# Patient Record
Sex: Female | Born: 1945
Health system: Southern US, Community
[De-identification: ages and names within clinical notes are randomized; demographics above are authoritative.]

## PROBLEM LIST (undated history)

## (undated) DIAGNOSIS — M545 Low back pain, unspecified: Secondary | ICD-10-CM

## (undated) DIAGNOSIS — G40409 Other generalized epilepsy and epileptic syndromes, not intractable, without status epilepticus: Secondary | ICD-10-CM

## (undated) DIAGNOSIS — M1990 Unspecified osteoarthritis, unspecified site: Secondary | ICD-10-CM

## (undated) DIAGNOSIS — K219 Gastro-esophageal reflux disease without esophagitis: Secondary | ICD-10-CM

## (undated) DIAGNOSIS — I517 Cardiomegaly: Secondary | ICD-10-CM

## (undated) DIAGNOSIS — J42 Unspecified chronic bronchitis: Secondary | ICD-10-CM

## (undated) DIAGNOSIS — G4733 Obstructive sleep apnea (adult) (pediatric): Secondary | ICD-10-CM

## (undated) DIAGNOSIS — R51 Headache: Secondary | ICD-10-CM

## (undated) DIAGNOSIS — Z9989 Dependence on other enabling machines and devices: Secondary | ICD-10-CM

## (undated) DIAGNOSIS — I10 Essential (primary) hypertension: Secondary | ICD-10-CM

## (undated) DIAGNOSIS — E119 Type 2 diabetes mellitus without complications: Secondary | ICD-10-CM

## (undated) DIAGNOSIS — G8929 Other chronic pain: Secondary | ICD-10-CM

## (undated) HISTORY — PX: CATARACT EXTRACTION W/ INTRAOCULAR LENS  IMPLANT, BILATERAL: SHX1307

---

## 1969-02-08 HISTORY — PX: DILATION AND CURETTAGE OF UTERUS: SHX78

## 1969-02-08 HISTORY — PX: ABDOMINAL HYSTERECTOMY: SHX81

## 2010-07-18 ENCOUNTER — Other Ambulatory Visit (HOSPITAL_BASED_OUTPATIENT_CLINIC_OR_DEPARTMENT_OTHER): Payer: Self-pay | Admitting: Internal Medicine

## 2010-07-18 ENCOUNTER — Ambulatory Visit (HOSPITAL_BASED_OUTPATIENT_CLINIC_OR_DEPARTMENT_OTHER)
Admission: RE | Admit: 2010-07-18 | Discharge: 2010-07-18 | Disposition: A | Discharge status: Home / Self Care | Payer: Medicaid Other | Source: Ambulatory Visit | Attending: Internal Medicine | Admitting: Internal Medicine

## 2010-07-18 DIAGNOSIS — R079 Chest pain, unspecified: Secondary | ICD-10-CM | POA: Insufficient documentation

## 2010-07-18 DIAGNOSIS — I2789 Other specified pulmonary heart diseases: Secondary | ICD-10-CM | POA: Insufficient documentation

## 2010-07-18 DIAGNOSIS — R634 Abnormal weight loss: Secondary | ICD-10-CM | POA: Insufficient documentation

## 2010-07-18 DIAGNOSIS — R05 Cough: Secondary | ICD-10-CM

## 2010-07-18 DIAGNOSIS — R0602 Shortness of breath: Secondary | ICD-10-CM | POA: Insufficient documentation

## 2010-07-18 DIAGNOSIS — R059 Cough, unspecified: Secondary | ICD-10-CM | POA: Insufficient documentation

## 2010-07-18 MED ORDER — IOHEXOL 350 MG/ML SOLN
100.0000 mL | Freq: Once | INTRAVENOUS | Status: AC | PRN
  Administered 2010-07-18: 100 mL via INTRAVENOUS

## 2011-06-18 ENCOUNTER — Encounter: Payer: Self-pay | Admitting: *Deleted

## 2011-06-18 ENCOUNTER — Observation Stay (HOSPITAL_BASED_OUTPATIENT_CLINIC_OR_DEPARTMENT_OTHER)
Admission: EM | Admit: 2011-06-18 | Discharge: 2011-06-21 | Disposition: A | Discharge status: Home / Self Care | Payer: Self-pay | Source: Ambulatory Visit | Attending: Internal Medicine | Admitting: Internal Medicine

## 2011-06-18 ENCOUNTER — Emergency Department (INDEPENDENT_AMBULATORY_CARE_PROVIDER_SITE_OTHER): Payer: Self-pay

## 2011-06-18 DIAGNOSIS — R10814 Left lower quadrant abdominal tenderness: Secondary | ICD-10-CM | POA: Insufficient documentation

## 2011-06-18 DIAGNOSIS — N281 Cyst of kidney, acquired: Secondary | ICD-10-CM

## 2011-06-18 DIAGNOSIS — G8929 Other chronic pain: Secondary | ICD-10-CM | POA: Insufficient documentation

## 2011-06-18 DIAGNOSIS — M545 Low back pain, unspecified: Principal | ICD-10-CM | POA: Insufficient documentation

## 2011-06-18 DIAGNOSIS — G40909 Epilepsy, unspecified, not intractable, without status epilepticus: Secondary | ICD-10-CM | POA: Diagnosis present

## 2011-06-18 DIAGNOSIS — Z794 Long term (current) use of insulin: Secondary | ICD-10-CM | POA: Insufficient documentation

## 2011-06-18 DIAGNOSIS — R739 Hyperglycemia, unspecified: Secondary | ICD-10-CM

## 2011-06-18 DIAGNOSIS — M549 Dorsalgia, unspecified: Secondary | ICD-10-CM

## 2011-06-18 DIAGNOSIS — R1032 Left lower quadrant pain: Secondary | ICD-10-CM

## 2011-06-18 DIAGNOSIS — I10 Essential (primary) hypertension: Secondary | ICD-10-CM | POA: Insufficient documentation

## 2011-06-18 DIAGNOSIS — E119 Type 2 diabetes mellitus without complications: Secondary | ICD-10-CM | POA: Diagnosis present

## 2011-06-18 DIAGNOSIS — IMO0001 Reserved for inherently not codable concepts without codable children: Secondary | ICD-10-CM | POA: Insufficient documentation

## 2011-06-18 DIAGNOSIS — E669 Obesity, unspecified: Secondary | ICD-10-CM | POA: Insufficient documentation

## 2011-06-18 DIAGNOSIS — D649 Anemia, unspecified: Secondary | ICD-10-CM | POA: Insufficient documentation

## 2011-06-18 DIAGNOSIS — J9819 Other pulmonary collapse: Secondary | ICD-10-CM

## 2011-06-18 HISTORY — DX: Essential (primary) hypertension: I10

## 2011-06-18 LAB — COMPREHENSIVE METABOLIC PANEL
CO2: 26 mEq/L (ref 19–32)
Calcium: 9.7 mg/dL (ref 8.4–10.5)
Creatinine, Ser: 0.9 mg/dL (ref 0.50–1.10)
GFR calc Af Amer: 77 mL/min — ABNORMAL LOW (ref >90)
GFR calc non Af Amer: 67 mL/min — ABNORMAL LOW (ref >90)
Glucose, Bld: 243 mg/dL — ABNORMAL HIGH (ref 70–99)
Sodium: 137 mEq/L (ref 135–145)
Total Protein: 6.8 g/dL (ref 6.0–8.3)

## 2011-06-18 LAB — LIPASE, BLOOD: Lipase: 23 U/L (ref 11–59)

## 2011-06-18 LAB — DIFFERENTIAL
Eosinophils Absolute: 0.2 10*3/uL (ref 0.0–0.7)
Eosinophils Relative: 2 % (ref 0–5)
Lymphocytes Relative: 22 % (ref 12–46)
Lymphs Abs: 2.4 10*3/uL (ref 0.7–4.0)
Monocytes Absolute: 0.7 10*3/uL (ref 0.1–1.0)

## 2011-06-18 LAB — URINALYSIS, ROUTINE W REFLEX MICROSCOPIC
Ketones, ur: NEGATIVE mg/dL
Protein, ur: NEGATIVE mg/dL
Urobilinogen, UA: 0.2 mg/dL (ref 0.0–1.0)

## 2011-06-18 LAB — CBC
HCT: 38 % (ref 36.0–46.0)
MCH: 28.4 pg (ref 26.0–34.0)
MCV: 85.8 fL (ref 78.0–100.0)
Platelets: 270 10*3/uL (ref 150–400)
RDW: 14.4 % (ref 11.5–15.5)
WBC: 11.3 10*3/uL — ABNORMAL HIGH (ref 4.0–10.5)

## 2011-06-18 LAB — URINE MICROSCOPIC-ADD ON

## 2011-06-18 MED ORDER — MORPHINE SULFATE 4 MG/ML IJ SOLN
4.0000 mg | Freq: Once | INTRAMUSCULAR | Status: AC
  Administered 2011-06-18: 4 mg via INTRAVENOUS
  Filled 2011-06-18: qty 1

## 2011-06-18 MED ORDER — SODIUM CHLORIDE 0.9 % IV BOLUS (SEPSIS)
1000.0000 mL | Freq: Once | INTRAVENOUS | Status: AC
  Administered 2011-06-18: 1000 mL via INTRAVENOUS

## 2011-06-18 MED ORDER — IOHEXOL 300 MG/ML  SOLN
100.0000 mL | Freq: Once | INTRAMUSCULAR | Status: AC | PRN
  Administered 2011-06-18: 100 mL via INTRAVENOUS

## 2011-06-18 NOTE — ED Notes (Signed)
Patient states she has had left leg and left lower back pain for one week.  Has an appointment on 06/25/11 to be seen.  States pain became worse today.

## 2011-06-19 ENCOUNTER — Inpatient Hospital Stay (HOSPITAL_COMMUNITY): Payer: Self-pay

## 2011-06-19 ENCOUNTER — Encounter (HOSPITAL_COMMUNITY): Payer: Self-pay | Admitting: Internal Medicine

## 2011-06-19 DIAGNOSIS — G40909 Epilepsy, unspecified, not intractable, without status epilepticus: Secondary | ICD-10-CM | POA: Diagnosis present

## 2011-06-19 DIAGNOSIS — M549 Dorsalgia, unspecified: Secondary | ICD-10-CM

## 2011-06-19 DIAGNOSIS — E119 Type 2 diabetes mellitus without complications: Secondary | ICD-10-CM | POA: Diagnosis present

## 2011-06-19 LAB — COMPREHENSIVE METABOLIC PANEL
ALT: 10 U/L (ref 0–35)
AST: 13 U/L (ref 0–37)
Alkaline Phosphatase: 65 U/L (ref 39–117)
CO2: 25 mEq/L (ref 19–32)
Calcium: 9.3 mg/dL (ref 8.4–10.5)
Chloride: 106 mEq/L (ref 96–112)
GFR calc Af Amer: 83 mL/min — ABNORMAL LOW (ref >90)
GFR calc non Af Amer: 71 mL/min — ABNORMAL LOW (ref >90)
Glucose, Bld: 188 mg/dL — ABNORMAL HIGH (ref 70–99)
Potassium: 3.8 mEq/L (ref 3.5–5.1)
Sodium: 140 mEq/L (ref 135–145)
Total Bilirubin: 0.2 mg/dL — ABNORMAL LOW (ref 0.3–1.2)

## 2011-06-19 LAB — CBC
Hemoglobin: 12 g/dL (ref 12.0–15.0)
MCH: 28.6 pg (ref 26.0–34.0)
MCHC: 32.5 g/dL (ref 30.0–36.0)
RDW: 14.7 % (ref 11.5–15.5)

## 2011-06-19 LAB — GLUCOSE, CAPILLARY
Glucose-Capillary: 178 mg/dL — ABNORMAL HIGH (ref 70–99)
Glucose-Capillary: 105 mg/dL — ABNORMAL HIGH (ref 70–99)
Glucose-Capillary: 149 mg/dL — ABNORMAL HIGH (ref 70–99)

## 2011-06-19 LAB — TSH: TSH: 1.119 u[IU]/mL (ref 0.350–4.500)

## 2011-06-19 MED ORDER — MORPHINE SULFATE 4 MG/ML IJ SOLN
4.0000 mg | Freq: Once | INTRAMUSCULAR | Status: AC
  Administered 2011-06-19: 4 mg via INTRAVENOUS
  Filled 2011-06-19: qty 1

## 2011-06-19 MED ORDER — HYDROMORPHONE HCL PF 1 MG/ML IJ SOLN: 0.5000 mg | INTRAMUSCULAR | Status: DC | PRN

## 2011-06-19 MED ORDER — LEVETIRACETAM ER 500 MG PO TB24
500.0000 mg | ORAL_TABLET | Freq: Two times a day (BID) | ORAL | Status: DC
  Filled 2011-06-19 (×2): qty 1

## 2011-06-19 MED ORDER — INSULIN ASPART 100 UNIT/ML ~~LOC~~ SOLN
0.0000 [IU] | Freq: Three times a day (TID) | SUBCUTANEOUS | Status: DC
  Administered 2011-06-19: 2 [IU] via SUBCUTANEOUS
  Administered 2011-06-19 – 2011-06-20 (×3): 3 [IU] via SUBCUTANEOUS
  Administered 2011-06-21 (×2): 5 [IU] via SUBCUTANEOUS
  Filled 2011-06-19: qty 3

## 2011-06-19 MED ORDER — SODIUM CHLORIDE 0.9 % IJ SOLN
3.0000 mL | Freq: Two times a day (BID) | INTRAMUSCULAR | Status: DC
  Administered 2011-06-19 – 2011-06-21 (×4): 3 mL via INTRAVENOUS

## 2011-06-19 MED ORDER — ACETAMINOPHEN 325 MG PO TABS
650.0000 mg | ORAL_TABLET | Freq: Four times a day (QID) | ORAL | Status: DC | PRN
  Administered 2011-06-20 – 2011-06-21 (×3): 650 mg via ORAL
  Filled 2011-06-19 (×3): qty 2

## 2011-06-19 MED ORDER — OXYCODONE HCL 5 MG PO TABS
5.0000 mg | ORAL_TABLET | ORAL | Status: DC | PRN
  Administered 2011-06-19 – 2011-06-21 (×7): 5 mg via ORAL
  Filled 2011-06-19 (×7): qty 1

## 2011-06-19 MED ORDER — OXYCODONE-ACETAMINOPHEN 5-325 MG PO TABS
1.0000 | ORAL_TABLET | Freq: Once | ORAL | Status: AC
  Administered 2011-06-19: 1 via ORAL
  Filled 2011-06-19: qty 1

## 2011-06-19 MED ORDER — HYDROCHLOROTHIAZIDE 25 MG PO TABS
25.0000 mg | ORAL_TABLET | Freq: Every day | ORAL | Status: DC
  Administered 2011-06-19 – 2011-06-21 (×2): 25 mg via ORAL
  Filled 2011-06-19 (×3): qty 1

## 2011-06-19 MED ORDER — LISINOPRIL 20 MG PO TABS
20.0000 mg | ORAL_TABLET | Freq: Every day | ORAL | Status: DC
  Administered 2011-06-19 – 2011-06-21 (×2): 20 mg via ORAL
  Filled 2011-06-19 (×3): qty 1

## 2011-06-19 MED ORDER — INSULIN GLARGINE 100 UNIT/ML ~~LOC~~ SOLN
40.0000 [IU] | Freq: Every day | SUBCUTANEOUS | Status: DC
  Administered 2011-06-19 – 2011-06-20 (×2): 40 [IU] via SUBCUTANEOUS
  Filled 2011-06-19: qty 3

## 2011-06-19 MED ORDER — OXYCODONE-ACETAMINOPHEN 5-325 MG PO TABS: 1.0000 | ORAL_TABLET | ORAL | Status: DC | PRN

## 2011-06-19 MED ORDER — ONDANSETRON HCL 4 MG/2ML IJ SOLN: 4.0000 mg | Freq: Four times a day (QID) | INTRAMUSCULAR | Status: DC | PRN

## 2011-06-19 MED ORDER — CYCLOBENZAPRINE HCL 10 MG PO TABS: 5.0000 mg | ORAL_TABLET | Freq: Every evening | ORAL | Status: DC | PRN

## 2011-06-19 MED ORDER — LEVETIRACETAM 500 MG PO TABS
500.0000 mg | ORAL_TABLET | Freq: Two times a day (BID) | ORAL | Status: DC
  Administered 2011-06-19 – 2011-06-21 (×5): 500 mg via ORAL
  Filled 2011-06-19 (×6): qty 1

## 2011-06-19 MED ORDER — GABAPENTIN 100 MG PO CAPS
100.0000 mg | ORAL_CAPSULE | Freq: Three times a day (TID) | ORAL | Status: DC
  Administered 2011-06-19 – 2011-06-21 (×7): 100 mg via ORAL
  Filled 2011-06-19 (×9): qty 1

## 2011-06-19 MED ORDER — TOPIRAMATE 100 MG PO TABS
200.0000 mg | ORAL_TABLET | Freq: Two times a day (BID) | ORAL | Status: DC
Start: 2011-06-19 — End: 2011-06-21
  Administered 2011-06-19 – 2011-06-21 (×5): 200 mg via ORAL
  Filled 2011-06-19 (×6): qty 2

## 2011-06-19 MED ORDER — FAMOTIDINE 20 MG PO TABS
20.0000 mg | ORAL_TABLET | Freq: Every day | ORAL | Status: DC
  Administered 2011-06-19 – 2011-06-21 (×3): 20 mg via ORAL
  Filled 2011-06-19 (×3): qty 1

## 2011-06-19 MED ORDER — TOPIRAMATE 100 MG PO TABS
100.0000 mg | ORAL_TABLET | Freq: Two times a day (BID) | ORAL | Status: DC
  Administered 2011-06-19 – 2011-06-21 (×3): 100 mg via ORAL
  Filled 2011-06-19 (×6): qty 1

## 2011-06-19 MED ORDER — POTASSIUM CHLORIDE CRYS ER 10 MEQ PO TBCR
10.0000 meq | EXTENDED_RELEASE_TABLET | Freq: Every day | ORAL | Status: DC
  Administered 2011-06-19 – 2011-06-21 (×3): 10 meq via ORAL
  Filled 2011-06-19 (×3): qty 1

## 2011-06-19 MED ORDER — VITAMIN D3 25 MCG (1000 UNIT) PO TABS
2000.0000 [IU] | ORAL_TABLET | Freq: Every day | ORAL | Status: DC
  Administered 2011-06-19 – 2011-06-21 (×3): 2000 [IU] via ORAL
  Filled 2011-06-19 (×3): qty 2

## 2011-06-19 MED ORDER — ONDANSETRON HCL 4 MG PO TABS: 4.0000 mg | ORAL_TABLET | Freq: Four times a day (QID) | ORAL | Status: DC | PRN

## 2011-06-19 MED ORDER — ACETAMINOPHEN 650 MG RE SUPP: 650.0000 mg | Freq: Four times a day (QID) | RECTAL | Status: DC | PRN

## 2011-06-19 NOTE — Progress Notes (Signed)
   CARE MANAGEMENT NOTE 06/19/2011  Patient:  [name] [name],[name] [name]   Account Number:  [id number]  Date Initiated:  06/19/2011  Documentation initiated by:  Onnie Boer  Subjective/Objective Assessment:   PT WAS ADMITTED WITH LOWER BACK PAIN     Action/Plan:   PROGRESSION OF CARE AND DISCHARGE PLANNING   Anticipated DC Date:  06/20/2011   Anticipated DC Plan:  HOME W HOME HEALTH SERVICES      DC Planning Services  CM consult      Choice offered to / List presented to:             Status of service:  In process, will continue to follow Medicare Important Message given?   (If response is "NO", the following Medicare IM given date fields will be blank) Date Medicare IM given:   Date Additional Medicare IM given:    Discharge Disposition:    Per UR Regulation:  Reviewed for med. necessity/level of care/duration of stay  Comments:  06/19/2011 Onnie Boer, RN, BSN [phone] PT WAS ADMITTED WITH BACK PAIN.  PT TO HAVE MRI TODAY, PT/OT EVAL SUGGESTED CIR, THEY WERE CONSULTED AND WISH TO HOLD OFF UNTIL RESULTS FROM MRI ARE GIVEN.  PT HAD QUESTIONS ABOUT MED ASSISTANCE.  PT IS SEEN AT THE [facility] [place] AND HAS AN ORANGE CARD. FINANCIAL COUNCILING CONTACTED AS PT STATES THAT SHE DOESN'T KNOW HOW SHE IS GOING TO PAY FOR THIS STAY AND WAS ASKING ABOUT CHARITABLE FUNDS.  THEY STATED THAT THEY WOULD CALL THE PT'S ROOM.  WILL F/U.

## 2011-06-19 NOTE — Progress Notes (Signed)
Physical medicine and rehabilitation consulted with chart reviewed. Noted plan for MRI of the lumbar spine to rule out nerve compression versus disc disorder. Will await MRI results to evaluate any need for surgical intervention. Physical therapy evaluation has been completed. Will followup in a.m.

## 2011-06-19 NOTE — ED Notes (Signed)
Pt was unable to ambulate. Possible admission discussed with pt and her daughter by Dr Hyman Hopes.

## 2011-06-19 NOTE — Progress Notes (Signed)
SPOKE WITH PT AND DAUGHTER AND THEY STATE THAT PT IS SEEN AT [facility] SINCE June 2012.  WILL RESEARCH PHARMACY AND PRICING OF MEDS TO SEE IF THERE ARE ANY OTHER RESOURCES.  WILL F/U WITH PT. Willa Rough [phone] OR [phone] 06/19/2011

## 2011-06-19 NOTE — H&P (Signed)
[name] [name] is an 66 y.o. female. PCP - [facility] [place].   Chief Complaint: Low back pain. HPI: 66 year-old female with history of diabetes mellitus2, seizure disorder since birth, hypertension has been experiencing increasing low back pain since a week now. The back pain radiates to her left leg and finds difficult to do her routine work. Denies any fall or trauma. Patient presented herself to the ER at [facility]. Despite giving her pain relief medications patient still had pain and has been admitted for further management. [place] the [facility] patient also had CAT scan abdomen and pelvis which does not show anything acute. Patient denies any chest pain shortness of breath nausea vomiting or abdominal pain dysuria or discharges. Patient has not had any urinary or bowel incontinence. Patient did not lose function of lower extremities.  Past Medical History  Diagnosis Date  . Hypertension   . Bronchitis   . Reflux   . Diabetes mellitus   . Seizures     Past Surgical History  Procedure Date  . Abdominal hysterectomy     Family History  Problem Relation Age of Onset  . Diabetes type II Sister   . Coronary artery disease Sister    Social History:  reports that she has never smoked. She does not have any smokeless tobacco history on file. She reports that she does not drink alcohol or use illicit drugs.  Allergies: No Known Allergies  Medications Prior to Admission  Medication Dose Route Frequency Provider Last Rate Last Dose  . acetaminophen (TYLENOL) tablet 650 mg  650 mg Oral Q6H PRN Eduard Clos       Or  . acetaminophen (TYLENOL) suppository 650 mg  650 mg Rectal Q6H PRN Eduard Clos      . cholecalciferol (VITAMIN D) tablet 2,000 Units  2,000 Units Oral Daily Eduard Clos      . famotidine (PEPCID) tablet 20 mg  20 mg Oral Daily Eduard Clos      . gabapentin (NEURONTIN) capsule 100 mg  100 mg Oral TID  Eduard Clos      . hydrochlorothiazide (HYDRODIURIL) tablet 25 mg  25 mg Oral Daily Eduard Clos      . HYDROmorphone (DILAUDID) injection 0.5 mg  0.5 mg Intravenous Q4H PRN Eduard Clos      . insulin glargine (LANTUS) injection 40 Units  40 Units Subcutaneous QHS Eduard Clos      . iohexol (OMNIPAQUE) 300 MG/ML solution 100 mL  100 mL Intravenous Once PRN Medication Radiologist   100 mL at 06/18/11 2203  . levETIRAcetam (KEPPRA XR) 24 hr tablet 500 mg  500 mg Oral BID Eduard Clos      . lisinopril (PRINIVIL,ZESTRIL) tablet 20 mg  20 mg Oral Daily Eduard Clos      . morphine 4 MG/ML injection 4 mg  4 mg Intravenous Once Forbes Cellar, MD   4 mg at 06/18/11 2101  . morphine 4 MG/ML injection 4 mg  4 mg Intravenous Once Forbes Cellar, MD   4 mg at 06/19/11 0040  . ondansetron (ZOFRAN) tablet 4 mg  4 mg Oral Q6H PRN Eduard Clos       Or  . ondansetron (ZOFRAN) injection 4 mg  4 mg Intravenous Q6H PRN Eduard Clos      . oxyCODONE (Oxy IR/ROXICODONE) immediate release tablet 5 mg  5 mg Oral  Q4H PRN Eduard Clos      . oxyCODONE-acetaminophen (PERCOCET) 5-325 MG per tablet 1 tablet  1 tablet Oral Once Forbes Cellar, MD   1 tablet at 06/19/11 0015  . potassium chloride (K-DUR,KLOR-CON) CR tablet 10 mEq  10 mEq Oral Daily Eduard Clos      . sodium chloride 0.9 % bolus 1,000 mL  1,000 mL Intravenous Once Forbes Cellar, MD   1,000 mL at 06/18/11 2100  . sodium chloride 0.9 % injection 3 mL  3 mL Intravenous Q12H Eduard Clos      . topiramate (TOPAMAX) tablet 100 mg  100 mg Oral BID Eduard Clos      . topiramate (TOPAMAX) tablet 200 mg  200 mg Oral BID Eduard Clos       Medications Prior to Admission  Medication Sig Dispense Refill  . cyclobenzaprine (FLEXERIL) 10 MG tablet Take 0.5 tablets (5 mg total) by mouth at bedtime as needed for muscle spasms.  10 tablet  0  .  oxyCODONE-acetaminophen (PERCOCET) 5-325 MG per tablet Take 1 tablet by mouth every 4 (four) hours as needed for pain.  20 tablet  0    Results for orders placed during the hospital encounter of 06/18/11 (from the past 48 hour(s))  URINALYSIS, ROUTINE W REFLEX MICROSCOPIC     Status: Abnormal   Collection Time   06/18/11  5:50 PM      Component Value Range Comment   Color, Urine YELLOW  YELLOW     APPearance CLEAR  CLEAR     Specific Gravity, Urine 1.011  1.005 - 1.030     pH 5.0  5.0 - 8.0     Glucose, UA NEGATIVE  NEGATIVE (mg/dL)    Hgb urine dipstick NEGATIVE  NEGATIVE     Bilirubin Urine NEGATIVE  NEGATIVE     Ketones, ur NEGATIVE  NEGATIVE (mg/dL)    Protein, ur NEGATIVE  NEGATIVE (mg/dL)    Urobilinogen, UA 0.2  0.0 - 1.0 (mg/dL)    Nitrite NEGATIVE  NEGATIVE     Leukocytes, UA TRACE (*) NEGATIVE    URINE MICROSCOPIC-ADD ON     Status: Normal   Collection Time   06/18/11  5:50 PM      Component Value Range Comment   Squamous Epithelial / LPF RARE  RARE     WBC, UA 0-2  <3 (WBC/hpf)    Bacteria, UA RARE  RARE    CBC     Status: Abnormal   Collection Time   06/18/11  9:07 PM      Component Value Range Comment   WBC 11.3 (*) 4.0 - 10.5 (K/uL)    RBC 4.43  3.87 - 5.11 (MIL/uL)    Hemoglobin 12.6  12.0 - 15.0 (g/dL)    HCT 16.1  09.6 - 04.5 (%)    MCV 85.8  78.0 - 100.0 (fL)    MCH 28.4  26.0 - 34.0 (pg)    MCHC 33.2  30.0 - 36.0 (g/dL)    RDW 40.9  81.1 - 91.4 (%)    Platelets 270  150 - 400 (K/uL)   DIFFERENTIAL     Status: Abnormal   Collection Time   06/18/11  9:07 PM      Component Value Range Comment   Neutrophils Relative 70  43 - 77 (%)    Neutro Abs 7.9 (*) 1.7 - 7.7 (K/uL)    Lymphocytes Relative 22  12 - 46 (%)  Lymphs Abs 2.4  0.7 - 4.0 (K/uL)    Monocytes Relative 6  3 - 12 (%)    Monocytes Absolute 0.7  0.1 - 1.0 (K/uL)    Eosinophils Relative 2  0 - 5 (%)    Eosinophils Absolute 0.2  0.0 - 0.7 (K/uL)    Basophils Relative 0  0 - 1 (%)    Basophils  Absolute 0.0  0.0 - 0.1 (K/uL)   COMPREHENSIVE METABOLIC PANEL     Status: Abnormal   Collection Time   06/18/11  9:07 PM      Component Value Range Comment   Sodium 137  135 - 145 (mEq/L)    Potassium 4.0  3.5 - 5.1 (mEq/L)    Chloride 102  96 - 112 (mEq/L)    CO2 26  19 - 32 (mEq/L)    Glucose, Bld 243 (*) 70 - 99 (mg/dL)    BUN 21  6 - 23 (mg/dL)    Creatinine, Ser 5.40  0.50 - 1.10 (mg/dL)    Calcium 9.7  8.4 - 10.5 (mg/dL)    Total Protein 6.8  6.0 - 8.3 (g/dL)    Albumin 3.3 (*) 3.5 - 5.2 (g/dL)    AST 15  0 - 37 (U/L)    ALT 11  0 - 35 (U/L)    Alkaline Phosphatase 75  39 - 117 (U/L)    Total Bilirubin 0.1 (*) 0.3 - 1.2 (mg/dL)    GFR calc non Af Amer 67 (*) >90 (mL/min)    GFR calc Af Amer 77 (*) >90 (mL/min)   LIPASE, BLOOD     Status: Normal   Collection Time   06/18/11  9:07 PM      Component Value Range Comment   Lipase 23  11 - 59 (U/L)   GLUCOSE, CAPILLARY     Status: Abnormal   Collection Time   06/19/11  6:07 AM      Component Value Range Comment   Glucose-Capillary 178 (*) 70 - 99 (mg/dL)    Ct Abdomen Pelvis W Contrast  06/18/2011  *RADIOLOGY REPORT*  Clinical Data: Left lower quadrant pain.  Left-sided back pain.  CT ABDOMEN AND PELVIS WITH CONTRAST  Technique:  Multidetector CT imaging of the abdomen and pelvis was performed following the standard protocol during bolus administration of intravenous contrast.  Contrast: OMNIPAQUE IOHEXOL 300 MG/ML IV SOLN  Comparison: None.  Findings: Dependent atelectasis at the lung bases.  Patulous gastroesophageal junction.  Contrast outlines food within the stomach.  The stomach is distended.  Right interpolar simple renal cyst measures 14 mm.  Normal renal enhancement and excretion of contrast.  Both adrenal glands appear within normal limits.  Pancreas is unremarkable aside from mild fatty atrophy of the uncinate.  No calcified gallstones.  Gallbladder appears within normal limits by CT.  Spleen normal.  Duodenum within  normal limits.  Small bowel appears normal without evidence of obstruction.  Hysterectomy. Urinary bladder is normal.  Mild colonic diverticulosis.  There is no colonic diverticulitis.  Normal appendix.  No aggressive osseous lesions.  Exaggerated lumbar lordosis.  Lower lumbar facet arthrosis.  Degenerative disc disease worst at L5-S1. Small radiolucent lesion in the right side of L5 probably represents hemangioma.  The bilateral SI joint degenerative disease.  IMPRESSION:  1.  No acute abnormality. 2.  Right renal cyst. 3.  Hysterectomy. 4.  Lumbar spondylosis and facet arthrosis.  Original Report Authenticated By: Andreas Newport, M.D.    Review of  Systems  Constitutional: Negative.   HENT: Negative.   Eyes: Negative.   Respiratory: Negative.   Cardiovascular: Negative.   Gastrointestinal: Negative.   Genitourinary: Negative.   Musculoskeletal: Positive for back pain.  Skin: Negative.   Neurological: Negative.   Endo/Heme/Allergies: Negative.   Psychiatric/Behavioral: Negative.     Blood pressure 111/57, pulse 70, temperature 97.6 F (36.4 C), temperature source Oral, resp. rate 18, height 5\' 1"  (1.549 m), weight 108.863 kg (240 lb), SpO2 95.00%. Physical Exam  Constitutional: She is oriented to person, place, and time. She appears well-developed. No distress.       Obese.  HENT:  Head: Normocephalic and atraumatic.  Right Ear: External ear normal.  Left Ear: External ear normal.  Nose: Nose normal.  Mouth/Throat: Oropharynx is clear and moist. No oropharyngeal exudate.  Eyes: Conjunctivae are normal. Pupils are equal, round, and reactive to light. Right eye exhibits no discharge. Left eye exhibits no discharge. No scleral icterus.  Neck: Normal range of motion. Neck supple.  Cardiovascular: Normal rate, regular rhythm and normal heart sounds.   Respiratory: Effort normal and breath sounds normal. No respiratory distress. She has no wheezes. She has no rales.  GI: Soft. Bowel  sounds are normal. She exhibits no distension. There is no tenderness. There is no rebound.  Musculoskeletal: She exhibits no edema.       Has low back pain when she raises her left leg. Has good strength in both legs.  Neurological: She is alert and oriented to person, place, and time.       Moves upper and lower extremities.  Skin: She is not diaphoretic.     Assessment/Plan #1. Low back pain - she has pain on raising her leg. Patient probably has nerve compression from causes probably like disc prolapse. We will get an MRI of the lumbar spine. We'll get PTOT consult and place patient on pain relief medications. #2. History of diabetes mellitus type 2, seizure disorder, hypertension - continue present medications. Patient did miss her Lantus dose last night. Patient will be placed on sliding scale coverage.   CODE STATUS - full code.  Eduard Clos 06/19/2011, 6:37 AM

## 2011-06-19 NOTE — Progress Notes (Signed)
Subjective: Chart reviewed. Continues to have left sided lower back pain. She says that she's been having this pain for the last week and a half. She has not seen a spine surgeon or had any back procedures in the past. According to patient's nursing, patient was able to weight bear with one-person assist. She also complains of urinary frequency but no dysuria. No tingling or numbness of the lower extremities. No sphincter disturbance.  Objective: Blood pressure 138/71, pulse 73, temperature 97.7 F (36.5 C), temperature source Oral, resp. rate 18, height 5\' 1"  (1.549 m), weight 108.863 kg (240 lb), SpO2 100.00%.  Intake/Output Summary (Last 24 hours) at 06/19/11 1902 Last data filed at 06/19/11 1300  Gross per 24 hour  Intake    480 ml  Output    200 ml  Net    280 ml    General Exam: Comfortable. Overweight Respiratory System: Clear. No increased work of breathing. Cardiovascular System: First and second heart sounds heard. Regular rate and rhythm. No JVD/murmurs. Gastrointestinal System: Abdomen is non distended, soft and normal bowel sounds heard. Nontender Central Nervous System: Alert and oriented. No focal neurological deficits. Extremities: Grade 5 x 5 power symmetrically. However limitation in assessment of power in the left lower extremity which is limited by pain.  Lab Results: Basic Metabolic Panel:  Basename 06/19/11 0705 06/18/11 2107  NA 140 137  K 3.8 4.0  CL 106 102  CO2 25 26  GLUCOSE 188* 243*  BUN 19 21  CREATININE 0.85 0.90  CALCIUM 9.3 9.7  MG -- --  PHOS -- --   Liver Function Tests:  [facility] 06/19/11 0705 06/18/11 2107  AST 13 15  ALT 10 11  ALKPHOS 65 75  BILITOT 0.2* 0.1*  PROT 6.4 6.8  ALBUMIN 2.9* 3.3*    Basename 06/18/11 2107  LIPASE 23  AMYLASE --   No results found for this basename: AMMONIA:2 in the last 72 hours CBC:  Basename 06/19/11 0705 06/18/11 2107  WBC 9.2 11.3*  NEUTROABS -- 7.9*  HGB 12.0 12.6  HCT 36.9 38.0  MCV  87.9 85.8  PLT 256 270   CBG:  Basename 06/19/11 1615 06/19/11 1307 06/19/11 0607  GLUCAP 105* 141* 178*   Hemoglobin A1C:  Basename 06/19/11 0705  HGBA1C 9.7*   Fasting Lipid Panel: No results found for this basename: CHOL,HDL,LDLCALC,TRIG,CHOLHDL,LDLDIRECT in the last 72 hours Thyroid Function Tests:  Basename 06/19/11 0705  TSH 1.119  T4TOTAL --  FREET4 --  T3FREE --  THYROIDAB --    Studies/Results: Mr Lumbar Spine Wo Contrast  06/19/2011  *RADIOLOGY REPORT*  Clinical Data: Chronic low back pain with bilateral leg pain and weakness.  No known injury or prior relevant surgery.  MRI LUMBAR SPINE WITHOUT CONTRAST   IMPRESSION:  1.  Transitional lumbosacral anatomy with a transitional L5 segment and left-sided lumbosacral assimilation joint. 2.  Moderate facet hypertrophy, mild disc bulging and a slight anterolisthesis at L4-L5 contribute to mild biforaminal stenosis and possible left L4 nerve root encroachment. 3.  No other significant spinal stenosis or nerve root encroachment. 4.  No acute osseous findings.  Original Report Authenticated By: Gerrianne Scale, M.D.   Ct Abdomen Pelvis W Contrast  06/18/2011  *RADIOLOGY REPORT*  Clinical Data: Left lower quadrant pain.  Left-sided back pain.  CT ABDOMEN AND PELVIS WITH CONTRAST   IMPRESSION:  1.  No acute abnormality. 2.  Right renal cyst. 3.  Hysterectomy. 4.  Lumbar spondylosis and facet arthrosis.  Original Report  Authenticated By: Andreas Newport, M.D.    Medications: Scheduled Meds:   . cholecalciferol  2,000 Units Oral Daily  . famotidine  20 mg Oral Daily  . gabapentin  100 mg Oral TID  . hydrochlorothiazide  25 mg Oral Daily  . insulin aspart  0-15 Units Subcutaneous TID WC  . insulin glargine  40 Units Subcutaneous QHS  . levETIRAcetam  500 mg Oral BID  . lisinopril  20 mg Oral Daily  .  morphine injection  4 mg Intravenous Once  .  morphine injection  4 mg Intravenous Once  . oxyCODONE-acetaminophen  1 tablet  Oral Once  . potassium chloride  10 mEq Oral Daily  . sodium chloride  1,000 mL Intravenous Once  . sodium chloride  3 mL Intravenous Q12H  . topiramate  100 mg Oral BID  . topiramate  200 mg Oral BID  . DISCONTD: levETIRAcetam  500 mg Oral BID   Continuous Infusions:  PRN Meds:.acetaminophen, acetaminophen, HYDROmorphone, iohexol, ondansetron (ZOFRAN) IV, ondansetron, oxyCODONE  Assessment/Plan: 1. Acute on chronic low back pain:? Secondary to degenerative joint disease, mild disc bulging and spinal stenosis. Will discuss with orthopedic/spine M.D. tomorrow. Continue pain medications and physical therapy and occupational therapy and rehabilitation eval. 2. Uncontrolled type 2 diabetes mellitus, as evidenced by hemoglobin A1c of 9.7. Inpatient control is fair. 3. Hypertension: Controlled. Continue current medications 4. History of seizure disorder: Continue Keppra    HONGALGI,ANAND 06/19/2011, 7:02 PM

## 2011-06-19 NOTE — Progress Notes (Signed)
Inpatient Diabetes Program Recommendations  AACE/ADA: New Consensus Statement on Inpatient Glycemic Control (2009)  Target Ranges:  Prepandial:   less than 140 mg/dL      Peak postprandial:   less than 180 mg/dL (1-2 hours)      Critically ill patients:  140 - 180 mg/dL   Reason for Visit:   Results for ELENE, DOWNUM (MRN [id number]) as of 06/19/2011 13:06  Ref. Range 06/18/2011 21:07 06/19/2011 06:07 06/19/2011 07:05  Glucose-Capillary Latest Range: 70-99 mg/dL  409 (H)   Glucose Latest Range: 70-99 mg/dL 811 (H)  914 (H)    Inpatient Diabetes Program Recommendations HgbA1C: Please order HgbA1C to assess glycemic control at home  Note:

## 2011-06-19 NOTE — ED Notes (Signed)
See paper progress notes for documentation of pt care during downtime.

## 2011-06-19 NOTE — ED Provider Notes (Signed)
History     CSN: [id number]  Arrival date & time 06/18/11  1703   First MD Initiated Contact with Patient 06/18/11 1927      Chief Complaint  Patient presents with  . Back Pain    left leg and back pain  . Abdominal Pain    (Consider location/radiation/quality/duration/timing/severity/associated sxs/prior treatment) HPI  65yoF hypertension, insulin-dependent diabetes presents with back pain. Patient complains of one week of left lower back pain. She states the pain has been constant and now worsening to a 9/10 at this time. She actually has experienced left lower extremity pain at her heel and her shin, calf for the past several days. She feels that this is unrelated to her back pain. Denies any radiation of the pain down the back of her leg. She states that she feels diffusely weak. She denies fever, chills. She denies abdominal pain, nausea, vomiting. No constipation, diarrhea. Denies fever/chills. Denies hematuria/dysuria/freq/urgency. No saddle anesthesia. She denies blunt trauma. She denies history of HIV, IV drug abuse. There is no bladder or bowel incontinence. No history of chronic back pain.    Past Medical History  Diagnosis Date  . Hypertension   . Bronchitis   . Reflux   . Diabetes mellitus   . Seizures     Past Surgical History  Procedure Date  . Abdominal hysterectomy     Family History  Problem Relation Age of Onset  . Diabetes type II Sister   . Coronary artery disease Sister     History  Substance Use Topics  . Smoking status: Never Smoker   . Smokeless tobacco: Not on file  . Alcohol Use: No    OB History    Grav Para Term Preterm Abortions TAB SAB Ect Mult Living                  Review of Systems  All other systems reviewed and are negative.   except as noted HPI  Allergies  Review of patient's allergies indicates no known allergies.  Home Medications   Current Outpatient Rx  Name Route Sig Dispense Refill  . CYCLOBENZAPRINE HCL  10 MG PO TABS Oral Take 0.5 tablets (5 mg total) by mouth at bedtime as needed for muscle spasms. 10 tablet 0  . OXYCODONE-ACETAMINOPHEN 5-325 MG PO TABS Oral Take 1 tablet by mouth every 4 (four) hours as needed for pain. 20 tablet 0    BP 118/72  Pulse 63  Temp(Src) 97.2 F (36.2 C) (Oral)  Resp 18  Ht 5\' 1"  (1.549 m)  Wt 240 lb (108.863 kg)  BMI 45.35 kg/m2  SpO2 95%  Physical Exam  Nursing note and vitals reviewed. Constitutional: She is oriented to person, place, and time. She appears well-developed.  HENT:  Head: Atraumatic.  Mouth/Throat: Oropharynx is clear and moist.  Eyes: Conjunctivae and EOM are normal. Pupils are equal, round, and reactive to light.  Neck: Normal range of motion. Neck supple.  Cardiovascular: Normal rate, regular rhythm, normal heart sounds and intact distal pulses.   Pulmonary/Chest: Effort normal and breath sounds normal. No respiratory distress. She has no wheezes. She has no rales.  Abdominal: Soft. She exhibits no distension. There is tenderness. There is no rebound and no guarding.       +LLQ ttp no r/g  Musculoskeletal: Normal range of motion.       No midline thoracic or cervical spine ttp +lumbar midline and Lt paraspinal ttp Straight leg raise neg b/l Hips full ROM  without pain  No edema or ttp LLE  Neurological: She is alert and oriented to person, place, and time. No cranial nerve deficit. She exhibits normal muscle tone.       Strength 4+/5 b/l LE-- limited by pain?  Skin: Skin is warm and dry. No rash noted.  Psychiatric: She has a normal mood and affect.    ED Course  Procedures (including critical care time)  Labs Reviewed  URINALYSIS, ROUTINE W REFLEX MICROSCOPIC - Abnormal; Notable for the following:    Leukocytes, UA TRACE (*)    All other components within normal limits  CBC - Abnormal; Notable for the following:    WBC 11.3 (*)    All other components within normal limits  DIFFERENTIAL - Abnormal; Notable for the  following:    Neutro Abs 7.9 (*)    All other components within normal limits  COMPREHENSIVE METABOLIC PANEL - Abnormal; Notable for the following:    Glucose, Bld 243 (*)    Albumin 3.3 (*)    Total Bilirubin 0.1 (*)    GFR calc non Af Amer 67 (*)    GFR calc Af Amer 77 (*)    All other components within normal limits  GLUCOSE, CAPILLARY - Abnormal; Notable for the following:    Glucose-Capillary 178 (*)    All other components within normal limits  URINE MICROSCOPIC-ADD ON  LIPASE, BLOOD  CBC  COMPREHENSIVE METABOLIC PANEL  TSH  POCT CBG MONITORING  HEMOGLOBIN A1C  POCT CBG MONITORING   Ct Abdomen Pelvis W Contrast  06/18/2011  *RADIOLOGY REPORT*  Clinical Data: Left lower quadrant pain.  Left-sided back pain.  CT ABDOMEN AND PELVIS WITH CONTRAST  Technique:  Multidetector CT imaging of the abdomen and pelvis was performed following the standard protocol during bolus administration of intravenous contrast.  Contrast: OMNIPAQUE IOHEXOL 300 MG/ML IV SOLN  Comparison: None.  Findings: Dependent atelectasis at the lung bases.  Patulous gastroesophageal junction.  Contrast outlines food within the stomach.  The stomach is distended.  Right interpolar simple renal cyst measures 14 mm.  Normal renal enhancement and excretion of contrast.  Both adrenal glands appear within normal limits.  Pancreas is unremarkable aside from mild fatty atrophy of the uncinate.  No calcified gallstones.  Gallbladder appears within normal limits by CT.  Spleen normal.  Duodenum within normal limits.  Small bowel appears normal without evidence of obstruction.  Hysterectomy. Urinary bladder is normal.  Mild colonic diverticulosis.  There is no colonic diverticulitis.  Normal appendix.  No aggressive osseous lesions.  Exaggerated lumbar lordosis.  Lower lumbar facet arthrosis.  Degenerative disc disease worst at L5-S1. Small radiolucent lesion in the right side of L5 probably represents hemangioma.  The bilateral SI  joint degenerative disease.  IMPRESSION:  1.  No acute abnormality. 2.  Right renal cyst. 3.  Hysterectomy. 4.  Lumbar spondylosis and facet arthrosis.  Original Report Authenticated By: Andreas Newport, M.D.     1. Back pain   2. Abdominal pain   3. Hyperglycemia      MDM  Presents with left-sided back pain. She is a vague historian. She also has left lower quadrant abdominal tenderness to palpation. Very mild leukocytosis noted on labs. Diagnosis includes intra-abdominal, musculoskeletal, malignancy less likely, epidural abscess less likely. She is neurovascularly intact at this time. CT abdomen and pelvis with lumbar reconstructions unremarkable for acute intra-abdominal or lumbar process for pain. The patient given morphine and her pain is down to 6/10. Ambulation  attempted inpatient stay she is feeling dizzy and questions whether or not she is weak in her lower extremities versus having increased pain in her back with walking. SHe has been given option for admission for observation, pain control and possible MRI. She does not want to be admitted at this time and requesting attempt for pain control in the emergency department first as she continues to have significant pain. Additional morphine ordered.   Patient signed out to Dr. Juleen China during epic downtime.        Forbes Cellar, MD 06/19/11 [id number]

## 2011-06-19 NOTE — ED Notes (Signed)
Attempted to ambulate pt without success. States her legs are weak.

## 2011-06-19 NOTE — Progress Notes (Signed)
Physical Therapy Evaluation Patient Details Name: [name] [name] MRN: [id number] DOB: 25-Mar-1946 Today's Date: 06/19/2011  Problem List:  Patient Active Problem List  Diagnoses  . Back pain  . Diabetes mellitus  . Seizure disorder    Past Medical History:  Past Medical History  Diagnosis Date  . Hypertension   . Bronchitis   . Reflux   . Diabetes mellitus   . Seizures    Past Surgical History:  Past Surgical History  Procedure Date  . Abdominal hysterectomy     PT Assessment/Plan/Recommendation PT Assessment Clinical Impression Statement: Pt is 66 y/o female admitted for back pain and gait abnormality.  Limited eval due to pain and just received pain meds.  Pt will benefit from acute PT services to improve strength, mobility, gait and overall balance to prepare for safe d/c to next venue of care.  PT Recommendation/Assessment: Patient will need skilled PT in the acute care venue PT Problem List: Decreased strength;Decreased activity tolerance;Decreased balance;Decreased mobility;Decreased knowledge of use of DME;Pain PT Therapy Diagnosis : Difficulty walking;Abnormality of gait;Generalized weakness;Acute pain PT Plan PT Frequency: Min 3X/week PT Treatment/Interventions: DME instruction;Gait training;Stair training;Functional mobility training;Therapeutic activities;Therapeutic exercise;Balance training;Neuromuscular re-education;Patient/family education PT Recommendation Follow Up Recommendations: Inpatient Rehab Equipment Recommended: Rolling walker with 5" wheels;3 in 1 bedside comode PT Goals  Acute Rehab PT Goals PT Goal Formulation: With patient/family Time For Goal Achievement: 2 weeks Pt will Roll Supine to Left Side: with modified independence PT Goal: Rolling Supine to Left Side - Progress: Not met Pt will go Supine/Side to Sit: with modified independence PT Goal: Supine/Side to Sit - Progress: Not met Pt will go Sit to Supine/Side: with modified independence PT  Goal: Sit to Supine/Side - Progress: Not met Pt will go Sit to Stand: with supervision PT Goal: Sit to Stand - Progress: Not met Pt will go Stand to Sit: with supervision PT Goal: Stand to Sit - Progress: Not met Pt will Transfer Bed to Chair/Chair to Bed: with min assist PT Transfer Goal: Bed to Chair/Chair to Bed - Progress: Not met Pt will Stand: with supervision;3 - 5 min;with bilateral upper extremity support PT Goal: Stand - Progress: Not met Pt will Ambulate: 51 - 150 feet;with min assist;with rolling walker PT Goal: Ambulate - Progress: Not met Pt will Go Up / Down Stairs: 1-2 stairs;with min assist;with least restrictive assistive device PT Goal: Up/Down Stairs - Progress: Not met  PT Evaluation Precautions/Restrictions  Precautions Precautions: Fall;Back Precaution Booklet Issued: Yes (comment) (Back handout) Restrictions Weight Bearing Restrictions: No Prior Functioning  Home Living Lives With: Family Receives Help From: Family Type of Home: House Home Layout: One level Home Access: Stairs to enter Entrance Stairs-Rails: Right Entrance Stairs-Number of Steps: 2 Bathroom Shower/Tub: Engineer, manufacturing systems: Standard Bathroom Accessibility: Yes How Accessible: Accessible via walker Home Adaptive Equipment: None Prior Function Level of Independence: Independent with basic ADLs;Independent with gait;Independent with transfers Able to Take Stairs?: Yes Driving: Yes Cognition Cognition Arousal/Alertness: Awake/alert Overall Cognitive Status: Appears within functional limits for tasks assessed Sensation/Coordination Sensation Light Touch: Appears Intact Proprioception: Appears Intact Extremity Assessment RUE Assessment RUE Assessment: Within Functional Limits LUE Assessment LUE Assessment: Within Functional Limits RLE Assessment RLE Assessment: Within Functional Limits LLE Assessment LLE Assessment: Exceptions to [facility] LLE Strength LLE Overall  Strength: Due to pain;Deficits LLE Overall Strength Comments: Pt overall strength 4+/5 except the following. Left Hip Flexion: 2/5 Left Knee Extension: 3-/5 Mobility (including Balance) Bed Mobility Bed Mobility: Yes Rolling Left: 4: Min assist  Rolling Left Details (indicate cue type and reason): Assistance to complete roll with cues for hand placement Left Sidelying to Sit: 4: Min assist;With rails;HOB flat Left Sidelying to Sit Details (indicate cue type and reason): Assistance to initiate transfer with cues for hand placement Transfers Transfers: Yes Sit to Stand: 1: +2 Total assist;Patient percentage (comment);From bed;From elevated surface (Pt 60%) Sit to Stand Details (indicate cue type and reason): Assistance to initiate transfer and to maintain balance during transfer.  Max cues for hand and LE placement. Stand to Sit: 1: +2 Total assist;Patient percentage (comment);To chair/3-in-1 (pt 60%) Stand to Sit Details: Assistance to maintain balance with max cues for hand placement. Ambulation/Gait Ambulation/Gait: Yes Ambulation/Gait Assistance: 1: +2 Total assist;Patient percentage (comment) (pt 60%) Ambulation/Gait Assistance Details (indicate cue type and reason): Pt able to take 5 side steps toward chair with max cues for weight shifts and manual cues to prevent left LE knee buckling. Ambulation Distance (Feet): 5 Feet (side steps) Assistive device: Rolling walker Gait Pattern: Decreased stance time - left;Decreased step length - right Stairs: No  Balance Balance Assessed: Yes Static Sitting Balance Static Sitting - Balance Support: Feet supported Static Sitting - Level of Assistance: 7: Independent Static Standing Balance Static Standing - Balance Support: Bilateral upper extremity supported Static Standing - Level of Assistance: 4: Min assist Static Standing - Comment/# of Minutes: Pt needed assitance to maitain midline for ~ 3 minutes of standing.  Pt anxious with noticeable  tremors with standing causing body to sway.  Pt able to maintain balance with weight through Right side to prevent left LE buckling. Exercise    End of Session PT - End of Session Equipment Utilized During Treatment: Gait belt Activity Tolerance: Patient limited by pain Patient left: in chair;with call bell in reach;with family/visitor present Nurse Communication: Mobility status for transfers;Mobility status for ambulation General Behavior During Session: [facility] for tasks performed Cognition: [facility] for tasks performed  [name] [name] 06/19/2011, 10:48 AM [phone]

## 2011-06-19 NOTE — ED Notes (Signed)
Pt resting comfortably in bed at this time.  Family at bedside and admission discussed by Dr Juleen China

## 2011-06-19 NOTE — Consult Note (Signed)
Physical Medicine and Rehabilitation Consult Reason for Consult: Back pain Referring Phsyician: Dr. Jonathon Resides [name] [name] is an 66 y.o. female.   HPI: 66 year old right-handed female with history of diabetes, mellitus seizure disorder since birth admitted January 9 with increasing low back pain x1 week. She denied any fall or trauma. There was no bowel or bladder disturbances. She had recently been seen at the [facility] emergency department received pain medications and was sent home. CT of abdomen and pelvis showed no acute abnormalities. MRI of lumbar spine showed moderate facet hypertrophy, mild disc bulging and a slight anterolisthesis at lumbar L4-5 with mild bi foraminal stenosis. No other significant spinal stenosis or nerve root encroachment noted. Pain management ongoing with the lot added every 4 hours as needed. Physical therapy evaluation January 9 requiring minimal assist to roll to the left and +2 total assist sit to stand(pt 60%). Ambulating 5 feet with a rolling walker and +2 total assist. Physical medicine and rehabilitation was consulted at the request of physical therapy to consider inpatient rehabilitation services  Review of Systems  Constitutional: Negative.   Eyes: Negative.   Respiratory: Negative for cough and shortness of breath.   Cardiovascular: Negative for chest pain.  Gastrointestinal: Positive for constipation. Negative for nausea.  Genitourinary: Negative for dysuria and frequency.  Musculoskeletal: Positive for back pain.  Skin: Negative.   Neurological: Positive for seizures. Negative for dizziness and headaches.  Psychiatric/Behavioral: Negative.    Past Medical History  Diagnosis Date  . Hypertension   . Bronchitis   . Reflux   . Diabetes mellitus   . Seizures    Past Surgical History  Procedure Date  . Abdominal hysterectomy    Family History  Problem Relation Age of Onset  . Diabetes type II Sister   . Coronary artery disease  Sister    Social History:  reports that she has never smoked. She does not have any smokeless tobacco history on file. She reports that she does not drink alcohol or use illicit drugs. Allergies: No Known Allergies Medications Prior to Admission  Medication Dose Route Frequency Provider Last Rate Last Dose  . acetaminophen (TYLENOL) tablet 650 mg  650 mg Oral Q6H PRN Eduard Clos       Or  . acetaminophen (TYLENOL) suppository 650 mg  650 mg Rectal Q6H PRN Eduard Clos      . cholecalciferol (VITAMIN D) tablet 2,000 Units  2,000 Units Oral Daily Eduard Clos   2,000 Units at 06/19/11 1019  . famotidine (PEPCID) tablet 20 mg  20 mg Oral Daily Arshad N. Kakrakandy   20 mg at 06/19/11 1019  . gabapentin (NEURONTIN) capsule 100 mg  100 mg Oral TID Meryle Ready. Kakrakandy   100 mg at 06/19/11 1019  . hydrochlorothiazide (HYDRODIURIL) tablet 25 mg  25 mg Oral Daily Arshad N. Kakrakandy   25 mg at 06/19/11 1019  . HYDROmorphone (DILAUDID) injection 0.5 mg  0.5 mg Intravenous Q4H PRN Eduard Clos      . insulin aspart (novoLOG) injection 0-15 Units  0-15 Units Subcutaneous TID WC Eduard Clos   2 Units at 06/19/11 1326  . insulin glargine (LANTUS) injection 40 Units  40 Units Subcutaneous QHS Eduard Clos      . iohexol (OMNIPAQUE) 300 MG/ML solution 100 mL  100 mL Intravenous Once PRN Medication Radiologist   100 mL at 06/18/11 2203  . levETIRAcetam (KEPPRA) tablet 500 mg  500 mg Oral BID Barkley Bruns  Hollie Salk, PHARMD   500 mg at 06/19/11 1019  . lisinopril (PRINIVIL,ZESTRIL) tablet 20 mg  20 mg Oral Daily Arshad N. Kakrakandy   20 mg at 06/19/11 1019  . morphine 4 MG/ML injection 4 mg  4 mg Intravenous Once Forbes Cellar, MD   4 mg at 06/18/11 2101  . morphine 4 MG/ML injection 4 mg  4 mg Intravenous Once Forbes Cellar, MD   4 mg at 06/19/11 0040  . ondansetron (ZOFRAN) tablet 4 mg  4 mg Oral Q6H PRN Eduard Clos       Or  . ondansetron (ZOFRAN)  injection 4 mg  4 mg Intravenous Q6H PRN Eduard Clos      . oxyCODONE (Oxy IR/ROXICODONE) immediate release tablet 5 mg  5 mg Oral Q4H PRN Eduard Clos   5 mg at 06/19/11 0951  . oxyCODONE-acetaminophen (PERCOCET) 5-325 MG per tablet 1 tablet  1 tablet Oral Once Forbes Cellar, MD   1 tablet at 06/19/11 0015  . potassium chloride (K-DUR,KLOR-CON) CR tablet 10 mEq  10 mEq Oral Daily Arshad N. Kakrakandy   10 mEq at 06/19/11 1019  . sodium chloride 0.9 % bolus 1,000 mL  1,000 mL Intravenous Once Forbes Cellar, MD   1,000 mL at 06/18/11 2100  . sodium chloride 0.9 % injection 3 mL  3 mL Intravenous Q12H Eduard Clos      . topiramate (TOPAMAX) tablet 100 mg  100 mg Oral BID Eduard Clos      . topiramate (TOPAMAX) tablet 200 mg  200 mg Oral BID Arshad N. Kakrakandy   200 mg at 06/19/11 1020  . DISCONTD: levETIRAcetam (KEPPRA XR) 24 hr tablet 500 mg  500 mg Oral BID Eduard Clos       Medications Prior to Admission  Medication Sig Dispense Refill  . cyclobenzaprine (FLEXERIL) 10 MG tablet Take 0.5 tablets (5 mg total) by mouth at bedtime as needed for muscle spasms.  10 tablet  0  . oxyCODONE-acetaminophen (PERCOCET) 5-325 MG per tablet Take 1 tablet by mouth every 4 (four) hours as needed for pain.  20 tablet  0    Home: Home Living Lives With: Family Receives Help From: Family Type of Home: House Home Layout: One level Home Access: Stairs to enter Entrance Stairs-Rails: Right Entrance Stairs-Number of Steps: 2 Bathroom Shower/Tub: Engineer, manufacturing systems: Standard Bathroom Accessibility: Yes How Accessible: Accessible via walker Home Adaptive Equipment: None  Functional History: Prior Function Level of Independence: Independent with basic ADLs;Independent with gait;Independent with transfers Able to Take Stairs?: Yes Driving: Yes Functional Status:  Mobility: Bed Mobility Bed Mobility: Yes Rolling Left: 4: Min assist Rolling Left  Details (indicate cue type and reason): Assistance to complete roll with cues for hand placement Left Sidelying to Sit: 4: Min assist;With rails;HOB flat Left Sidelying to Sit Details (indicate cue type and reason): Assistance to initiate transfer with cues for hand placement Transfers Transfers: Yes Sit to Stand: 1: +2 Total assist;Patient percentage (comment);From bed;From elevated surface (Pt 60%) Sit to Stand Details (indicate cue type and reason): Assistance to initiate transfer and to maintain balance during transfer.  Max cues for hand and LE placement. Stand to Sit: 1: +2 Total assist;Patient percentage (comment);To chair/3-in-1 (pt 60%) Stand to Sit Details: Assistance to maintain balance with max cues for hand placement. Ambulation/Gait Ambulation/Gait: Yes Ambulation/Gait Assistance: 1: +2 Total assist;Patient percentage (comment) (pt 60%) Ambulation/Gait Assistance Details (indicate cue type and reason): Pt able to  take 5 side steps toward chair with max cues for weight shifts and manual cues to prevent left LE knee buckling. Ambulation Distance (Feet): 5 Feet (side steps) Assistive device: Rolling walker Gait Pattern: Decreased stance time - left;Decreased step length - right Stairs: No    ADL:    Cognition: Cognition Arousal/Alertness: Awake/alert Cognition Arousal/Alertness: Awake/alert Overall Cognitive Status: Appears within functional limits for tasks assessed  Blood pressure 138/71, pulse 73, temperature 97.7 F (36.5 C), temperature source Oral, resp. rate 18, height 5\' 1"  (1.549 m), weight 108.863 kg (240 lb), SpO2 100.00%. Physical Exam  Constitutional: She is oriented to person, place, and time. She appears well-developed.  Neck: Normal range of motion. Neck supple. No thyromegaly present.  Cardiovascular: Normal rate.   Pulmonary/Chest: Effort normal. She has no wheezes.  Abdominal: She exhibits no distension. There is no tenderness.  Musculoskeletal: She  exhibits no edema.       Left leg actually is painful to touch. Equivocal straight leg raise on left. Low back is tender to palpation in the mid to lower lumbar spine left more than right. It's difficult to appreciate any muscle spasm given positioned her in examination and her obesity.  Neurological: She is alert and oriented to person, place, and time.       Patient with intact upper extremity strength although some giveaway weakness was noted in the left upper extremity and nonphysiological tremor was seen. Left lower extremity with some pain in addition weakness especially proximally. Left lower extremity grossly 2+ to 3/5 proximal to 4+ out of 5 distally. Right lower extremity grossly 3+ to 4+5 proximal to distal. Reflexes are 1+ to 2+ throughout. Subjectively she had diminished pinprick and light touch over the left leg.  Skin: Skin is warm and dry.  Psychiatric: She has a normal mood and affect.       Patient generally a bit anxious but otherwise appropriate from a conversational standpoint. Should fair insight and awareness.    Results for orders placed during the hospital encounter of 06/18/11 (from the past 24 hour(s))  URINALYSIS, ROUTINE W REFLEX MICROSCOPIC     Status: Abnormal   Collection Time   06/18/11  5:50 PM      Component Value Range   Color, Urine YELLOW  YELLOW    APPearance CLEAR  CLEAR    Specific Gravity, Urine 1.011  1.005 - 1.030    pH 5.0  5.0 - 8.0    Glucose, UA NEGATIVE  NEGATIVE (mg/dL)   Hgb urine dipstick NEGATIVE  NEGATIVE    Bilirubin Urine NEGATIVE  NEGATIVE    Ketones, ur NEGATIVE  NEGATIVE (mg/dL)   Protein, ur NEGATIVE  NEGATIVE (mg/dL)   Urobilinogen, UA 0.2  0.0 - 1.0 (mg/dL)   Nitrite NEGATIVE  NEGATIVE    Leukocytes, UA TRACE (*) NEGATIVE   URINE MICROSCOPIC-ADD ON     Status: Normal   Collection Time   06/18/11  5:50 PM      Component Value Range   Squamous Epithelial / LPF RARE  RARE    WBC, UA 0-2  <3 (WBC/hpf)   Bacteria, UA RARE  RARE     CBC     Status: Abnormal   Collection Time   06/18/11  9:07 PM      Component Value Range   WBC 11.3 (*) 4.0 - 10.5 (K/uL)   RBC 4.43  3.87 - 5.11 (MIL/uL)   Hemoglobin 12.6  12.0 - 15.0 (g/dL)   HCT 38.0  36.0 - 46.0 (%)   MCV 85.8  78.0 - 100.0 (fL)   MCH 28.4  26.0 - 34.0 (pg)   MCHC 33.2  30.0 - 36.0 (g/dL)   RDW 14.7  82.9 - 56.2 (%)   Platelets 270  150 - 400 (K/uL)  DIFFERENTIAL     Status: Abnormal   Collection Time   06/18/11  9:07 PM      Component Value Range   Neutrophils Relative 70  43 - 77 (%)   Neutro Abs 7.9 (*) 1.7 - 7.7 (K/uL)   Lymphocytes Relative 22  12 - 46 (%)   Lymphs Abs 2.4  0.7 - 4.0 (K/uL)   Monocytes Relative 6  3 - 12 (%)   Monocytes Absolute 0.7  0.1 - 1.0 (K/uL)   Eosinophils Relative 2  0 - 5 (%)   Eosinophils Absolute 0.2  0.0 - 0.7 (K/uL)   Basophils Relative 0  0 - 1 (%)   Basophils Absolute 0.0  0.0 - 0.1 (K/uL)  COMPREHENSIVE METABOLIC PANEL     Status: Abnormal   Collection Time   06/18/11  9:07 PM      Component Value Range   Sodium 137  135 - 145 (mEq/L)   Potassium 4.0  3.5 - 5.1 (mEq/L)   Chloride 102  96 - 112 (mEq/L)   CO2 26  19 - 32 (mEq/L)   Glucose, Bld 243 (*) 70 - 99 (mg/dL)   BUN 21  6 - 23 (mg/dL)   Creatinine, Ser 1.30  0.50 - 1.10 (mg/dL)   Calcium 9.7  8.4 - 86.5 (mg/dL)   Total Protein 6.8  6.0 - 8.3 (g/dL)   Albumin 3.3 (*) 3.5 - 5.2 (g/dL)   AST 15  0 - 37 (U/L)   ALT 11  0 - 35 (U/L)   Alkaline Phosphatase 75  39 - 117 (U/L)   Total Bilirubin 0.1 (*) 0.3 - 1.2 (mg/dL)   GFR calc non Af Amer 67 (*) >90 (mL/min)   GFR calc Af Amer 77 (*) >90 (mL/min)  LIPASE, BLOOD     Status: Normal   Collection Time   06/18/11  9:07 PM      Component Value Range   Lipase 23  11 - 59 (U/L)  GLUCOSE, CAPILLARY     Status: Abnormal   Collection Time   06/19/11  6:07 AM      Component Value Range   Glucose-Capillary 178 (*) 70 - 99 (mg/dL)  COMPREHENSIVE METABOLIC PANEL     Status: Abnormal   Collection Time   06/19/11  7:05 AM       Component Value Range   Sodium 140  135 - 145 (mEq/L)   Potassium 3.8  3.5 - 5.1 (mEq/L)   Chloride 106  96 - 112 (mEq/L)   CO2 25  19 - 32 (mEq/L)   Glucose, Bld 188 (*) 70 - 99 (mg/dL)   BUN 19  6 - 23 (mg/dL)   Creatinine, Ser 7.84  0.50 - 1.10 (mg/dL)   Calcium 9.3  8.4 - 69.6 (mg/dL)   Total Protein 6.4  6.0 - 8.3 (g/dL)   Albumin 2.9 (*) 3.5 - 5.2 (g/dL)   AST 13  0 - 37 (U/L)   ALT 10  0 - 35 (U/L)   Alkaline Phosphatase 65  39 - 117 (U/L)   Total Bilirubin 0.2 (*) 0.3 - 1.2 (mg/dL)   GFR calc non Af Amer 71 (*) >90 (mL/min)  GFR calc Af Amer 83 (*) >90 (mL/min)  CBC     Status: Normal   Collection Time   06/19/11  7:05 AM      Component Value Range   WBC 9.2  4.0 - 10.5 (K/uL)   RBC 4.20  3.87 - 5.11 (MIL/uL)   Hemoglobin 12.0  12.0 - 15.0 (g/dL)   HCT 16.1  09.6 - 04.5 (%)   MCV 87.9  78.0 - 100.0 (fL)   MCH 28.6  26.0 - 34.0 (pg)   MCHC 32.5  30.0 - 36.0 (g/dL)   RDW 40.9  81.1 - 91.4 (%)   Platelets 256  150 - 400 (K/uL)  TSH     Status: Normal   Collection Time   06/19/11  7:05 AM      Component Value Range   TSH 1.119  0.350 - 4.500 (uIU/mL)  GLUCOSE, CAPILLARY     Status: Abnormal   Collection Time   06/19/11  1:07 PM      Component Value Range   Glucose-Capillary 141 (*) 70 - 99 (mg/dL)   Mr Lumbar Spine Wo Contrast  06/19/2011  *RADIOLOGY REPORT*  Clinical Data: Chronic low back pain with bilateral leg pain and weakness.  No known injury or prior relevant surgery.  MRI LUMBAR SPINE WITHOUT CONTRAST  Technique:  Multiplanar and multiecho pulse sequences of the lumbar spine were obtained without intravenous contrast.  Comparison: Abdominal pelvic CT 06/18/2011.  Findings: There is transitional lumbosacral anatomy with a partially sacralized L5 segment.  There is a left-sided lumbosacral assimilation joint.  Exaggerated lumbar lordosis is again noted without associated listhesis.  There is no evidence of acute fracture or pars defect.  Hemangiomas are noted at  T11, L3 and L5.  The conus medullaris extends to the L2 level and appears normal. There are no paraspinal abnormalities.  There are no significant disc space findings from T10-T11 through L1-L2.  Small anterior osteophytes are present at T9-T10 and T10- T11.  L2-L3:  Minimal disc desiccation and bilateral facet hypertrophy. No spinal stenosis or nerve root encroachment.  L3-L4:  Minimal disc desiccation and mild bilateral facet hypertrophy.  No spinal stenosis or nerve root encroachment.  L4-L5: There is mild disc bulging and a slight anterolisthesis. There is moderate bilateral facet hypertrophy contributing to mild left greater than right superior foraminal narrowing.  Left L4 nerve root encroachment is possible.  Both lateral recesses are mildly narrowed.  There is no focal disc protrusion.  L5-S1:  There is mild disc bulging with mild bilateral facet hypertrophy.  Left-sided lumbosacral assimilation joint is noted. There is no significant spinal stenosis or nerve root encroachment.  IMPRESSION:  1.  Transitional lumbosacral anatomy with a transitional L5 segment and left-sided lumbosacral assimilation joint. 2.  Moderate facet hypertrophy, mild disc bulging and a slight anterolisthesis at L4-L5 contribute to mild biforaminal stenosis and possible left L4 nerve root encroachment. 3.  No other significant spinal stenosis or nerve root encroachment. 4.  No acute osseous findings.  Original Report Authenticated By: Gerrianne Scale, M.D.   Ct Abdomen Pelvis W Contrast  06/18/2011  *RADIOLOGY REPORT*  Clinical Data: Left lower quadrant pain.  Left-sided back pain.  CT ABDOMEN AND PELVIS WITH CONTRAST  Technique:  Multidetector CT imaging of the abdomen and pelvis was performed following the standard protocol during bolus administration of intravenous contrast.  Contrast: OMNIPAQUE IOHEXOL 300 MG/ML IV SOLN  Comparison: None.  Findings: Dependent atelectasis at the lung bases.  Patulous  gastroesophageal  junction.  Contrast outlines food within the stomach.  The stomach is distended.  Right interpolar simple renal cyst measures 14 mm.  Normal renal enhancement and excretion of contrast.  Both adrenal glands appear within normal limits.  Pancreas is unremarkable aside from mild fatty atrophy of the uncinate.  No calcified gallstones.  Gallbladder appears within normal limits by CT.  Spleen normal.  Duodenum within normal limits.  Small bowel appears normal without evidence of obstruction.  Hysterectomy. Urinary bladder is normal.  Mild colonic diverticulosis.  There is no colonic diverticulitis.  Normal appendix.  No aggressive osseous lesions.  Exaggerated lumbar lordosis.  Lower lumbar facet arthrosis.  Degenerative disc disease worst at L5-S1. Small radiolucent lesion in the right side of L5 probably represents hemangioma.  The bilateral SI joint degenerative disease.  IMPRESSION:  1.  No acute abnormality. 2.  Right renal cyst. 3.  Hysterectomy. 4.  Lumbar spondylosis and facet arthrosis.  Original Report Authenticated By: Andreas Newport, M.D.    Assessment/Plan: Diagnosis: Lumbar spondylosis with notable facet disease on MRI. She may have a L4 radiculopathy contributing to left leg symptoms. I suspect there is a myofascial/muscle strain component to her back pain is well 1. Does the need for close, 24 hr/day medical supervision in concert with the patient's rehab needs make it unreasonable for this patient to be served in a less intensive setting? No 2. Co-Morbidities requiring supervision/potential complications: Diabetes and seizure disorder 3. Due to pain management, does the patient require 24 hr/day rehab nursing? No 4. Does the patient require coordinated care of a physician, rehab nurse, PT and OT to address physical and functional deficits in the context of the above medical diagnosis(es)? No Addressing deficits in the following areas: balance, bowel/bladder control, strength and  transferring 5. Can the patient actively participate in an intensive therapy program of at least 3 hrs of therapy per day at least 5 days per week? Potentially 6. The potential for patient to make measurable gains while on inpatient rehab is fair 7. Anticipated functional outcomes upon discharge from inpatients are to be determined/not applicable 8. Estimated rehab length of stay to reach the above functional goals is: To be determined/not applicable 9. Does the patient have adequate social supports to accommodate these discharge functional goals? Not applicable 10. Anticipated D/C setting: Home 11. Anticipated post D/C treatments: HH therapy 12. Overall Rehab/Functional Prognosis: good  RECOMMENDATIONS: This patient's condition is appropriate for continued rehabilitative care in the following setting: [facility] PT Patient has agreed to participate in recommended program. Potentially Note that insurance prior authorization may be required for reimbursement for recommended care.  Comment: Patient needs pain management and mobilization. It might be worthwhile to attempt a left transforaminal epidural steroid injection at L4-L5 to address the left L4 nerve root. Her axial back pain is likely related to her facet disease however there appears to be a myofascial component as well. At this point, having been  in the hospital only a day, it is quite premature to consider an inpatient rehabilitation admission. Furthermore, we would have a hard time justifying an inpatient rehabilitation stay based on medical necessity. Rehabilitation nurse to followup.  ZTS 06/20/11 0745

## 2011-06-19 NOTE — ED Notes (Signed)
Pt OOB to [facility] with assist x2.  Pt unsteady on feet but able to care for self.

## 2011-06-19 NOTE — ED Notes (Signed)
Pt eating soup, crackers and gingerale without difficulty. pts daughter Nat Math left but can be reached at [phone] if needed before she returns.

## 2011-06-20 ENCOUNTER — Inpatient Hospital Stay (HOSPITAL_COMMUNITY): Payer: Self-pay

## 2011-06-20 DIAGNOSIS — IMO0002 Reserved for concepts with insufficient information to code with codable children: Secondary | ICD-10-CM

## 2011-06-20 DIAGNOSIS — M47817 Spondylosis without myelopathy or radiculopathy, lumbosacral region: Secondary | ICD-10-CM

## 2011-06-20 DIAGNOSIS — Q762 Congenital spondylolisthesis: Secondary | ICD-10-CM

## 2011-06-20 LAB — BASIC METABOLIC PANEL
Chloride: 103 mEq/L (ref 96–112)
GFR calc Af Amer: 65 mL/min — ABNORMAL LOW (ref >90)
GFR calc non Af Amer: 56 mL/min — ABNORMAL LOW (ref >90)
Glucose, Bld: 183 mg/dL — ABNORMAL HIGH (ref 70–99)
Potassium: 3.9 mEq/L (ref 3.5–5.1)
Sodium: 136 mEq/L (ref 135–145)

## 2011-06-20 LAB — CBC
HCT: 36.5 % (ref 36.0–46.0)
Hemoglobin: 11.9 g/dL — ABNORMAL LOW (ref 12.0–15.0)
MCH: 28.7 pg (ref 26.0–34.0)
RBC: 4.15 MIL/uL (ref 3.87–5.11)

## 2011-06-20 LAB — GLUCOSE, CAPILLARY
Glucose-Capillary: 175 mg/dL — ABNORMAL HIGH (ref 70–99)
Glucose-Capillary: 165 mg/dL — ABNORMAL HIGH (ref 70–99)
Glucose-Capillary: 198 mg/dL — ABNORMAL HIGH (ref 70–99)

## 2011-06-20 MED ORDER — IOHEXOL 180 MG/ML  SOLN: 5.0000 mL | Freq: Once | INTRAMUSCULAR | Status: AC | PRN

## 2011-06-20 NOTE — Procedures (Signed)
Left L4/5 transforaminal epidural and left L4 nerve root block.  120 mg depot-medrol. 1ml 1% lidocaine.  No complications.

## 2011-06-20 NOTE — Progress Notes (Addendum)
Interval history  66 year old African American female patient with history of type 2 diabetes mellitus, seizure disorder, hypertension and obesity was admitted on 06/19/2011 with worsening low back pain left greater than the right of approximately 10 days duration. She denies weakness or numbness or tingling in the legs. No sphincter disturbances. Back pain continues to be severe despite pain medications. MRI shows mild disc bulging and spinal stenosis at L4-5. Neurosurgery surgery consulted for evaluation and assistance.    Subjective: Patient has low back pain is unchanged and severe and rates it at 9/10. It is worse with sitting.  Objective: Blood pressure 136/70, pulse 78, temperature 98 F (36.7 C), temperature source Oral, resp. rate 20, height 5\' 1"  (1.549 m), weight 108.863 kg (240 lb), SpO2 100.00%.  Intake/Output Summary (Last 24 hours) at 06/20/11 1248 Last data filed at 06/20/11 0654  Gross per 24 hour  Intake    600 ml  Output    700 ml  Net   -100 ml    General Exam:  patient seen sitting on a chair in mild painful distress. Overweight Respiratory System: Clear. No increased work of breathing. Cardiovascular System: First and second heart sounds heard. Regular rate and rhythm. No JVD/murmurs. Gastrointestinal System: Abdomen is non distended, soft and normal bowel sounds heard. Nontender Central Nervous System: Alert and oriented. No focal neurological deficits. Extremities: Grade 5 x 5 power symmetrically. However limitation in assessment of power in the left lower extremity which is limited by pain.  Lab Results: Basic Metabolic Panel:  Basename 06/20/11 0450 06/19/11 0705  NA 136 140  K 3.9 3.8  CL 103 106  CO2 27 25  GLUCOSE 183* 188*  BUN 21 19  CREATININE 1.04 0.85  CALCIUM 9.7 9.3  MG -- --  PHOS -- --   Liver Function Tests:  [facility] 06/19/11 0705 06/18/11 2107  AST 13 15  ALT 10 11  ALKPHOS 65 75  BILITOT 0.2* 0.1*  PROT 6.4 6.8  ALBUMIN  2.9* 3.3*    Basename 06/18/11 2107  LIPASE 23  AMYLASE --   No results found for this basename: AMMONIA:2 in the last 72 hours CBC:  Basename 06/20/11 0450 06/19/11 0705 06/18/11 2107  WBC 8.9 9.2 --  NEUTROABS -- -- 7.9*  HGB 11.9* 12.0 --  HCT 36.5 36.9 --  MCV 88.0 87.9 --  PLT 242 256 --   CBG:  Basename 06/20/11 1106 06/20/11 0637 06/19/11 2158 06/19/11 1615 06/19/11 1307 06/19/11 0607  GLUCAP 97 175* 149* 105* 141* 178*   Hemoglobin A1C:  Basename 06/19/11 0705  HGBA1C 9.7*   Fasting Lipid Panel: No results found for this basename: CHOL,HDL,LDLCALC,TRIG,CHOLHDL,LDLDIRECT in the last 72 hours Thyroid Function Tests:  Basename 06/19/11 0705  TSH 1.119  T4TOTAL --  FREET4 --  T3FREE --  THYROIDAB --    Studies/Results: Mr Lumbar Spine Wo Contrast  06/19/2011  *RADIOLOGY REPORT*  Clinical Data: Chronic low back pain with bilateral leg pain and weakness.  No known injury or prior relevant surgery.  MRI LUMBAR SPINE WITHOUT CONTRAST   IMPRESSION:  1.  Transitional lumbosacral anatomy with a transitional L5 segment and left-sided lumbosacral assimilation joint. 2.  Moderate facet hypertrophy, mild disc bulging and a slight anterolisthesis at L4-L5 contribute to mild biforaminal stenosis and possible left L4 nerve root encroachment. 3.  No other significant spinal stenosis or nerve root encroachment. 4.  No acute osseous findings.  Original Report Authenticated By: Gerrianne Scale, M.D.   Ct Abdomen  Pelvis W Contrast  06/18/2011  *RADIOLOGY REPORT*  Clinical Data: Left lower quadrant pain.  Left-sided back pain.  CT ABDOMEN AND PELVIS WITH CONTRAST   IMPRESSION:  1.  No acute abnormality. 2.  Right renal cyst. 3.  Hysterectomy. 4.  Lumbar spondylosis and facet arthrosis.  Original Report Authenticated By: Andreas Newport, M.D.    Medications: Scheduled Meds:    . cholecalciferol  2,000 Units Oral Daily  . famotidine  20 mg Oral Daily  . gabapentin  100 mg Oral TID    . hydrochlorothiazide  25 mg Oral Daily  . insulin aspart  0-15 Units Subcutaneous TID WC  . insulin glargine  40 Units Subcutaneous QHS  . levETIRAcetam  500 mg Oral BID  . lisinopril  20 mg Oral Daily  . potassium chloride  10 mEq Oral Daily  . sodium chloride  3 mL Intravenous Q12H  . topiramate  100 mg Oral BID  . topiramate  200 mg Oral BID   Continuous Infusions:  PRN Meds:.acetaminophen, acetaminophen, HYDROmorphone, ondansetron (ZOFRAN) IV, ondansetron, oxyCODONE  Assessment/Plan: 1. Acute on ?chronic low back pain:? Secondary to degenerative joint disease, mild disc bulging and spinal stenosis.  patient complains of continued severe pain. Neurosurgery/spine consulted to assist with evaluation and management. 2. Uncontrolled type 2 diabetes mellitus, as evidenced by hemoglobin A1c of 9.7. Inpatient control is fair. 3. Hypertension: Controlled. Continue current medications 4. History of seizure disorder: Continue Keppra  Addendum:  Dr. Phoebe Perch, neurosurgery reviewed patient's MRI images and recommended that there is no surgical intervention necessary and patient may benefit from pain management consult.  Review Dr. Faith Rogue, rehabilitation MDs recommendation. He indicates that it might be worthwhile to attempt a left transforaminal epidural steroid injection at L4-5 to address the left L4 nerve root. Will request interventional radiology to do this.    [name] [name] 06/20/2011, 12:48 PM

## 2011-06-20 NOTE — Progress Notes (Signed)
Noted plans for possible epidural steroid treatment. Do not feel we can justify medically to admit patient to acute inpatient rehabilitation. Recommend follow up therapy with [facility] or SNF as needed. Please call with any questions. Pager [phone].

## 2011-06-21 LAB — GLUCOSE, CAPILLARY: Glucose-Capillary: 206 mg/dL — ABNORMAL HIGH (ref 70–99)

## 2011-06-21 MED ORDER — OXYCODONE HCL 5 MG PO TABS: 5.0000 mg | ORAL_TABLET | Freq: Four times a day (QID) | ORAL | Status: AC | PRN

## 2011-06-21 MED FILL — Methylprednisolone Acetate PF Inj Susp 40 MG/ML: INTRAMUSCULAR | Qty: 1 | Status: AC

## 2011-06-21 MED FILL — Methylprednisolone Acetate PF Inj Susp 80 MG/ML: INTRAMUSCULAR | Qty: 1 | Status: AC

## 2011-06-21 NOTE — Progress Notes (Signed)
   CARE MANAGEMENT NOTE 06/21/2011  Patient:  [name] [name],[name] [name]   Account Number:  [id number]  Date Initiated:  06/19/2011  Documentation initiated by:  Onnie Boer  Subjective/Objective Assessment:   PT WAS ADMITTED WITH LOWER BACK PAIN     Action/Plan:   PROGRESSION OF CARE AND DISCHARGE PLANNING   Anticipated DC Date:  06/20/2011   Anticipated DC Plan:  HOME W HOME HEALTH SERVICES      DC Planning Services  CM consult      Choice offered to / List presented to:     DME arranged  3-N-1  [facility] Hurst      DME agency  [facility]     HH arranged  HH-2 PT      [facility] agency  [facility]   Status of service:  In process, will continue to follow Medicare Important Message given?   (If response is "NO", the following Medicare IM given date fields will be blank) Date Medicare IM given:   Date Additional Medicare IM given:    Discharge Disposition:    Per UR Regulation:  Reviewed for med. necessity/level of care/duration of stay  Comments:  06/21/11 Onnie Boer, RN, BSN 1543 PT WAS ARRANGED WITH [facility] FOR [facility] PT AND 3N1 AND RW.  I ALSO TRIED TO CONTACT FINANCIAL COUNCIL ABOUT CHARITY OR ASSISTANCE WITH CURRENT BILLING, MESSAGE LEFT.  06/19/2011 Onnie Boer, RN, BSN [phone] PT WAS ADMITTED WITH BACK PAIN.  PT TO HAVE MRI TODAY, PT/OT EVAL SUGGESTED CIR, THEY WERE CONSULTED AND WISH TO HOLD OFF UNTIL RESULTS FROM MRI ARE GIVEN.  PT HAD QUESTIONS ABOUT MED ASSISTANCE.  [facility] [place] AND HAS AN ORANGE CARD.

## 2011-06-21 NOTE — Progress Notes (Signed)
Physical Therapy Treatment Patient Details Name: [name] [name] MRN: [id number] DOB: 13-Apr-1946 Today's Date: 06/21/2011  PT Assessment/Plan  PT - Assessment/Plan Comments on Treatment Session: Pt is able to ambulate this session but continues to be limited due to pain.  Pt with noticeable tremors with movement.  Inpatient rehab did not accept pt therefore will need HHPT at home with 24 hour assistance.  Pt continues to have significant LLE knee buckling.  Depending on progress will deteremine on disposition. PT Plan: Discharge plan remains appropriate;Frequency needs to be updated PT Frequency: Min 5X/week Follow Up Recommendations: Home health PT (HHPT with 24 hour assist VS SNF) Equipment Recommended: Rolling walker with 5" wheels;3 in 1 bedside comode PT Goals  Acute Rehab PT Goals PT Goal Formulation: With patient/family Time For Goal Achievement: 2 weeks Pt will go Sit to Stand: with supervision PT Goal: Sit to Stand - Progress: Progressing toward goal Pt will go Stand to Sit: with supervision PT Goal: Stand to Sit - Progress: Progressing toward goal Pt will Transfer Bed to Chair/Chair to Bed: with min assist PT Transfer Goal: Bed to Chair/Chair to Bed - Progress: Progressing toward goal Pt will Stand: with supervision;3 - 5 min;with bilateral upper extremity support PT Goal: Stand - Progress: Progressing toward goal Pt will Ambulate: 51 - 150 feet;with min assist;with rolling walker PT Goal: Ambulate - Progress: Progressing toward goal  PT Treatment Precautions/Restrictions  Precautions Precautions: Fall;Back Precaution Booklet Issued: Yes (comment) Restrictions Weight Bearing Restrictions: No Mobility (including Balance) Transfers Transfers: Yes Sit to Stand: 4: Min assist;With armrests;From chair/3-in-1 Sit to Stand Details (indicate cue type and reason): Assistance to initiate transfer with cues for hand and LE placement.  Pt continues to tremor in UE and LE body during  transfer.  Assistance to maintain balance. Stand to Sit: 4: Min assist;With armrests;To chair/3-in-1 Stand to Sit Details: Assistance to slowly descend to recliner with max cues for hand placement. Ambulation/Gait Ambulation/Gait: Yes Ambulation/Gait Assistance: 4: Min assist Ambulation/Gait Assistance Details (indicate cue type and reason): Assistance to maintain balance and manual cues to prevent LLE buckling. Ambulation Distance (Feet): 15 Feet Assistive device: Rolling walker Gait Pattern: Decreased stance time - left;Decreased step length - right  Balance Balance Assessed: Yes Static Standing Balance Static Standing - Balance Support: Bilateral upper extremity supported Static Standing - Level of Assistance: 4: Min assist Static Standing - Comment/# of Minutes: Pt needs intermittent assistance to maintain balance during standing. Exercise    End of Session PT - End of Session Equipment Utilized During Treatment: Gait belt Activity Tolerance: Patient limited by pain Patient left: in chair;with call bell in reach;with family/visitor present Nurse Communication: Mobility status for transfers;Mobility status for ambulation General Behavior During Session: [facility] for tasks performed Cognition: [facility] for tasks performed  Devarion Mcclanahan 06/21/2011, 12:42 PM [phone]

## 2011-06-21 NOTE — Progress Notes (Signed)
Interval history  66 year old African American female patient with history of type 2 diabetes mellitus, seizure disorder, hypertension and obesity was admitted on 06/19/2011 with worsening low back pain left greater than the right of approximately 10 days duration. She denies weakness or numbness or tingling in the legs. No sphincter disturbances. Back pain continues to be severe despite pain medications. MRI shows mild disc bulging and spinal stenosis at L4-5. Patient is status post left L4/5 transforaminal epidural and left L4 nerve root block on 06/20/2011.   Subjective: Patient says back pain has significantly improved and is now 5/10 in severity.  Objective: Blood pressure 116/62, pulse 62, temperature 97.7 F (36.5 C), temperature source Oral, resp. rate 20, height 5\' 1"  (1.549 m), weight 108.863 kg (240 lb), SpO2 100.00%.  Intake/Output Summary (Last 24 hours) at 06/21/11 1616 Last data filed at 06/21/11 1130  Gross per 24 hour  Intake    720 ml  Output      0 ml  Net    720 ml    General Exam:  Comfortable. Overweight Respiratory System: Clear. No increased work of breathing. Cardiovascular System: First and second heart sounds heard. Regular rate and rhythm. No JVD/murmurs. Gastrointestinal System: Abdomen is non distended, soft and normal bowel sounds heard. Nontender Central Nervous System: Alert and oriented. No focal neurological deficits. Extremities: Grade 5 x 5 power symmetrically.   Lab Results: Basic Metabolic Panel:  Basename 06/20/11 0450 06/19/11 0705  NA 136 140  K 3.9 3.8  CL 103 106  CO2 27 25  GLUCOSE 183* 188*  BUN 21 19  CREATININE 1.04 0.85  CALCIUM 9.7 9.3  MG -- --  PHOS -- --   Liver Function Tests:  [facility] 06/19/11 0705 06/18/11 2107  AST 13 15  ALT 10 11  ALKPHOS 65 75  BILITOT 0.2* 0.1*  PROT 6.4 6.8  ALBUMIN 2.9* 3.3*    Basename 06/18/11 2107  LIPASE 23  AMYLASE --   No results found for this basename: AMMONIA:2 in the  last 72 hours CBC:  Basename 06/20/11 0450 06/19/11 0705 06/18/11 2107  WBC 8.9 9.2 --  NEUTROABS -- -- 7.9*  HGB 11.9* 12.0 --  HCT 36.5 36.9 --  MCV 88.0 87.9 --  PLT 242 256 --   CBG:  Basename 06/21/11 1136 06/21/11 0620 06/20/11 2136 06/20/11 1615 06/20/11 1106 06/20/11 0637  GLUCAP 228* 206* 198* 165* 97 175*   Hemoglobin A1C:  Basename 06/19/11 0705  HGBA1C 9.7*   Fasting Lipid Panel: No results found for this basename: CHOL,HDL,LDLCALC,TRIG,CHOLHDL,LDLDIRECT in the last 72 hours Thyroid Function Tests:  Basename 06/19/11 0705  TSH 1.119  T4TOTAL --  FREET4 --  T3FREE --  THYROIDAB --    Studies/Results: Mr Lumbar Spine Wo Contrast  06/19/2011  *RADIOLOGY REPORT*  Clinical Data: Chronic low back pain with bilateral leg pain and weakness.  No known injury or prior relevant surgery.  MRI LUMBAR SPINE WITHOUT CONTRAST   IMPRESSION:  1.  Transitional lumbosacral anatomy with a transitional L5 segment and left-sided lumbosacral assimilation joint. 2.  Moderate facet hypertrophy, mild disc bulging and a slight anterolisthesis at L4-L5 contribute to mild biforaminal stenosis and possible left L4 nerve root encroachment. 3.  No other significant spinal stenosis or nerve root encroachment. 4.  No acute osseous findings.  Original Report Authenticated By: Gerrianne Scale, M.D.   Ct Abdomen Pelvis W Contrast  06/18/2011  *RADIOLOGY REPORT*  Clinical Data: Left lower quadrant pain.  Left-sided back pain.  CT ABDOMEN AND PELVIS WITH CONTRAST   IMPRESSION:  1.  No acute abnormality. 2.  Right renal cyst. 3.  Hysterectomy. 4.  Lumbar spondylosis and facet arthrosis.  Original Report Authenticated By: Andreas Newport, M.D.    Medications: Scheduled Meds:    . cholecalciferol  2,000 Units Oral Daily  . famotidine  20 mg Oral Daily  . gabapentin  100 mg Oral TID  . hydrochlorothiazide  25 mg Oral Daily  . insulin aspart  0-15 Units Subcutaneous TID WC  . insulin glargine  40  Units Subcutaneous QHS  . levETIRAcetam  500 mg Oral BID  . lisinopril  20 mg Oral Daily  . potassium chloride  10 mEq Oral Daily  . sodium chloride  3 mL Intravenous Q12H  . topiramate  100 mg Oral BID  . topiramate  200 mg Oral BID   Continuous Infusions:  PRN Meds:.acetaminophen, acetaminophen, HYDROmorphone, iohexol, ondansetron (ZOFRAN) IV, ondansetron, oxyCODONE  Assessment/Plan:  1. Acute on ?chronic low back pain:? Secondary to degenerative joint disease, facet disease and L4 radiculopathy, status postleft L4/5 transforaminal epidural and left L4 nerve root block on 06/20/2011. Pain improved. 2. Uncontrolled type 2 diabetes mellitus, as evidenced by hemoglobin A1c of 9.7. Fluctuating blood sugars a sibling from steroids. Continue current insulins. 3. Hypertension: Controlled. Continue current medications 4. History of seizure disorder: Continue Keppra  On discharge, Physical therapy recommends home health PT with 24-hour assist versus a skilled nursing facility. Patient indicates that although she lives with her spouse and her daughter they are not with her 24/7. She is going to discuss this with them later this evening.  Disposition: If family is able to provide 24-hour assist then possible discharge tomorrow otherwise will need skilled nursing facility placement.   Selene Peltzer 06/21/2011, 4:16 PM

## 2011-06-21 NOTE — Discharge Summary (Signed)
Discharge Summary  Aleece Loyd MR#: [id number]  DOB:01/17/1946  Date of Admission: 06/18/2011 Date of Discharge: 06/21/2011  Patient's PCP: Dr. Margretta Sidle  Attending Physician:Merrill Deanda  Consults: 1.  Interventional radiology 2. Inpatient rehabilitation M.D.  Discharge Diagnoses: 1. Acute on chronic low back pain 2. Type 2 diabetes mellitus 3. Hypertension 4. History of seizure disorder 5. Anemia   Brief Admitting History and Physical 66 year old [place] American female patient with history of type 2 diabetes mellitus, seizure disorder, hypertension and obesity was admitted on 06/19/2011 with worsening low back pain left greater than the right of approximately 10 days duration. She denies weakness or numbness or tingling in the legs. No sphincter disturbances. Back pain continues to be severe despite pain medications.   Discharge Medications Current Discharge Medication List    START taking these medications   Details  oxyCODONE (OXY IR/ROXICODONE) 5 MG immediate release tablet Take 1 tablet (5 mg total) by mouth every 6 (six) hours as needed for pain. Qty: 20 tablet, Refills: 0      CONTINUE these medications which have NOT CHANGED   Details  cholecalciferol (VITAMIN D) 1000 UNITS tablet Take 2,000 Units by mouth daily.      gabapentin (NEURONTIN) 100 MG capsule Take 100 mg by mouth 3 (three) times daily.      hydrochlorothiazide (HYDRODIURIL) 25 MG tablet Take 25 mg by mouth daily.      insulin glargine (LANTUS) 100 UNIT/ML injection Inject 40 Units into the skin at bedtime.      levETIRAcetam (KEPPRA XR) 500 MG 24 hr tablet Take 500 mg by mouth 2 (two) times daily.      lisinopril (PRINIVIL,ZESTRIL) 20 MG tablet Take 20 mg by mouth daily.      naproxen sodium (ANAPROX) 220 MG tablet Take 440 mg by mouth 2 (two) times daily with a meal.      potassium chloride (MICRO-K) 10 MEQ CR capsule Take 10 mEq by mouth daily.      ranitidine (ZANTAC) 150 MG tablet Take 150  mg by mouth 2 (two) times daily.      !! topiramate (TOPAMAX) 100 MG tablet Take 100 mg by mouth 2 (two) times daily.      !! topiramate (TOPAMAX) 200 MG tablet Take 200 mg by mouth 2 (two) times daily.       !! - Potential duplicate medications found. Please discuss with provider.      Hospital Course: 1. Acute on possible chronic low back pain. Patient was admitted to the hospital. MRI of the lumbar spine was done he had which showed facet disease, mild disc bulging and spinal stenosis. Her case was discussed with neurosurgeon on call yesterday who recommended that there was no surgical intervention needed. He recommended pain management consult. Rehabilitation M.D. consulted and recommended transforaminal epidural injection. Interventional radiology performed the same yesterday. Since then patient's pain is significantly improved. She says her pain is down to 5/10 and she can manage this at home. Physical therapy has seen patient and recommend home health services with 24/7 assist. Her family is able to provide this assistance at home. She will be discharged home. She has an appointment to follow up with her primary care physician on Tuesday of next week. She is advised to seek immediate medical attention if there is any worsening of her condition. This M.D. called the [facility] drug pharmacy and there are no purulent prescriptions pending for her. Advised the pharmacy that her Flexeril is being discontinued. 2. Type 2 diabetes mellitus:  Continue home Lantus. Further adjustments can be made to her regimen by her primary care physician. 3. Hypertension: Continue home medications 4. History of seizure disorder: No seizures in the hospital. Continue home medications.   Day of Discharge BP 116/62  Pulse 62  Temp(Src) 97.7 F (36.5 C) (Oral)  Resp 20  Ht 5\' 1"  (1.549 m)  Wt 108.863 kg (240 lb)  BMI 45.35 kg/m2  SpO2 100% General Exam: Comfortable. Overweight  Respiratory System: Clear. No  increased work of breathing.  Cardiovascular System: First and second heart sounds heard. Regular rate and rhythm. No JVD/murmurs.  Gastrointestinal System: Abdomen is non distended, soft and normal bowel sounds heard. Nontender  Central Nervous System: Alert and oriented. No focal neurological deficits.  Extremities: Grade 5 x 5 power symmetrically    Results for orders placed during the hospital encounter of 06/18/11 (from the past 48 hour(s))  GLUCOSE, CAPILLARY     Status: Abnormal   Collection Time   06/19/11  9:58 PM      Component Value Range Comment   Glucose-Capillary 149 (*) 70 - 99 (mg/dL)    Comment 1 Documented in Chart      Comment 2 Notify RN     BASIC METABOLIC PANEL     Status: Abnormal   Collection Time   06/20/11  4:50 AM      Component Value Range Comment   Sodium 136  135 - 145 (mEq/L)    Potassium 3.9  3.5 - 5.1 (mEq/L)    Chloride 103  96 - 112 (mEq/L)    CO2 27  19 - 32 (mEq/L)    Glucose, Bld 183 (*) 70 - 99 (mg/dL)    BUN 21  6 - 23 (mg/dL)    Creatinine, Ser 0.45  0.50 - 1.10 (mg/dL)    Calcium 9.7  8.4 - 10.5 (mg/dL)    GFR calc non Af Amer 56 (*) >90 (mL/min)    GFR calc Af Amer 65 (*) >90 (mL/min)   CBC     Status: Abnormal   Collection Time   06/20/11  4:50 AM      Component Value Range Comment   WBC 8.9  4.0 - 10.5 (K/uL)    RBC 4.15  3.87 - 5.11 (MIL/uL)    Hemoglobin 11.9 (*) 12.0 - 15.0 (g/dL)    HCT 40.9  81.1 - 91.4 (%)    MCV 88.0  78.0 - 100.0 (fL)    MCH 28.7  26.0 - 34.0 (pg)    MCHC 32.6  30.0 - 36.0 (g/dL)    RDW 78.2  95.6 - 21.3 (%)    Platelets 242  150 - 400 (K/uL)   GLUCOSE, CAPILLARY     Status: Abnormal   Collection Time   06/20/11  6:37 AM      Component Value Range Comment   Glucose-Capillary 175 (*) 70 - 99 (mg/dL)    Comment 1 Notify RN     GLUCOSE, CAPILLARY     Status: Normal   Collection Time   06/20/11 11:06 AM      Component Value Range Comment   Glucose-Capillary 97  70 - 99 (mg/dL)    Comment 1 STAT Lab       Comment 2 Notify RN     GLUCOSE, CAPILLARY     Status: Abnormal   Collection Time   06/20/11  4:15 PM      Component Value Range Comment   Glucose-Capillary 165 (*) 70 - 99 (  mg/dL)    Comment 1 Documented in Chart     GLUCOSE, CAPILLARY     Status: Abnormal   Collection Time   06/20/11  9:36 PM      Component Value Range Comment   Glucose-Capillary 198 (*) 70 - 99 (mg/dL)   GLUCOSE, CAPILLARY     Status: Abnormal   Collection Time   06/21/11  6:20 AM      Component Value Range Comment   Glucose-Capillary 206 (*) 70 - 99 (mg/dL)   GLUCOSE, CAPILLARY     Status: Abnormal   Collection Time   06/21/11 11:36 AM      Component Value Range Comment   Glucose-Capillary 228 (*) 70 - 99 (mg/dL)     Mr Lumbar Spine Wo Contrast  06/19/2011  *RADIOLOGY REPORT*  Clinical Data: Chronic low back pain with bilateral leg pain and weakness.  No known injury or prior relevant surgery.  MRI LUMBAR SPINE WITHOUT CONTRAST  Technique:  Multiplanar and multiecho pulse sequences of the lumbar spine were obtained without intravenous contrast.  Comparison: Abdominal pelvic CT 06/18/2011.  Findings: There is transitional lumbosacral anatomy with a partially sacralized L5 segment.  There is a left-sided lumbosacral assimilation joint.  Exaggerated lumbar lordosis is again noted without associated listhesis.  There is no evidence of acute fracture or pars defect.  Hemangiomas are noted at T11, L3 and L5.  The conus medullaris extends to the L2 level and appears normal. There are no paraspinal abnormalities.  There are no significant disc space findings from T10-T11 through L1-L2.  Small anterior osteophytes are present at T9-T10 and T10- T11.  L2-L3:  Minimal disc desiccation and bilateral facet hypertrophy. No spinal stenosis or nerve root encroachment.  L3-L4:  Minimal disc desiccation and mild bilateral facet hypertrophy.  No spinal stenosis or nerve root encroachment.  L4-L5: There is mild disc bulging and a slight  anterolisthesis. There is moderate bilateral facet hypertrophy contributing to mild left greater than right superior foraminal narrowing.  Left L4 nerve root encroachment is possible.  Both lateral recesses are mildly narrowed.  There is no focal disc protrusion.  L5-S1:  There is mild disc bulging with mild bilateral facet hypertrophy.  Left-sided lumbosacral assimilation joint is noted. There is no significant spinal stenosis or nerve root encroachment.  IMPRESSION:  1.  Transitional lumbosacral anatomy with a transitional L5 segment and left-sided lumbosacral assimilation joint. 2.  Moderate facet hypertrophy, mild disc bulging and a slight anterolisthesis at L4-L5 contribute to mild biforaminal stenosis and possible left L4 nerve root encroachment. 3.  No other significant spinal stenosis or nerve root encroachment. 4.  No acute osseous findings.  Original Report Authenticated By: Gerrianne Scale, M.D.   Ct Abdomen Pelvis W Contrast  06/18/2011  *RADIOLOGY REPORT*  Clinical Data: Left lower quadrant pain.  Left-sided back pain.  CT ABDOMEN AND PELVIS WITH CONTRAST  Technique:  Multidetector CT imaging of the abdomen and pelvis was performed following the standard protocol during bolus administration of intravenous contrast.  Contrast: OMNIPAQUE IOHEXOL 300 MG/ML IV SOLN  Comparison: None.  Findings: Dependent atelectasis at the lung bases.  Patulous gastroesophageal junction.  Contrast outlines food within the stomach.  The stomach is distended.  Right interpolar simple renal cyst measures 14 mm.  Normal renal enhancement and excretion of contrast.  Both adrenal glands appear within normal limits.  Pancreas is unremarkable aside from mild fatty atrophy of the uncinate.  No calcified gallstones.  Gallbladder appears within normal  limits by CT.  Spleen normal.  Duodenum within normal limits.  Small bowel appears normal without evidence of obstruction.  Hysterectomy. Urinary bladder is normal.  Mild  colonic diverticulosis.  There is no colonic diverticulitis.  Normal appendix.  No aggressive osseous lesions.  Exaggerated lumbar lordosis.  Lower lumbar facet arthrosis.  Degenerative disc disease worst at L5-S1. Small radiolucent lesion in the right side of L5 probably represents hemangioma.  The bilateral SI joint degenerative disease.  IMPRESSION:  1.  No acute abnormality. 2.  Right renal cyst. 3.  Hysterectomy. 4.  Lumbar spondylosis and facet arthrosis.  Original Report Authenticated By: Andreas Newport, M.D.   Ir Epidurography  06/20/2011  *RADIOLOGY REPORT*  CLINICAL DATA:Lumbosacral spondylosis without myelopathy. Displacement of the L4-5 lumbar disc with slight anterolisthesis. Left lower extremity radiculitis.  SELECTIVE NERVE ROOT AND TRANSFORAMINAL EPIDURAL STEROID INJECTION: A transforaminal approach was performed on the left at the L4-5 foramen.  The overlying skin was cleansed and anesthetized.  A 22 gauge spinal needle was advanced into the foramen from a lateral oblique approach.  Injection of 2cc of Omnipaque 180 outlined the nerve root and extended into the epidural space.  No vascular or intrathecal spread is evident.  I then injected 120 mg of Depo-Medrol and 1.5 ml of 1% lidocaine. The patient tolerated the procedure without evidence for complication.  The patient was observed for 20 minutes prior to discharge in stable neurologic condition.  FLUORO TIME:  1.0 minutes  IMPRESSIONS:  Technically successful selective nerve root block and transforaminal epidural steroid injection on theleft at the L4-5 foramen.  Original Report Authenticated By: Jamesetta Orleans. MATTERN, M.D.     Disposition: Discharged home in stable condition  Diet: Heart healthy and diabetic  Activity: Increase activity gradually  Follow-up Appts: Discharge Orders    Future Orders Please Complete By Expires   Diet - low sodium heart healthy      Diet Carb Modified      Increase activity slowly      Call MD  for:  severe uncontrolled pain      Call MD for:  temperature >100.4      Call MD for:  redness, tenderness, or signs of infection (pain, swelling, redness, odor or green/yellow discharge around incision site)         TESTS THAT NEED FOLLOW-UP None  Time spent on discharge, talking to the patient, and coordinating care: 35 mins.   SignedMarcellus Scott, MD 06/21/2011, 5:16 PM

## 2012-12-28 ENCOUNTER — Emergency Department (HOSPITAL_BASED_OUTPATIENT_CLINIC_OR_DEPARTMENT_OTHER): Payer: Medicare HMO

## 2012-12-28 ENCOUNTER — Encounter (HOSPITAL_BASED_OUTPATIENT_CLINIC_OR_DEPARTMENT_OTHER): Payer: Self-pay | Admitting: Family Medicine

## 2012-12-28 ENCOUNTER — Observation Stay (HOSPITAL_BASED_OUTPATIENT_CLINIC_OR_DEPARTMENT_OTHER)
Admission: EM | Admit: 2012-12-28 | Discharge: 2012-12-31 | Disposition: A | Discharge status: Home / Self Care | Payer: Medicare HMO | Attending: Internal Medicine | Admitting: Internal Medicine

## 2012-12-28 DIAGNOSIS — I10 Essential (primary) hypertension: Secondary | ICD-10-CM | POA: Insufficient documentation

## 2012-12-28 DIAGNOSIS — G40909 Epilepsy, unspecified, not intractable, without status epilepticus: Secondary | ICD-10-CM | POA: Insufficient documentation

## 2012-12-28 DIAGNOSIS — E119 Type 2 diabetes mellitus without complications: Secondary | ICD-10-CM | POA: Insufficient documentation

## 2012-12-28 DIAGNOSIS — E876 Hypokalemia: Secondary | ICD-10-CM | POA: Insufficient documentation

## 2012-12-28 DIAGNOSIS — R079 Chest pain, unspecified: Principal | ICD-10-CM | POA: Insufficient documentation

## 2012-12-28 DIAGNOSIS — Z794 Long term (current) use of insulin: Secondary | ICD-10-CM | POA: Insufficient documentation

## 2012-12-28 DIAGNOSIS — Z9989 Dependence on other enabling machines and devices: Secondary | ICD-10-CM | POA: Diagnosis present

## 2012-12-28 DIAGNOSIS — M549 Dorsalgia, unspecified: Secondary | ICD-10-CM

## 2012-12-28 DIAGNOSIS — Z6841 Body Mass Index (BMI) 40.0 and over, adult: Secondary | ICD-10-CM | POA: Insufficient documentation

## 2012-12-28 DIAGNOSIS — G4733 Obstructive sleep apnea (adult) (pediatric): Secondary | ICD-10-CM | POA: Diagnosis present

## 2012-12-28 DIAGNOSIS — Z79899 Other long term (current) drug therapy: Secondary | ICD-10-CM | POA: Insufficient documentation

## 2012-12-28 DIAGNOSIS — R0602 Shortness of breath: Secondary | ICD-10-CM | POA: Insufficient documentation

## 2012-12-28 HISTORY — DX: Type 2 diabetes mellitus without complications: E11.9

## 2012-12-28 HISTORY — DX: Dependence on other enabling machines and devices: Z99.89

## 2012-12-28 HISTORY — DX: Other generalized epilepsy and epileptic syndromes, not intractable, without status epilepticus: G40.409

## 2012-12-28 HISTORY — DX: Low back pain: M54.5

## 2012-12-28 HISTORY — DX: Unspecified chronic bronchitis: J42

## 2012-12-28 HISTORY — DX: Cardiomegaly: I51.7

## 2012-12-28 HISTORY — DX: Gastro-esophageal reflux disease without esophagitis: K21.9

## 2012-12-28 HISTORY — DX: Unspecified osteoarthritis, unspecified site: M19.90

## 2012-12-28 HISTORY — DX: Low back pain, unspecified: M54.50

## 2012-12-28 HISTORY — DX: Obstructive sleep apnea (adult) (pediatric): G47.33

## 2012-12-28 HISTORY — DX: Other chronic pain: G89.29

## 2012-12-28 HISTORY — DX: Headache: R51

## 2012-12-28 LAB — COMPREHENSIVE METABOLIC PANEL
Albumin: 3.6 g/dL (ref 3.5–5.2)
Alkaline Phosphatase: 80 U/L (ref 39–117)
Alkaline Phosphatase: 74 U/L (ref 39–117)
BUN: 18 mg/dL (ref 6–23)
BUN: 18 mg/dL (ref 6–23)
Creatinine, Ser: 1 mg/dL (ref 0.50–1.10)
GFR calc Af Amer: 68 mL/min — ABNORMAL LOW (ref >90)
Glucose, Bld: 149 mg/dL — ABNORMAL HIGH (ref 70–99)
Potassium: 3.4 mEq/L — ABNORMAL LOW (ref 3.5–5.1)
Potassium: 3.7 mEq/L (ref 3.5–5.1)
Total Bilirubin: 0.2 mg/dL — ABNORMAL LOW (ref 0.3–1.2)
Total Protein: 7.4 g/dL (ref 6.0–8.3)
Total Protein: 6.9 g/dL (ref 6.0–8.3)

## 2012-12-28 LAB — LIPID PANEL
Total CHOL/HDL Ratio: 2.5 RATIO
VLDL: 24 mg/dL (ref 0–40)

## 2012-12-28 LAB — D-DIMER, QUANTITATIVE: D-Dimer, Quant: 0.39 ug/mL-FEU (ref 0.00–0.48)

## 2012-12-28 LAB — TROPONIN I
Troponin I: <0.3 ng/mL (ref <0.30)
Troponin I: <0.3 ng/mL (ref <0.30)
Troponin I: <0.3 ng/mL (ref <0.30)

## 2012-12-28 LAB — CBC WITH DIFFERENTIAL/PLATELET
Basophils Absolute: 0 10*3/uL (ref 0.0–0.1)
Basophils Absolute: 0 10*3/uL (ref 0.0–0.1)
Basophils Relative: 0 % (ref 0–1)
Eosinophils Absolute: 0.1 10*3/uL (ref 0.0–0.7)
Eosinophils Absolute: 0.1 10*3/uL (ref 0.0–0.7)
Eosinophils Relative: 1 % (ref 0–5)
HCT: 39.9 % (ref 36.0–46.0)
Hemoglobin: 13.9 g/dL (ref 12.0–15.0)
Lymphocytes Relative: 23 % (ref 12–46)
MCH: 28.7 pg (ref 26.0–34.0)
MCH: 29.3 pg (ref 26.0–34.0)
MCHC: 32.9 g/dL (ref 30.0–36.0)
MCHC: 33.6 g/dL (ref 30.0–36.0)
MCV: 87.1 fL (ref 78.0–100.0)
Monocytes Absolute: 0.6 10*3/uL (ref 0.1–1.0)
Monocytes Relative: 7 % (ref 3–12)
Neutrophils Relative %: 73 % (ref 43–77)
RDW: 14.8 % (ref 11.5–15.5)
RDW: 14.7 % (ref 11.5–15.5)
WBC: 11.3 10*3/uL — ABNORMAL HIGH (ref 4.0–10.5)

## 2012-12-28 LAB — GLUCOSE, CAPILLARY: Glucose-Capillary: 135 mg/dL — ABNORMAL HIGH (ref 70–99)

## 2012-12-28 LAB — MAGNESIUM: Magnesium: 2.1 mg/dL (ref 1.5–2.5)

## 2012-12-28 LAB — HEMOGLOBIN A1C: Mean Plasma Glucose: 174 mg/dL — ABNORMAL HIGH (ref <117)

## 2012-12-28 MED ORDER — METOPROLOL TARTRATE 12.5 MG HALF TABLET
12.5000 mg | ORAL_TABLET | Freq: Two times a day (BID) | ORAL | Status: DC
  Administered 2012-12-28 – 2012-12-31 (×4): 12.5 mg via ORAL
  Filled 2012-12-28 (×7): qty 1

## 2012-12-28 MED ORDER — INSULIN ASPART 100 UNIT/ML ~~LOC~~ SOLN
0.0000 [IU] | SUBCUTANEOUS | Status: DC
  Administered 2012-12-29: 5 [IU] via SUBCUTANEOUS
  Administered 2012-12-30: 2 [IU] via SUBCUTANEOUS
  Administered 2012-12-30 (×3): 3 [IU] via SUBCUTANEOUS
  Administered 2012-12-31: 5 [IU] via SUBCUTANEOUS
  Administered 2012-12-31: 2 [IU] via SUBCUTANEOUS
  Administered 2012-12-31 (×2): 3 [IU] via SUBCUTANEOUS

## 2012-12-28 MED ORDER — LISINOPRIL 20 MG PO TABS
20.0000 mg | ORAL_TABLET | Freq: Every day | ORAL | Status: DC
  Administered 2012-12-28 – 2012-12-31 (×4): 20 mg via ORAL
  Filled 2012-12-28 (×4): qty 1

## 2012-12-28 MED ORDER — HYDROCHLOROTHIAZIDE 25 MG PO TABS: 25.0000 mg | ORAL_TABLET | Freq: Every day | ORAL | Status: DC

## 2012-12-28 MED ORDER — NITROGLYCERIN 0.4 MG SL SUBL: 0.4000 mg | SUBLINGUAL_TABLET | SUBLINGUAL | Status: DC | PRN

## 2012-12-28 MED ORDER — ASPIRIN 300 MG RE SUPP: 300.0000 mg | RECTAL | Status: AC

## 2012-12-28 MED ORDER — TOPIRAMATE 100 MG PO TABS
300.0000 mg | ORAL_TABLET | Freq: Two times a day (BID) | ORAL | Status: DC
  Administered 2012-12-28 – 2012-12-31 (×6): 300 mg via ORAL
  Filled 2012-12-28 (×8): qty 3

## 2012-12-28 MED ORDER — ASPIRIN 81 MG PO CHEW
CHEWABLE_TABLET | ORAL | Status: AC
  Filled 2012-12-28: qty 4

## 2012-12-28 MED ORDER — ENOXAPARIN SODIUM 40 MG/0.4ML ~~LOC~~ SOLN
40.0000 mg | SUBCUTANEOUS | Status: DC
  Filled 2012-12-28: qty 0.4

## 2012-12-28 MED ORDER — ONDANSETRON HCL 4 MG/2ML IJ SOLN
4.0000 mg | Freq: Four times a day (QID) | INTRAMUSCULAR | Status: DC | PRN
  Administered 2012-12-29: 4 mg via INTRAVENOUS
  Filled 2012-12-28: qty 2

## 2012-12-28 MED ORDER — HEPARIN BOLUS VIA INFUSION
4000.0000 [IU] | Freq: Once | INTRAVENOUS | Status: AC
  Administered 2012-12-28: 4000 [IU] via INTRAVENOUS
  Filled 2012-12-28: qty 4000

## 2012-12-28 MED ORDER — HEPARIN (PORCINE) IN NACL 100-0.45 UNIT/ML-% IJ SOLN
1300.0000 [IU]/h | INTRAMUSCULAR | Status: DC
  Administered 2012-12-28: 1300 [IU]/h via INTRAVENOUS
  Filled 2012-12-28 (×2): qty 250

## 2012-12-28 MED ORDER — ALBUTEROL SULFATE HFA 108 (90 BASE) MCG/ACT IN AERS
2.0000 | INHALATION_SPRAY | Freq: Four times a day (QID) | RESPIRATORY_TRACT | Status: DC | PRN
  Filled 2012-12-28: qty 6.7

## 2012-12-28 MED ORDER — NITROGLYCERIN 2 % TD OINT
TOPICAL_OINTMENT | TRANSDERMAL | Status: AC
  Administered 2012-12-28: 1 [in_us] via TOPICAL
  Filled 2012-12-28: qty 1

## 2012-12-28 MED ORDER — ASPIRIN 81 MG PO CHEW: 324.0000 mg | CHEWABLE_TABLET | ORAL | Status: AC

## 2012-12-28 MED ORDER — NITROGLYCERIN 0.4 MG SL SUBL
0.4000 mg | SUBLINGUAL_TABLET | SUBLINGUAL | Status: DC | PRN
  Administered 2012-12-28 (×2): 0.4 mg via SUBLINGUAL
  Filled 2012-12-28: qty 25

## 2012-12-28 MED ORDER — MORPHINE SULFATE 2 MG/ML IJ SOLN
2.0000 mg | INTRAMUSCULAR | Status: DC | PRN
  Administered 2012-12-28 – 2012-12-29 (×2): 2 mg via INTRAVENOUS
  Filled 2012-12-28 (×2): qty 1

## 2012-12-28 MED ORDER — GABAPENTIN 300 MG PO CAPS
300.0000 mg | ORAL_CAPSULE | Freq: Three times a day (TID) | ORAL | Status: DC
  Administered 2012-12-28 – 2012-12-31 (×9): 300 mg via ORAL
  Filled 2012-12-28 (×10): qty 1

## 2012-12-28 MED ORDER — ONDANSETRON HCL 4 MG/2ML IJ SOLN: 4.0000 mg | Freq: Three times a day (TID) | INTRAMUSCULAR | Status: DC | PRN

## 2012-12-28 MED ORDER — NITROGLYCERIN 2 % TD OINT
1.0000 [in_us] | TOPICAL_OINTMENT | Freq: Four times a day (QID) | TRANSDERMAL | Status: DC
  Administered 2012-12-28 – 2012-12-31 (×8): 1 [in_us] via TOPICAL
  Filled 2012-12-28 (×29): qty 30

## 2012-12-28 MED ORDER — DORZOLAMIDE HCL 2 % OP SOLN
1.0000 [drp] | Freq: Three times a day (TID) | OPHTHALMIC | Status: DC
  Administered 2012-12-28 – 2012-12-31 (×9): 1 [drp] via OPHTHALMIC
  Filled 2012-12-28: qty 10

## 2012-12-28 MED ORDER — ASPIRIN EC 81 MG PO TBEC
81.0000 mg | DELAYED_RELEASE_TABLET | Freq: Every day | ORAL | Status: DC
  Administered 2012-12-29 – 2012-12-31 (×3): 81 mg via ORAL
  Filled 2012-12-28 (×3): qty 1

## 2012-12-28 MED ORDER — MORPHINE SULFATE 2 MG/ML IJ SOLN: 2.0000 mg | Freq: Once | INTRAMUSCULAR | Status: DC

## 2012-12-28 MED ORDER — ATORVASTATIN CALCIUM 80 MG PO TABS
80.0000 mg | ORAL_TABLET | Freq: Every day | ORAL | Status: DC
  Administered 2012-12-29 – 2012-12-31 (×3): 80 mg via ORAL
  Filled 2012-12-28 (×4): qty 1

## 2012-12-28 MED ORDER — ACETAMINOPHEN 325 MG PO TABS
650.0000 mg | ORAL_TABLET | ORAL | Status: DC | PRN
  Administered 2012-12-30: 650 mg via ORAL
  Filled 2012-12-28: qty 2

## 2012-12-28 MED ORDER — FAMOTIDINE 20 MG PO TABS
20.0000 mg | ORAL_TABLET | Freq: Two times a day (BID) | ORAL | Status: DC
  Administered 2012-12-28 – 2012-12-31 (×6): 20 mg via ORAL
  Filled 2012-12-28 (×7): qty 1

## 2012-12-28 MED ORDER — POTASSIUM CHLORIDE CRYS ER 10 MEQ PO TBCR
10.0000 meq | EXTENDED_RELEASE_TABLET | Freq: Two times a day (BID) | ORAL | Status: DC
  Administered 2012-12-28 – 2012-12-31 (×6): 10 meq via ORAL
  Filled 2012-12-28 (×7): qty 1

## 2012-12-28 MED ORDER — ASPIRIN 81 MG PO CHEW: 81.0000 mg | CHEWABLE_TABLET | Freq: Every day | ORAL | Status: DC

## 2012-12-28 MED ORDER — TOPIRAMATE 100 MG PO TABS: 200.0000 mg | ORAL_TABLET | Freq: Two times a day (BID) | ORAL | Status: DC

## 2012-12-28 NOTE — Progress Notes (Signed)
Patient with history of hypertension, diabetes, obesity presents from PCPs office to [facility] pt ED with 3 day hx of intermittent CP radiating to LUE with both typical and atypical pain and shortness of breath. EKG with nonspecific ST-T wave changes otherwise normal sinus rhythm. D. dimer negative. Be met with slight hypokalemia otherwise fine. First of troponin was negative. Patient to be admitted to cardiac telemetry at [facility] for ACS workup.

## 2012-12-28 NOTE — Progress Notes (Signed)
ANTICOAGULATION CONSULT NOTE - Initial Consult  Pharmacy Consult for Heparin Indication: chest pain/ACS  No Known Allergies  Patient Measurements: Weight = approximately 109 kg Height = 61 inches Heparin Dosing Weight: 66 kg  Labs:  Recent Labs  12/28/12 1155  HGB 13.9  HCT 42.2  PLT 272  CREATININE 1.00  TROPONINI <0.30    The CrCl is unknown because both a height and weight (above a minimum accepted value) are required for this calculation.   Medical History: Past Medical History  Diagnosis Date  . Hypertension   . GERD (gastroesophageal reflux disease)   . Enlarged heart   . Chronic bronchitis     "get it q yr" (12/28/2012)  . OSA on CPAP   . Type II diabetes mellitus   . WUJWJXBJ(478.2)     "maybe 2/month" (12/28/2012)  . Grand mal seizure ~ 1962    "last sz was 08/2012; long time before that" (12/28/2012)  . Arthritis     "all over my body" (12/28/2012)  . Chronic lower back pain     Assessment: Obese female admitted with a three day history of intermittent chest pain and shortness of breath.  Admitted for r/o MI, first troponin negative.  Scr stable at 1, no anti-coagulation prior to admission  Goal of Therapy:  Heparin level 0.3-0.7 units/ml Monitor platelets by anticoagulation protocol: Yes   Plan:  1) Heparin 4000 units iv bolus x 1 2) Heparin drip at 1300 units / hr 3) Heparin level 6 hours after heparin begins 4) Daily heparin level, CBC  Thank you. Okey Regal, PharmD [phone]  12/28/2012,5:54 PM

## 2012-12-28 NOTE — Plan of Care (Signed)
Problem: Phase I Progression Outcomes Goal: Aspirin unless contraindicated Outcome: Completed/Met Date Met:  12/28/12 Given at MDs office

## 2012-12-28 NOTE — ED Provider Notes (Signed)
That whole computer was screwed up after some updated patient is and as listed on or replaced the actual  History    CSN: [id number] Arrival date & time 12/28/12  1140  First MD Initiated Contact with Patient 12/28/12 1153     Chief Complaint  Patient presents with  . Chest Pain   (Consider location/radiation/quality/duration/timing/severity/associated sxs/prior Treatment) HPI Comments: Obese female with three-day history of intermittent chest pain or shortness of breath. Pain comes and goes lasting about 5 minutes at a time it radiates down her left arm. He sent from her PCPs office for further evaluation. She reports no cardiac history. She had a stress test many years ago. The pain is somewhat worse with palpation but not completely reproduced. Denies any leg pain or swelling. The pain comes on at rest and with exertion. It is somewhat worse with palpation and somewhat worse with arm movement.  The history is provided by the patient.   Past Medical History  Diagnosis Date  . Hypertension   . Bronchitis   . Reflux   . Diabetes mellitus   . Seizures    Past Surgical History  Procedure Laterality Date  . Abdominal hysterectomy     Family History  Problem Relation Age of Onset  . Diabetes type II Sister   . Coronary artery disease Sister    History  Substance Use Topics  . Smoking status: Never Smoker   . Smokeless tobacco: Not on file  . Alcohol Use: No   OB History   Grav Para Term Preterm Abortions TAB SAB Ect Mult Living                 Review of Systems  Constitutional: Positive for activity change and appetite change. Negative for fever and fatigue.  HENT: Negative for congestion and rhinorrhea.   Respiratory: Positive for chest tightness and shortness of breath. Negative for cough.   Cardiovascular: Positive for chest pain. Negative for leg swelling.  Gastrointestinal: Negative for nausea, vomiting and abdominal pain.  Genitourinary: Negative for dysuria,  hematuria, vaginal bleeding and vaginal discharge.  Skin: Negative for rash.  Neurological: Negative for dizziness, weakness, light-headedness and headaches.  A complete 10 system review of systems was obtained and all systems are negative except as noted in the HPI and PMH.    Allergies  Review of patient's allergies indicates no known allergies.  Home Medications   No current outpatient prescriptions on file. BP 125/75  Pulse 87  Temp(Src) 98 F (36.7 C) (Oral)  Resp 20  SpO2 100% Physical Exam  Constitutional: She is oriented to person, place, and time. She appears well-developed and well-nourished. No distress.  HENT:  Head: Normocephalic and atraumatic.  Mouth/Throat: Oropharynx is clear and moist. No oropharyngeal exudate.  Eyes: Conjunctivae and EOM are normal. Pupils are equal, round, and reactive to light.  Neck: Normal range of motion. Neck supple.  Cardiovascular: Normal rate, regular rhythm and normal heart sounds.   No murmur heard. Pulmonary/Chest: Effort normal and breath sounds normal. No respiratory distress. She exhibits tenderness.  L sided chest tenderness  Abdominal: Soft. There is no tenderness. There is no rebound and no guarding.  Musculoskeletal: Normal range of motion. She exhibits no edema and no tenderness.  Neurological: She is alert and oriented to person, place, and time. No cranial nerve deficit. She exhibits normal muscle tone. Coordination normal.  Skin: Skin is warm.    ED Course  Procedures (including critical care time) Labs Reviewed  CBC WITH  DIFFERENTIAL - Abnormal; Notable for the following:    WBC 11.8 (*)    Neutro Abs 8.7 (*)    All other components within normal limits  COMPREHENSIVE METABOLIC PANEL - Abnormal; Notable for the following:    Potassium 3.4 (*)    Glucose, Bld 144 (*)    GFR calc non Af Amer 58 (*)    GFR calc Af Amer 67 (*)    All other components within normal limits  TROPONIN I  D-DIMER, QUANTITATIVE  PRO B  NATRIURETIC PEPTIDE   Dg Chest 2 View  12/28/2012   *RADIOLOGY REPORT*  Clinical Data: Chest pain and shortness of breath.  CHEST - 2 VIEW  Comparison: No priors.  Findings: Lung volumes are normal.  No consolidative airspace disease.  No pleural effusions.  No pneumothorax.  No pulmonary nodule or mass noted.  Pulmonary vasculature and the cardiomediastinal silhouette are within normal limits.  IMPRESSION: 1. No radiographic evidence of acute cardiopulmonary disease.   Original Report Authenticated By: Trudie Reed, M.D.   1. Chest pain     MDM  3 days of intermittent chest pain or shortness of breath. EKG with nonspecific T-wave flattening. No previous cardiac history.  Troponin negative. D-dimer negative. Some elements of history concerning for ACS, some elements atypical. Pain is somewhat reproducible. Patient's obesity, hypertension, hyperlipidemia and diabetes, rule out is recommended. Her heart score is 4. Observation admission d/w Dr. Janee Morn.     Date: 12/28/2012  Rate: 86  Rhythm: normal sinus rhythm  QRS Axis: normal  Intervals: normal  ST/T Wave abnormalities: nonspecific ST/T changes  Conduction Disutrbances:none  Narrative Interpretation:   Old EKG Reviewed: none available     Glynn Octave, MD 12/28/12 1545

## 2012-12-28 NOTE — Progress Notes (Signed)
Pt. Refuses CPAP at this time. Pt. Was made aware to let RT know if she changes her mind anytime during the night & decides to wear CPAP. RN is aware.

## 2012-12-28 NOTE — ED Notes (Signed)
Patient c/o chest pain after ambulating to restroom & orders were obtained for one inch of nitro paste prior to transport

## 2012-12-28 NOTE — Consult Note (Signed)
Reason for Consult:Recurrent chest pain Referring Physician:[facility]  [name] [name] is an 67 y.o. female.  UXL:[id number] is 67 year old female with past medical history significant for hypertension, diabetes matters, obstructive sleep apnea, on nasal CPAP, morbid obesity, positive family history of coronary artery disease, sister had MI in her 90s, GERD,Degenerative joint disease with chronic lower back pain, history of grand mal seizures since childhood, was admitted because of recurrent retrosternal chest pain radiating to left shoulder and arm off and on sharp grade 8/10 associated with mild shortness of breath. Patient states occasionally chest pain increases with deep breathing. Denies any nausea or vomiting but complains of diaphoresis. Denies any palpitation lightheadedness or syncope. States her activity is very limited due to her fear of getting seizures so she does not go out. States had stress test a few years ago which was negative for ischemia. Patient did receive her sublingual nitroglycerin and then was started on nitro paste with her improvement in her chest pain. Patient presently denies any pain. EKG showed normal sinus rhythm with nonspecific T wave changes.  Past Medical History  Diagnosis Date  . Hypertension   . GERD (gastroesophageal reflux disease)   . Enlarged heart   . Chronic bronchitis     "get it q yr" (12/28/2012)  . OSA on CPAP   . Type II diabetes mellitus   . VOZDGUYQ(034.7)     "maybe 2/month" (12/28/2012)  . Grand mal seizure ~ 1962    "last sz was 08/2012; long time before that" (12/28/2012)  . Arthritis     "all over my body" (12/28/2012)  . Chronic lower back pain     Past Surgical History  Procedure Laterality Date  . Abdominal hysterectomy  1970's  . Dilation and curettage of uterus  1970's  . Cataract extraction w/ intraocular lens  implant, bilateral Bilateral ~ 2012    Family History  Problem Relation Age of Onset  . Diabetes type II Sister    . Coronary artery disease Sister     Social History:  reports that she has never smoked. She has never used smokeless tobacco. She reports that she does not drink alcohol or use illicit drugs.  Allergies: No Known Allergies  Medications: I have reviewed the patient's current medications.  Results for orders placed during the hospital encounter of 12/28/12 (from the past 48 hour(s))  CBC WITH DIFFERENTIAL     Status: Abnormal   Collection Time    12/28/12 11:55 AM      Result Value Range   WBC 11.8 (*) 4.0 - 10.5 K/uL   RBC 4.85  3.87 - 5.11 MIL/uL   Hemoglobin 13.9  12.0 - 15.0 g/dL   HCT 42.5  95.6 - 38.7 %   MCV 87.0  78.0 - 100.0 fL   MCH 28.7  26.0 - 34.0 pg   MCHC 32.9  30.0 - 36.0 g/dL   RDW 56.4  33.2 - 95.1 %   Platelets 272  150 - 400 K/uL   Neutrophils Relative % 73  43 - 77 %   Neutro Abs 8.7 (*) 1.7 - 7.7 K/uL   Lymphocytes Relative 19  12 - 46 %   Lymphs Abs 2.3  0.7 - 4.0 K/uL   Monocytes Relative 7  3 - 12 %   Monocytes Absolute 0.8  0.1 - 1.0 K/uL   Eosinophils Relative 1  0 - 5 %   Eosinophils Absolute 0.1  0.0 - 0.7 K/uL   Basophils Relative 0  0 - 1 %   Basophils Absolute 0.0  0.0 - 0.1 K/uL  COMPREHENSIVE METABOLIC PANEL     Status: Abnormal   Collection Time    12/28/12 11:55 AM      Result Value Range   Sodium 141  135 - 145 mEq/L   Potassium 3.4 (*) 3.5 - 5.1 mEq/L   Chloride 101  96 - 112 mEq/L   CO2 29  19 - 32 mEq/L   Glucose, Bld 144 (*) 70 - 99 mg/dL   BUN 18  6 - 23 mg/dL   Creatinine, Ser 1.47  0.50 - 1.10 mg/dL   Calcium 82.9  8.4 - 56.2 mg/dL   Total Protein 7.4  6.0 - 8.3 g/dL   Albumin 3.6  3.5 - 5.2 g/dL   AST 15  0 - 37 U/L   ALT 12  0 - 35 U/L   Alkaline Phosphatase 80  39 - 117 U/L   Total Bilirubin 0.3  0.3 - 1.2 mg/dL   GFR calc non Af Amer 58 (*) >90 mL/min   GFR calc Af Amer 67 (*) >90 mL/min   Comment:            The eGFR has been calculated     using the CKD EPI equation.     This calculation has not been      validated in all clinical     situations.     eGFR's persistently     <90 mL/min signify     possible Chronic Kidney Disease.  TROPONIN I     Status: None   Collection Time    12/28/12 11:55 AM      Result Value Range   Troponin I <0.30  <0.30 ng/mL   Comment:            Due to the release kinetics of cTnI,     a negative result within the first hours     of the onset of symptoms does not rule out     myocardial infarction with certainty.     If myocardial infarction is still suspected,     repeat the test at appropriate intervals.  D-DIMER, QUANTITATIVE     Status: None   Collection Time    12/28/12 11:55 AM      Result Value Range   D-Dimer, Quant 0.39  0.00 - 0.48 ug/mL-FEU   Comment:            AT THE INHOUSE ESTABLISHED CUTOFF     VALUE OF 0.48 ug/mL FEU,     THIS ASSAY HAS BEEN DOCUMENTED     IN THE LITERATURE TO HAVE     A SENSITIVITY AND NEGATIVE     PREDICTIVE VALUE OF AT LEAST     98 TO 99%.  THE TEST RESULT     SHOULD BE CORRELATED WITH     AN ASSESSMENT OF THE CLINICAL     PROBABILITY OF DVT / VTE.  PRO B NATRIURETIC PEPTIDE     Status: None   Collection Time    12/28/12 11:55 AM      Result Value Range   Pro B Natriuretic peptide (BNP) 87.5  0 - 125 pg/mL  TROPONIN I     Status: None   Collection Time    12/28/12  4:22 PM      Result Value Range   Troponin I <0.30  <0.30 ng/mL   Comment:  Due to the release kinetics of cTnI,     a negative result within the first hours     of the onset of symptoms does not rule out     myocardial infarction with certainty.     If myocardial infarction is still suspected,     repeat the test at appropriate intervals.  PROTIME-INR     Status: None   Collection Time    12/28/12  4:22 PM      Result Value Range   Prothrombin Time 12.8  11.6 - 15.2 seconds   INR 0.98  0.00 - 1.49  CBC WITH DIFFERENTIAL     Status: Abnormal   Collection Time    12/28/12  4:22 PM      Result Value Range   WBC 11.3 (*) 4.0 -  10.5 K/uL   RBC 4.58  3.87 - 5.11 MIL/uL   Hemoglobin 13.4  12.0 - 15.0 g/dL   HCT 25.3  66.4 - 40.3 %   MCV 87.1  78.0 - 100.0 fL   MCH 29.3  26.0 - 34.0 pg   MCHC 33.6  30.0 - 36.0 g/dL   RDW 47.4  25.9 - 56.3 %   Platelets 291  150 - 400 K/uL   Neutrophils Relative % 70  43 - 77 %   Neutro Abs 8.2 (*) 1.7 - 7.7 K/uL   Lymphocytes Relative 23  12 - 46 %   Lymphs Abs 2.7  0.7 - 4.0 K/uL   Monocytes Relative 5  3 - 12 %   Monocytes Absolute 0.6  0.1 - 1.0 K/uL   Eosinophils Relative 1  0 - 5 %   Eosinophils Absolute 0.1  0.0 - 0.7 K/uL   Basophils Relative 0  0 - 1 %   Basophils Absolute 0.0  0.0 - 0.1 K/uL  COMPREHENSIVE METABOLIC PANEL     Status: Abnormal   Collection Time    12/28/12  4:22 PM      Result Value Range   Sodium 141  135 - 145 mEq/L   Potassium 3.7  3.5 - 5.1 mEq/L   Chloride 101  96 - 112 mEq/L   CO2 32  19 - 32 mEq/L   Glucose, Bld 149 (*) 70 - 99 mg/dL   BUN 18  6 - 23 mg/dL   Creatinine, Ser 8.75  0.50 - 1.10 mg/dL   Calcium 9.8  8.4 - 64.3 mg/dL   Total Protein 6.9  6.0 - 8.3 g/dL   Albumin 3.3 (*) 3.5 - 5.2 g/dL   AST 15  0 - 37 U/L   ALT 11  0 - 35 U/L   Alkaline Phosphatase 74  39 - 117 U/L   Total Bilirubin 0.2 (*) 0.3 - 1.2 mg/dL   GFR calc non Af Amer 59 (*) >90 mL/min   GFR calc Af Amer 68 (*) >90 mL/min   Comment:            The eGFR has been calculated     using the CKD EPI equation.     This calculation has not been     validated in all clinical     situations.     eGFR's persistently     <90 mL/min signify     possible Chronic Kidney Disease.  MAGNESIUM     Status: None   Collection Time    12/28/12  4:22 PM      Result Value Range   Magnesium 2.1  1.5 - 2.5 mg/dL  PRO B NATRIURETIC PEPTIDE     Status: None   Collection Time    12/28/12  4:22 PM      Result Value Range   Pro B Natriuretic peptide (BNP) 89.5  0 - 125 pg/mL  LIPID PANEL     Status: None   Collection Time    12/28/12  4:23 PM      Result Value Range    Cholesterol 157  0 - 200 mg/dL   Triglycerides 161  <096 mg/dL   HDL 63  >04 mg/dL   Total CHOL/HDL Ratio 2.5     VLDL 24  0 - 40 mg/dL   LDL Cholesterol 70  0 - 99 mg/dL   Comment:            Total Cholesterol/HDL:CHD Risk     Coronary Heart Disease Risk Table                         Men   Women      1/2 Average Risk   3.4   3.3      Average Risk       5.0   4.4      2 X Average Risk   9.6   7.1      3 X Average Risk  23.4   11.0                Use the calculated Patient Ratio     above and the CHD Risk Table     to determine the patient's CHD Risk.                ATP III CLASSIFICATION (LDL):      <100     mg/dL   Optimal      540-981  mg/dL   Near or Above                        Optimal      130-159  mg/dL   Borderline      191-478  mg/dL   High      >295     mg/dL   Very High  GLUCOSE, CAPILLARY     Status: Abnormal   Collection Time    12/28/12  5:01 PM      Result Value Range   Glucose-Capillary 135 (*) 70 - 99 mg/dL    Dg Chest 2 View  11/28/3084   *RADIOLOGY REPORT*  Clinical Data: Chest pain and shortness of breath.  CHEST - 2 VIEW  Comparison: No priors.  Findings: Lung volumes are normal.  No consolidative airspace disease.  No pleural effusions.  No pneumothorax.  No pulmonary nodule or mass noted.  Pulmonary vasculature and the cardiomediastinal silhouette are within normal limits.  IMPRESSION: 1. No radiographic evidence of acute cardiopulmonary disease.   Original Report Authenticated By: Trudie Reed, M.D.    Review of Systems  Constitutional: Negative for fever and chills.  Eyes: Negative for blurred vision, double vision and photophobia.  Cardiovascular: Positive for chest pain. Negative for palpitations, orthopnea, claudication and leg swelling.  Gastrointestinal: Negative for nausea, vomiting and abdominal pain.  Neurological: Negative for dizziness and headaches.   Blood pressure 124/57, pulse 79, temperature 98.1 F (36.7 C), temperature source  Oral, resp. rate 18, SpO2 99.00%. Physical Exam  Constitutional: She is oriented to person, place, and time. She appears well-nourished.  HENT:  Head: Normocephalic and atraumatic.  Eyes: Conjunctivae are normal. Pupils are equal, round, and reactive to light. Left eye exhibits no discharge. No scleral icterus.  Neck: Normal range of motion. Neck supple. No JVD present. No tracheal deviation present. No thyromegaly present.  Cardiovascular: Normal rate, regular rhythm, normal heart sounds and intact distal pulses.  Exam reveals no friction rub.   No murmur heard. Respiratory: Effort normal and breath sounds normal. No respiratory distress. She has no wheezes. She has no rales.  GI: Soft. Bowel sounds are normal. She exhibits no distension. There is no tenderness. There is no rebound.  Musculoskeletal: She exhibits no edema and no tenderness.  Lymphadenopathy:    She has no cervical adenopathy.  Neurological: She is alert and oriented to person, place, and time.    Assessment/Plan: Atypical chest pain rule out MI Hypertension Diabetes mellitus Obstructive sleep apnea Morbid Obesity History of seizure disorder Positive family history of coronary artery disease Plan Agree with present management Serial enzymes and EKG Hold diuretics for now add low-dose beta blockers We'll schedule for nuclear stress test in a.m.   Robynn Pane 12/28/2012, 6:19 PM

## 2012-12-28 NOTE — H&P (Signed)
[facility] History and Physical  Labrea Eccleston JWJ:[id number] DOB: 1945/06/16 DOA: 12/28/2012  Referring physician: Rondall Allegra ED PCP: No primary provider on file.  Specialists: Cardiology  Chief Complaint: Chest Pain  HPI: [name] [name] is a 67 y.o. BF PMHx seizures, OSA, HTN, DM, asthma patient states has been having approximately 2 weeks of increasing waxing and waning chest pain/chest pain substernal sharp, positive radiation to the left shoulder, positive presyncope, positive diaphoresis, negative nausea, positive shortness of breath. States inciting factor to visit her PCP was the increasing severity of the pain in decreasing her time between episodes. The patient was seen by her PCP his name is Dr. Greggory Stallion  [facility] patient was given 2NTG sublingual, and nitroglycerin paste without total resolution of pain. States currently her pain is a 5/10 substernal. Patient stated her last meal was at 11:30 this morning, and has not had her a.m. Medication.  Note labs drawn at [facility] D. dimer negative, slight hypokalemia, First of troponin was negative.     Review of Systems: The patient denies anorexia, fever, weight loss,, vision loss, decreased hearing, hoarseness, , syncope,  peripheral edema, balance deficits, hemoptysis, abdominal pain, melena, hematochezia, severe indigestion/heartburn, hematuria, incontinence, genital sores, muscle weakness, suspicious skin lesions, transient blindness, difficulty walking, depression, unusual weight change, abnormal bleeding, enlarged lymph nodes, angioedema, and breast masses.    Past Medical History  Diagnosis Date  . Hypertension   . Bronchitis   . Reflux   . Diabetes mellitus   . Seizures    Past Surgical History  Procedure Laterality Date  . Abdominal hysterectomy     Social History:  reports that she has never smoked. She does not have any smokeless tobacco history on file. She reports that she does not drink alcohol or use illicit  drugs.No Known Allergies     Family History  Problem Relation Age of Onset  . Diabetes type II Sister   . Coronary artery disease Sister     Mother, aunt, sister HTN Mother, aunt DM Sister MI @ unknown age    Prior to Admission medications   Medication Sig Start Date End Date Taking? Authorizing Provider  albuterol (PROVENTIL HFA;VENTOLIN HFA) 108 (90 BASE) MCG/ACT inhaler Inhale 2 puffs into the lungs every 6 (six) hours as needed for wheezing.   Yes Historical Provider, MD  aspirin 81 MG chewable tablet Chew 81 mg by mouth daily.   Yes Historical Provider, MD  Cholecalciferol (VITAMIN D PO) Take 1 tablet by mouth daily.   Yes Historical Provider, MD  dorzolamide (TRUSOPT) 2 % ophthalmic solution Place 1 drop into both eyes 3 (three) times daily.   Yes Historical Provider, MD  gabapentin (NEURONTIN) 300 MG capsule Take 300 mg by mouth 3 (three) times daily.   Yes Historical Provider, MD  hydrochlorothiazide (HYDRODIURIL) 25 MG tablet Take 25 mg by mouth daily.     Yes Historical Provider, MD  insulin glargine (LANTUS) 100 UNIT/ML injection Inject 60 Units into the skin at bedtime.    Yes Historical Provider, MD  lisinopril (PRINIVIL,ZESTRIL) 20 MG tablet Take 20 mg by mouth daily.     Yes Historical Provider, MD  nitroGLYCERIN (NITROSTAT) 0.4 MG SL tablet Place 0.4 mg under the tongue every 5 (five) minutes as needed for chest pain.   Yes Historical Provider, MD  potassium chloride (MICRO-K) 10 MEQ CR capsule Take 10 mEq by mouth daily.     Yes Historical Provider, MD  ranitidine (ZANTAC) 150 MG tablet Take 150  mg by mouth 2 (two) times daily.     Yes Historical Provider, MD  topiramate (TOPAMAX) 100 MG tablet Take 100 mg by mouth 2 (two) times daily. Patient states she also take a 200mg  tablet   Yes Historical Provider, MD  topiramate (TOPAMAX) 200 MG tablet Take 200 mg by mouth 2 (two) times daily.     Yes Historical Provider, MD   Physical Exam: Filed Vitals:   12/28/12 1148  12/28/12 1255 12/28/12 1400 12/28/12 1545  BP: 158/90 125/75  124/57  Pulse: 92 94 87 79  Temp: 98 F (36.7 C)   98.1 F (36.7 C)  TempSrc: Oral   Oral  Resp: 26 21 20 18   SpO2: 100% 100% 100% 99%     General: Alert, in mild distress rating substernal chest pain at 5/10 after having received 2 sublingual nitroglycerin, and having a nitroglycerin patch     Cardiovascular: Regular rhythm and rate, negative rubs murmurs or gallops,  Respiratory: Clear to auscultation bilaterally  Abdomen: Obese, nontender nondistended plus bowel sounds  Skin:   Musculoskeletal: Negative pedal edema  Labs on Admission:  Basic Metabolic Panel:  Recent Labs Lab 12/28/12 1155  NA 141  K 3.4*  CL 101  CO2 29  GLUCOSE 144*  BUN 18  CREATININE 1.00  CALCIUM 10.1   Liver Function Tests:  Recent Labs Lab 12/28/12 1155  AST 15  ALT 12  ALKPHOS 80  BILITOT 0.3  PROT 7.4  ALBUMIN 3.6   No results found for this basename: LIPASE, AMYLASE,  in the last 168 hours No results found for this basename: AMMONIA,  in the last 168 hours CBC:  Recent Labs Lab 12/28/12 1155  WBC 11.8*  NEUTROABS 8.7*  HGB 13.9  HCT 42.2  MCV 87.0  PLT 272   Cardiac Enzymes:  Recent Labs Lab 12/28/12 1155  TROPONINI <0.30    BNP (last 3 results)  Recent Labs  12/28/12 1155  PROBNP 87.5   CBG: No results found for this basename: GLUCAP,  in the last 168 hours  Radiological Exams on Admission: Dg Chest 2 View  12/28/2012   *RADIOLOGY REPORT*  Clinical Data: Chest pain and shortness of breath.  CHEST - 2 VIEW  Comparison: No priors.  Findings: Lung volumes are normal.  No consolidative airspace disease.  No pleural effusions.  No pneumothorax.  No pulmonary nodule or mass noted.  Pulmonary vasculature and the cardiomediastinal silhouette are within normal limits.  IMPRESSION: 1. No radiographic evidence of acute cardiopulmonary disease.   Original Report Authenticated By: Trudie Reed, M.D.     [id number] ST-T wave changes leads 1, aVL and V1, V2 cannot rule out anterolateral ischemia.  Assessment/Plan Active Problems:   * No active hospital problems. *   1.  Atypical chest pain; contact a cardiologist Dr.Harwani who has agreed to see patient will start patient on heparin patient already had one aspirin. Will also administer morphine 2 mg for pain now 2. Seizure; will allow patient to take her daily seizure medication with sips of water 3. HTN; currently BP is controlled 4. DM; patient is n.p.o.untill cleared by cardiology we'll place patient on SS I for blood sugar control. If patient not taken to cath will restart her normal home medication   Code Status: Full Disposition Plan: Per cardiology  Time spent:  Drema [place] [facility] Pager [phone]  If 7PM-7AM, please contact night-coverage [email or web address] Password [facility] 12/28/2012, 4:03 PM

## 2012-12-28 NOTE — ED Notes (Signed)
Pt c/o chest pain x 3 days with shortness of breath. Pt seen at Dr. Julio Sicks office and sent here for further eval. Pt given Aspirin at PCP office.

## 2012-12-29 ENCOUNTER — Inpatient Hospital Stay (HOSPITAL_COMMUNITY): Payer: Medicare HMO

## 2012-12-29 DIAGNOSIS — E119 Type 2 diabetes mellitus without complications: Secondary | ICD-10-CM

## 2012-12-29 DIAGNOSIS — I10 Essential (primary) hypertension: Secondary | ICD-10-CM

## 2012-12-29 DIAGNOSIS — R079 Chest pain, unspecified: Secondary | ICD-10-CM

## 2012-12-29 LAB — GLUCOSE, CAPILLARY
Glucose-Capillary: 188 mg/dL — ABNORMAL HIGH (ref 70–99)
Glucose-Capillary: 143 mg/dL — ABNORMAL HIGH (ref 70–99)
Glucose-Capillary: 213 mg/dL — ABNORMAL HIGH (ref 70–99)

## 2012-12-29 LAB — CBC
HCT: 39.1 % (ref 36.0–46.0)
MCHC: 33.2 g/dL (ref 30.0–36.0)
Platelets: 238 10*3/uL (ref 150–400)
RDW: 14.9 % (ref 11.5–15.5)
WBC: 11.1 10*3/uL — ABNORMAL HIGH (ref 4.0–10.5)

## 2012-12-29 LAB — HEPARIN LEVEL (UNFRACTIONATED)
Heparin Unfractionated: 1.46 IU/mL — ABNORMAL HIGH (ref 0.30–0.70)
Heparin Unfractionated: 1.32 IU/mL — ABNORMAL HIGH (ref 0.30–0.70)
Heparin Unfractionated: 1 IU/mL — ABNORMAL HIGH (ref 0.30–0.70)

## 2012-12-29 MED ORDER — TECHNETIUM TC 99M SESTAMIBI GENERIC - CARDIOLITE
30.0000 | Freq: Once | INTRAVENOUS | Status: AC | PRN
  Administered 2012-12-29: 30 via INTRAVENOUS

## 2012-12-29 MED ORDER — HEPARIN (PORCINE) IN NACL 100-0.45 UNIT/ML-% IJ SOLN
1100.0000 [IU]/h | INTRAMUSCULAR | Status: DC
  Filled 2012-12-29 (×2): qty 250

## 2012-12-29 MED ORDER — MAGNESIUM HYDROXIDE 400 MG/5ML PO SUSP
30.0000 mL | Freq: Every day | ORAL | Status: DC | PRN
  Administered 2012-12-30: 30 mL via ORAL
  Filled 2012-12-29: qty 30

## 2012-12-29 MED ORDER — HEPARIN (PORCINE) IN NACL 100-0.45 UNIT/ML-% IJ SOLN
950.0000 [IU]/h | INTRAMUSCULAR | Status: DC
  Administered 2012-12-29: 950 [IU]/h via INTRAVENOUS
  Filled 2012-12-29: qty 250

## 2012-12-29 NOTE — Progress Notes (Addendum)
ANTICOAGULATION CONSULT NOTE - Follow Up Consult  Pharmacy Consult for heparin Indication: chest pain/ACS  No Known Allergies  Patient Measurements: Weight: 242 lb 12.8 oz (110.133 kg) Heparin Dosing Weight: 74.9 kg  Vital Signs: Temp: 97.8 F (36.6 C) (07/22 0500) Temp src: Oral (07/22 0500) BP: 119/72 mmHg (07/22 1309) Pulse Rate: 60 (07/22 0500)  Labs:  Recent Labs  12/28/12 1155 12/28/12 1622 12/28/12 2109 12/29/12 0115 12/29/12 0125  HGB 13.9 13.4  --   --   --   HCT 42.2 39.9  --   --   --   PLT 272 291  --   --   --   LABPROT  --  12.8  --   --   --   INR  --  0.98  --   --   --   HEPARINUNFRC  --   --   --  1.46*  --   CREATININE 1.00 0.99  --   --   --   TROPONINI <0.30 <0.30 <0.30  --  <0.30    The CrCl is unknown because both a height and weight (above a minimum accepted value) are required for this calculation.   Medications:  Scheduled:  . aspirin  324 mg Oral NOW   Or  . aspirin  300 mg Rectal NOW  . aspirin EC  81 mg Oral Daily  . atorvastatin  80 mg Oral q1800  . dorzolamide  1 drop Both Eyes TID  . famotidine  20 mg Oral BID  . gabapentin  300 mg Oral TID  . insulin aspart  0-15 Units Subcutaneous Q4H  . lisinopril  20 mg Oral Daily  . metoprolol tartrate  12.5 mg Oral BID  . nitroGLYCERIN  1 inch Topical Q6H  . potassium chloride  10 mEq Oral BID  . topiramate  300 mg Oral BID   Infusions:  . heparin 1,100 Units/hr (12/29/12 0449)    Assessment: 67 yo female with ACS is currently on supratherapeutic heparin.  Heparin level was 1.32.  CBC got retimed to 1800.  Verified heparin rate with RN. Goal of Therapy:  Heparin level 0.3-0.7 units/ml Monitor platelets by anticoagulation protocol: Yes   Plan:  1) Hold heparin x1 hour, then reduce heparin drip to 950 units/hr. 2) 6hr heparin level and CBC  [name] [name], Tsz-Yin 12/29/2012,1:39 PM

## 2012-12-29 NOTE — Progress Notes (Signed)
Subjective:  Patient complained of retrosternal sharp chest pain radiating to left arm relieved with sublingual nitroglycerin earlier this a.m. also states chest pain increases with deep breathing. Denies any shortness of breath. Patient went for resting nuclear scan today schedule for stress portion tomorrow  Objective:  Vital Signs in the last 24 hours: Temp:  [97.7 F (36.5 C)-98.1 F (36.7 C)] 97.8 F (36.6 C) (07/22 0500) Pulse Rate:  [60-94] 60 (07/22 0500) Resp:  [18-21] 18 (07/22 0500) BP: (101-148)/(55-75) 148/73 mmHg (07/22 0944) SpO2:  [99 %-100 %] 100 % (07/22 0500) Weight:  [110.133 kg (242 lb 12.8 oz)] 110.133 kg (242 lb 12.8 oz) (07/22 0944)  Intake/Output from previous day:   Intake/Output from this shift:    Physical Exam: Neck: no adenopathy, no carotid bruit, no JVD and supple, symmetrical, trachea midline Lungs: clear to auscultation bilaterally Heart: regular rate and rhythm, S1, S2 normal, no murmur, click, rub or gallop Abdomen: soft, non-tender; bowel sounds normal; no masses,  no organomegaly Extremities: extremities normal, atraumatic, no cyanosis or edema  Lab Results:  Recent Labs  12/28/12 1155 12/28/12 1622  WBC 11.8* 11.3*  HGB 13.9 13.4  PLT 272 291    Recent Labs  12/28/12 1155 12/28/12 1622  NA 141 141  K 3.4* 3.7  CL 101 101  CO2 29 32  GLUCOSE 144* 149*  BUN 18 18  CREATININE 1.00 0.99    Recent Labs  12/28/12 2109 12/29/12 0125  TROPONINI <0.30 <0.30   Hepatic Function Panel  Recent Labs  12/28/12 1622  PROT 6.9  ALBUMIN 3.3*  AST 15  ALT 11  ALKPHOS 74  BILITOT 0.2*    Recent Labs  12/28/12 1623  CHOL 157   No results found for this basename: PROTIME,  in the last 72 hours  Imaging: Imaging results have been reviewed and Dg Chest 2 View  12/28/2012   *RADIOLOGY REPORT*  Clinical Data: Chest pain and shortness of breath.  CHEST - 2 VIEW  Comparison: No priors.  Findings: Lung volumes are normal.  No  consolidative airspace disease.  No pleural effusions.  No pneumothorax.  No pulmonary nodule or mass noted.  Pulmonary vasculature and the cardiomediastinal silhouette are within normal limits.  IMPRESSION: 1. No radiographic evidence of acute cardiopulmonary disease.   Original Report Authenticated By: Trudie Reed, M.D.    Cardiac Studies:  Assessment/Plan:  Status post Atypical chest pain MI ruled out Hypertension  Diabetes mellitus  Obstructive sleep apnea  Morbid Obesity  History of seizure disorder  Positive family history of coronary artery disease Plan Continue present management Schedule for stress portion of nuclear stress test tomorrow  LOS: 1 day    Diamonte Stavely N 12/29/2012, 12:23 PM

## 2012-12-29 NOTE — Progress Notes (Signed)
[facility] PROGRESS NOTE  [name] [name] ZOX:[id number] DOB: [birth year] DOA: 12/28/2012 PCP: Jackie Plum, MD  HPI: [name] [name] is a 67 y.o. BF PMHx seizures, OSA, HTN, DM, asthma patient states has been having approximately 2 weeks of increasing waxing and waning chest pain/chest pain substernal sharp, positive radiation to the left shoulder, positive presyncope, positive diaphoresis, negative nausea, positive shortness of breath. States inciting factor to visit her PCP was the increasing severity of the pain in decreasing her time between episodes. The patient was seen by her PCP his name is Dr. Greggory Stallion [facility] patient was given 2NTG sublingual, and nitroglycerin paste without total resolution of pain. States currently her pain is a 5/10 substernal. Patient stated her last meal was at 11:30 this morning, and has not had her a.m. Medication.  Assessment/Plan:  Chest pain - cardiology following, plan for stress test in 2 days  Seizure - continue meds HTN - home meds DM - SSI  Code Status: presumed full Family Communication: none  Disposition Plan: stress test today and tomorrow  Consultants:  Cardiology  Procedures:  none  Antibiotics:  Anti-infectives   None     Antibiotics Given (last 72 hours)   None     HPI/Subjective: - feeling well this morning without complaints.   Objective: Filed Vitals:   12/28/12 1545 12/28/12 2100 12/29/12 0500 12/29/12 0944  BP: 124/57 108/62 101/55 148/73  Pulse: 79 68 60   Temp: 98.1 F (36.7 C) 97.7 F (36.5 C) 97.8 F (36.6 C)   TempSrc: Oral Oral Oral   Resp: 18 18 18    Weight:    110.133 kg (242 lb 12.8 oz)  SpO2: 99% 100% 100%    No intake or output data in the 24 hours ending 12/29/12 1308 Filed Weights   12/29/12 0944  Weight: 110.133 kg (242 lb 12.8 oz)    Exam:   General:  NAD  Cardiovascular: regular rate and rhythm, without MRG  Respiratory: good air movement, clear to auscultation throughout, no  wheezing, ronchi or rales  Abdomen: soft, not tender to palpation, positive bowel sounds  MSK: no peripheral edema  Neuro: CN 2-12 grossly intact, MS 5/5 in all 4  Data Reviewed: Basic Metabolic Panel:  Recent Labs Lab 12/28/12 1155 12/28/12 1622  NA 141 141  K 3.4* 3.7  CL 101 101  CO2 29 32  GLUCOSE 144* 149*  BUN 18 18  CREATININE 1.00 0.99  CALCIUM 10.1 9.8  MG  --  2.1   Liver Function Tests:  Recent Labs Lab 12/28/12 1155 12/28/12 1622  AST 15 15  ALT 12 11  ALKPHOS 80 74  BILITOT 0.3 0.2*  PROT 7.4 6.9  ALBUMIN 3.6 3.3*   CBC:  Recent Labs Lab 12/28/12 1155 12/28/12 1622  WBC 11.8* 11.3*  NEUTROABS 8.7* 8.2*  HGB 13.9 13.4  HCT 42.2 39.9  MCV 87.0 87.1  PLT 272 291   Cardiac Enzymes:  Recent Labs Lab 12/28/12 1155 12/28/12 1622 12/28/12 2109 12/29/12 0125  TROPONINI <0.30 <0.30 <0.30 <0.30   BNP (last 3 results)  Recent Labs  12/28/12 1155 12/28/12 1622  PROBNP 87.5 89.5   CBG:  Recent Labs Lab 12/28/12 2037 12/29/12 0012 12/29/12 0437 12/29/12 0737 12/29/12 1232  GLUCAP 117* 106* 188* 143* 111*   Studies: Dg Chest 2 View  12/28/2012   *RADIOLOGY REPORT*  Clinical Data: Chest pain and shortness of breath.  CHEST - 2 VIEW  Comparison: No priors.  Findings: Lung volumes are normal.  No consolidative airspace disease.  No pleural effusions.  No pneumothorax.  No pulmonary nodule or mass noted.  Pulmonary vasculature and the cardiomediastinal silhouette are within normal limits.  IMPRESSION: 1. No radiographic evidence of acute cardiopulmonary disease.   Original Report Authenticated By: Trudie Reed, M.D.    Scheduled Meds: . aspirin  324 mg Oral NOW   Or  . aspirin  300 mg Rectal NOW  . aspirin EC  81 mg Oral Daily  . atorvastatin  80 mg Oral q1800  . dorzolamide  1 drop Both Eyes TID  . famotidine  20 mg Oral BID  . gabapentin  300 mg Oral TID  . insulin aspart  0-15 Units Subcutaneous Q4H  . lisinopril  20 mg  Oral Daily  . metoprolol tartrate  12.5 mg Oral BID  . nitroGLYCERIN  1 inch Topical Q6H  . potassium chloride  10 mEq Oral BID  . topiramate  300 mg Oral BID   Continuous Infusions: . heparin 1,100 Units/hr (12/29/12 0449)    Active Problems:   HTN (hypertension)   OSA on CPAP   Time spent: 25  Pamella Pert, MD [facility] Pager [phone]. If 7 PM - 7 AM, please contact night-coverage at [email or web address], password [email or web address] 12/29/2012, 1:08 PM  LOS: 1 day

## 2012-12-29 NOTE — Progress Notes (Signed)
Patient refused to wear CPAP at night.  Stated that she was going home tomorrow and did not need to wear it.  RT will place on her if she changes her mind.

## 2012-12-29 NOTE — Progress Notes (Signed)
UR Completed Maryann Mccall Graves-Bigelow, RN,BSN [phone]  

## 2012-12-29 NOTE — Progress Notes (Signed)
ANTICOAGULATION CONSULT NOTE - Follow Up Consult  Pharmacy Consult for heparin Indication: chest pain/ACS  Labs:  Recent Labs  12/28/12 1155 12/28/12 1622 12/28/12 2109 12/29/12 0115 12/29/12 0125  HGB 13.9 13.4  --   --   --   HCT 42.2 39.9  --   --   --   PLT 272 291  --   --   --   LABPROT  --  12.8  --   --   --   INR  --  0.98  --   --   --   HEPARINUNFRC  --   --   --  1.46*  --   CREATININE 1.00 0.99  --   --   --   TROPONINI <0.30 <0.30 <0.30  --  <0.30    Assessment: 67yo female supratherapeutic on heparin with initial dosing for CP; lab drawn correctly but a little early.  Goal of Therapy:  Heparin level 0.3-0.7 units/ml   Plan:  Will hold heparin gtt x56min then decrease heparin gtt to 1100 units/hr and check level in 6hr.  Vernard Gambles, PharmD, BCPS  12/29/2012,3:14 AM

## 2012-12-29 NOTE — Progress Notes (Signed)
Nutrition Brief Note  Patient identified on the Malnutrition Screening Tool (MST) Report. Per family at bedside, pt's intake has been stable. Family reports weight loss, however per chart review, pt weighed 240 lb in January and per ED records, pt weighed 243 lb when admitted.  Family notes that pt watches her portions and does not take any oral nutrition supplements at baseline.  Body mass index is 45.9 kg/(m^2). Patient meets criteria for Obese Class III based on current BMI.   Current diet order is NPO. Labs and medications reviewed.   No nutrition interventions warranted at this time. If nutrition issues arise, please consult RD.   Jarold Motto MS, RD, LDN Pager: [phone] After-hours pager: [phone]

## 2012-12-29 NOTE — Care Management Note (Addendum)
  Page 1 of 1   12/29/2012     11:55:15 AM   CARE MANAGEMENT NOTE 12/29/2012  Patient:  [name],[name]   Account Number:  [id number]  Date Initiated:  12/29/2012  Documentation initiated by:  [facility]  Subjective/Objective Assessment:   pt admitted w/CP; Plan for 2 day stress test; diposition plan for home when stable; lives w/husband and daughter; uses [facility] in [place]     Action/Plan:   CM recieved referral for med asst.  pt having difficulty affording insulin and Trusopt; CM called [employer] rep and [employer] has a Right [employer] mail order program at little to no cost; MD to sign forms.   Anticipated DC Date:  12/31/2012   Anticipated DC Plan:  HOME/SELF CARE      DC Planning Services  CM consult  Medication Assistance      Choice offered to / List presented to:             Status of service:  Completed, signed off Medicare Important Message given?   (If response is "NO", the following Medicare IM given date fields will be blank) Date Medicare IM given:   Date Additional Medicare IM given:    Discharge Disposition:  HOME/SELF CARE  Per UR Regulation:  Reviewed for med. necessity/level of care/duration of stay  If discussed at Long Length of Stay Meetings, dates discussed:    Comments:  Pt will be eligible for Senior Bridge program through [place]; brochure about [employer] will be given to pt by [employer].

## 2012-12-30 ENCOUNTER — Inpatient Hospital Stay (HOSPITAL_COMMUNITY): Payer: Medicare HMO

## 2012-12-30 LAB — CBC
MCH: 29 pg (ref 26.0–34.0)
MCV: 87.2 fL (ref 78.0–100.0)
Platelets: 318 10*3/uL (ref 150–400)
RBC: 4.45 MIL/uL (ref 3.87–5.11)
RDW: 15 % (ref 11.5–15.5)
WBC: 13.4 10*3/uL — ABNORMAL HIGH (ref 4.0–10.5)

## 2012-12-30 LAB — GLUCOSE, CAPILLARY
Glucose-Capillary: 185 mg/dL — ABNORMAL HIGH (ref 70–99)
Glucose-Capillary: 151 mg/dL — ABNORMAL HIGH (ref 70–99)

## 2012-12-30 LAB — HEPARIN LEVEL (UNFRACTIONATED): Heparin Unfractionated: 0.32 IU/mL (ref 0.30–0.70)

## 2012-12-30 MED ORDER — REGADENOSON 0.4 MG/5ML IV SOLN
INTRAVENOUS | Status: AC
  Administered 2012-12-30: 0.4 mg via INTRAVENOUS
  Filled 2012-12-30: qty 5

## 2012-12-30 MED ORDER — REGADENOSON 0.4 MG/5ML IV SOLN: 0.4000 mg | Freq: Once | INTRAVENOUS | Status: AC

## 2012-12-30 MED ORDER — HEPARIN (PORCINE) IN NACL 100-0.45 UNIT/ML-% IJ SOLN
700.0000 [IU]/h | INTRAMUSCULAR | Status: DC
  Filled 2012-12-30: qty 250

## 2012-12-30 MED ORDER — HEPARIN (PORCINE) IN NACL 100-0.45 UNIT/ML-% IJ SOLN
800.0000 [IU]/h | INTRAMUSCULAR | Status: DC
  Filled 2012-12-30: qty 250

## 2012-12-30 MED ORDER — TECHNETIUM TC 99M SESTAMIBI GENERIC - CARDIOLITE
30.0000 | Freq: Once | INTRAVENOUS | Status: AC | PRN
  Administered 2012-12-30: 30 via INTRAVENOUS

## 2012-12-30 MED ORDER — VITAMIN D3 25 MCG (1000 UNIT) PO TABS
1000.0000 [IU] | ORAL_TABLET | Freq: Every day | ORAL | Status: DC
  Administered 2012-12-30 – 2012-12-31 (×2): 1000 [IU] via ORAL
  Filled 2012-12-30 (×2): qty 1

## 2012-12-30 NOTE — Progress Notes (Signed)
Chaplain Note: pt sitting in bed watching tv. Chaplain provided spiritual support and encouragement and prayed with pt.  Pt expressed a strong faith in [employer] and participated in the prayer. Pt grateful for the visit and prayer.  Will follow up as needed.  Rutherford Nail Chaplain [phone]

## 2012-12-30 NOTE — Progress Notes (Signed)
Subjective:  Patient continues to have recurrent vague pleuritic chest pain off and on overall feels better. No shortness of breath.  Objective:  Vital Signs in the last 24 hours: Temp:  [97.4 F (36.3 C)-98.5 F (36.9 C)] 98.5 F (36.9 C) (07/23 0600) Pulse Rate:  [61-84] 84 (07/23 0600) Resp:  [18] 18 (07/22 1752) BP: (93-134)/(39-82) 134/82 mmHg (07/23 1142) SpO2:  [100 %] 100 % (07/23 0600) Weight:  [111.131 kg (245 lb)] 111.131 kg (245 lb) (07/23 0600)  Intake/Output from previous day: 07/22 0701 - 07/23 0700 In: 360 [P.O.:360] Out: -  Intake/Output from this shift:    Physical Exam: Neck: no adenopathy, no carotid bruit, no JVD and supple, symmetrical, trachea midline Lungs: clear to auscultation bilaterally Heart: regular rate and rhythm, S1, S2 normal, no murmur, click, rub or gallop Abdomen: soft, non-tender; bowel sounds normal; no masses,  no organomegaly Extremities: extremities normal, atraumatic, no cyanosis or edema  Lab Results:  Recent Labs  12/28/12 1622 12/29/12 2300  WBC 11.3* 11.1*  HGB 13.4 13.0  PLT 291 238    Recent Labs  12/28/12 1155 12/28/12 1622  NA 141 141  K 3.4* 3.7  CL 101 101  CO2 29 32  GLUCOSE 144* 149*  BUN 18 18  CREATININE 1.00 0.99    Recent Labs  12/28/12 2109 12/29/12 0125  TROPONINI <0.30 <0.30   Hepatic Function Panel  Recent Labs  12/28/12 1622  PROT 6.9  ALBUMIN 3.3*  AST 15  ALT 11  ALKPHOS 74  BILITOT 0.2*    Recent Labs  12/28/12 1623  CHOL 157   No results found for this basename: PROTIME,  in the last 72 hours  Imaging: Imaging results have been reviewed and No results found.  Cardiac Studies:  Assessment/Plan:  Status post Atypical chest pain MI ruled out  Hypertension  Diabetes mellitus  Obstructive sleep apnea  Morbid Obesity  History of seizure disorder  Positive family history of coronary artery disease  Plan Continue present management Check nuclear stress test results  if negative for ischemia okay to discharge from cardiac point of view  LOS: 2 days    [name] [name] N 12/30/2012, 12:51 PM

## 2012-12-30 NOTE — Progress Notes (Signed)
ANTICOAGULATION CONSULT NOTE - Follow Up Consult  Pharmacy Consult:  Heparin Indication:  ACS  No Known Allergies  Patient Measurements: Height: 5' 1.02" (155 cm) Weight: 245 lb (111.131 kg) IBW/kg (Calculated) : 47.86 Heparin Dosing Weight: 75 kg  Vital Signs: Temp: 98.5 F (36.9 C) (07/23 0600) Temp src: Oral (07/23 0600) BP: 134/82 mmHg (07/23 1142) Pulse Rate: 84 (07/23 0600)  Labs:  Recent Labs  12/28/12 1155 12/28/12 1622 12/28/12 2109 12/29/12 0115 12/29/12 0125 12/29/12 1319 12/29/12 2300 12/30/12 1225  HGB 13.9 13.4  --   --   --   --  13.0 12.9  HCT 42.2 39.9  --   --   --   --  39.1 38.8  PLT 272 291  --   --   --   --  238 318  LABPROT  --  12.8  --   --   --   --   --   --   INR  --  0.98  --   --   --   --   --   --   HEPARINUNFRC  --   --   --  1.46*  --  1.32* 1.00*  --   CREATININE 1.00 0.99  --   --   --   --   --   --   TROPONINI <0.30 <0.30 <0.30  --  <0.30  --   --   --     Estimated Creatinine Clearance: 65.5 ml/min (by C-G formula based on Cr of 0.99).      Assessment: 46 YOF admitted with three day history of intermittent chest pain and shortness of breath and started on IV heparin.  S/p stress test today 12/30/12 - results pending.  Heparin level slightly sub-therapeutic post rate decrease; no bleeding reported.   Goal of Therapy:  Heparin level 0.3-0.7 units/ml Monitor platelets by anticoagulation protocol: Yes    Plan:  - Increase heparin gtt to 800 units/hr - Check 6 hr HL - Daily HL / CBC - F/U stress test result and AC plans     Jniyah Dantuono D. Laney Potash, PharmD, BCPS Pager:  [phone] 12/30/2012, 2:44 PM

## 2012-12-30 NOTE — Progress Notes (Signed)
ANTICOAGULATION CONSULT NOTE - Follow Up Consult  Pharmacy Consult for heparin Indication: chest pain/ACS  No Known Allergies  Patient Measurements: Weight: 242 lb 12.8 oz (110.133 kg) Heparin Dosing Weight: 74.9 kg  Vital Signs: Temp: 97.6 F (36.4 C) (07/22 2150) Temp src: Oral (07/22 2150) BP: 127/66 mmHg (07/22 2150) Pulse Rate: 72 (07/22 2150)  Labs:  Recent Labs  12/28/12 1155 12/28/12 1622 12/28/12 2109 12/29/12 0115 12/29/12 0125 12/29/12 1319 12/29/12 2300  HGB 13.9 13.4  --   --   --   --  13.0  HCT 42.2 39.9  --   --   --   --  39.1  PLT 272 291  --   --   --   --  238  LABPROT  --  12.8  --   --   --   --   --   INR  --  0.98  --   --   --   --   --   HEPARINUNFRC  --   --   --  1.46*  --  1.32* 1.00*  CREATININE 1.00 0.99  --   --   --   --   --   TROPONINI <0.30 <0.30 <0.30  --  <0.30  --   --    Medications:  Scheduled:  . aspirin EC  81 mg Oral Daily  . atorvastatin  80 mg Oral q1800  . dorzolamide  1 drop Both Eyes TID  . famotidine  20 mg Oral BID  . gabapentin  300 mg Oral TID  . insulin aspart  0-15 Units Subcutaneous Q4H  . lisinopril  20 mg Oral Daily  . metoprolol tartrate  12.5 mg Oral BID  . nitroGLYCERIN  1 inch Topical Q6H  . potassium chloride  10 mEq Oral BID  . topiramate  300 mg Oral BID   Infusions:  . heparin     Assessment: 67 yo female with ACS and started on IV heparin.  She remains above desired goal range despite holding therapy for 1 hour and resuming at reduced rate.  She is without noted bleeding and her CBC is WNL.  I spoke with the nurse who confirms that there have been no issues regarding her heparin therapy.  She has no noted bleeding either.  We will hold her heparin again and reduce the rate.   This plan of care has been reviewed with her nurse.  Goal of Therapy:  Heparin level 0.3-0.7 units/ml Monitor platelets by anticoagulation protocol: Yes   Plan:  1) Hold heparin x1 hour, then reduce heparin drip to  700 units/hr. 2) 8hr heparin level   Nadara Mustard, PharmD., MS Clinical Pharmacist Pager:  [phone] Thank you for allowing pharmacy to be part of this patients care team. 12/30/2012,12:04 AM

## 2012-12-30 NOTE — Progress Notes (Signed)
ANTICOAGULATION CONSULT NOTE - Follow Up Consult  Pharmacy Consult:  Heparin Indication:  ACS  No Known Allergies  Patient Measurements: Height: 5' 1.02" (155 cm) Weight: 245 lb (111.131 kg) IBW/kg (Calculated) : 47.86 Heparin Dosing Weight: 75 kg  Vital Signs: Temp: 97.9 F (36.6 C) (07/23 1953) Temp src: Oral (07/23 1953) BP: 112/71 mmHg (07/23 2133) Pulse Rate: 80 (07/23 2133)  Labs:  Recent Labs  12/28/12 1155 12/28/12 1622 12/28/12 2109  12/29/12 0125  12/29/12 2300 12/30/12 1225 12/30/12 1413 12/30/12 2301  HGB 13.9 13.4  --   --   --   --  13.0 12.9  --   --   HCT 42.2 39.9  --   --   --   --  39.1 38.8  --   --   PLT 272 291  --   --   --   --  238 318  --   --   LABPROT  --  12.8  --   --   --   --   --   --   --   --   INR  --  0.98  --   --   --   --   --   --   --   --   HEPARINUNFRC  --   --   --   < >  --   < > 1.00*  --  0.23* 0.32  CREATININE 1.00 0.99  --   --   --   --   --   --   --   --   TROPONINI <0.30 <0.30 <0.30  --  <0.30  --   --   --   --   --   < > = values in this interval not displayed.  Estimated Creatinine Clearance: 65.5 ml/min (by C-G formula based on Cr of 0.99).  Assessment: 82 YOF admitted with three day history of intermittent chest pain and shortness of breath and started on IV heparin.  Heparin level now within desired goal range with rate increase; no bleeding reported.  Goal of Therapy:  Heparin level 0.3-0.7 units/ml Monitor platelets by anticoagulation protocol: Yes   Plan:  -   Will continue her IV heparin gtt at 800 units/hr -   Check daily HL / CBC -   F/U stress test result and [facility] plans  Nadara Mustard, PharmD., MS Clinical Pharmacist Pager:  [phone] Thank you for allowing pharmacy to be part of this patients care team. 12/30/2012, 11:51 PM

## 2012-12-30 NOTE — Progress Notes (Signed)
[facility] PROGRESS NOTE  Sacha Topor UJW:[id number] DOB: 07-18-1945 DOA: 12/28/2012 PCP: Jackie Plum, MD  HPI: [name] [name] is a 67 y.o. BF PMHx seizures, OSA, HTN, DM, asthma patient states has been having approximately 2 weeks of increasing waxing and waning chest pain/chest pain substernal sharp, positive radiation to the left shoulder, positive presyncope, positive diaphoresis, negative nausea, positive shortness of breath. States inciting factor to visit her PCP was the increasing severity of the pain in decreasing her time between episodes. The patient was seen by her PCP his name is Dr. Greggory Stallion [facility] patient was given 2NTG sublingual, and nitroglycerin paste without total resolution of pain. States currently her pain is a 5/10 substernal. Patient stated her last meal was at 11:30 this morning, and has not had her a.m. Medication.  Assessment/Plan:  Chest pain  - cardiology following, stress test to be completed today, follow  Seizure d/o  - continue outpt meds, remains sz free HTN  - home meds DM  - SSI  Code Status: presumed full Family Communication: none  Disposition Plan: stress test today and tomorrow  Consultants:  Cardiology  Procedures:  none  Antibiotics:  Anti-infectives   None     Antibiotics Given (last 72 hours)   None     HPI/Subjective: - denies CP, s/p stress test. No sz reported   Objective: Filed Vitals:   12/30/12 1050 12/30/12 1142 12/30/12 1300 12/30/12 1420  BP: 121/55 134/82  131/57  Pulse:    76  Temp:    98.4 F (36.9 C)  TempSrc:    Oral  Resp:    18  Height:   5' 1.02" (1.55 m)   Weight:      SpO2:    100%    Intake/Output Summary (Last 24 hours) at 12/30/12 1550 Last data filed at 12/30/12 1500  Gross per 24 hour  Intake    720 ml  Output      0 ml  Net    720 ml   Filed Weights   12/29/12 0944 12/30/12 0600  Weight: 110.133 kg (242 lb 12.8 oz) 111.131 kg (245 lb)    Exam:   General:  NAD,  Obese  Cardiovascular: regular rate and rhythm, without MRG  Respiratory: good air movement, clear to auscultation throughout, no wheezing, ronchi or rales  Abdomen: soft, not tender to palpation, positive bowel sounds  MSK: no peripheral edema  Neuro: CN 2-12 grossly intact, MS 5/5 in all 4  Data Reviewed: Basic Metabolic Panel:  Recent Labs Lab 12/28/12 1155 12/28/12 1622  NA 141 141  K 3.4* 3.7  CL 101 101  CO2 29 32  GLUCOSE 144* 149*  BUN 18 18  CREATININE 1.00 0.99  CALCIUM 10.1 9.8  MG  --  2.1   Liver Function Tests:  Recent Labs Lab 12/28/12 1155 12/28/12 1622  AST 15 15  ALT 12 11  ALKPHOS 80 74  BILITOT 0.3 0.2*  PROT 7.4 6.9  ALBUMIN 3.6 3.3*   CBC:  Recent Labs Lab 12/28/12 1155 12/28/12 1622 12/29/12 2300 12/30/12 1225  WBC 11.8* 11.3* 11.1* 13.4*  NEUTROABS 8.7* 8.2*  --   --   HGB 13.9 13.4 13.0 12.9  HCT 42.2 39.9 39.1 38.8  MCV 87.0 87.1 88.1 87.2  PLT 272 291 238 318   Cardiac Enzymes:  Recent Labs Lab 12/28/12 1155 12/28/12 1622 12/28/12 2109 12/29/12 0125  TROPONINI <0.30 <0.30 <0.30 <0.30   BNP (last 3 results)  Recent Labs  12/28/12 1155 12/28/12 1622  PROBNP 87.5 89.5   CBG:  Recent Labs Lab 12/29/12 1630 12/29/12 2203 12/30/12 0408 12/30/12 0723 12/30/12 1141  GLUCAP 213* 147* 185* 179* 151*   Studies: No results found.  Scheduled Meds: . aspirin EC  81 mg Oral Daily  . atorvastatin  80 mg Oral q1800  . cholecalciferol  1,000 Units Oral Daily  . dorzolamide  1 drop Both Eyes TID  . famotidine  20 mg Oral BID  . gabapentin  300 mg Oral TID  . insulin aspart  0-15 Units Subcutaneous Q4H  . lisinopril  20 mg Oral Daily  . metoprolol tartrate  12.5 mg Oral BID  . nitroGLYCERIN  1 inch Topical Q6H  . potassium chloride  10 mEq Oral BID  . topiramate  300 mg Oral BID   Continuous Infusions: . heparin      Active Problems:   HTN (hypertension)   OSA on CPAP   Time spent: 25  Donnalee Curry, MD [facility] Pager [phone]. If 7 PM - 7 AM, please contact night-coverage at [email or web address], password [facility] 12/30/2012, 3:50 PM  LOS: 2 days

## 2012-12-31 DIAGNOSIS — G4733 Obstructive sleep apnea (adult) (pediatric): Secondary | ICD-10-CM

## 2012-12-31 LAB — BASIC METABOLIC PANEL
BUN: 20 mg/dL (ref 6–23)
Calcium: 9 mg/dL (ref 8.4–10.5)
Creatinine, Ser: 0.91 mg/dL (ref 0.50–1.10)
GFR calc Af Amer: 75 mL/min — ABNORMAL LOW (ref >90)
GFR calc non Af Amer: 65 mL/min — ABNORMAL LOW (ref >90)
Potassium: 4.2 mEq/L (ref 3.5–5.1)

## 2012-12-31 LAB — CBC
MCH: 28.3 pg (ref 26.0–34.0)
MCHC: 31.9 g/dL (ref 30.0–36.0)
MCV: 88.9 fL (ref 78.0–100.0)
Platelets: 226 10*3/uL (ref 150–400)
RDW: 14.9 % (ref 11.5–15.5)
WBC: 8.2 10*3/uL (ref 4.0–10.5)

## 2012-12-31 LAB — GLUCOSE, CAPILLARY
Glucose-Capillary: 139 mg/dL — ABNORMAL HIGH (ref 70–99)
Glucose-Capillary: 213 mg/dL — ABNORMAL HIGH (ref 70–99)
Glucose-Capillary: 166 mg/dL — ABNORMAL HIGH (ref 70–99)

## 2012-12-31 MED ORDER — VITAMIN D3 25 MCG (1000 UNIT) PO TABS: 1000.0000 [IU] | ORAL_TABLET | Freq: Every day | ORAL | Status: AC

## 2012-12-31 MED ORDER — METOPROLOL TARTRATE 12.5 MG HALF TABLET: 12.5000 mg | ORAL_TABLET | Freq: Two times a day (BID) | ORAL | Status: DC

## 2012-12-31 MED ORDER — ATORVASTATIN CALCIUM 80 MG PO TABS: 80.0000 mg | ORAL_TABLET | Freq: Every day | ORAL | Status: DC

## 2012-12-31 MED ORDER — INSULIN GLARGINE 100 UNIT/ML ~~LOC~~ SOLN: 30.0000 [IU] | Freq: Every day | SUBCUTANEOUS | Status: DC

## 2012-12-31 NOTE — Progress Notes (Signed)
Subjective:  Patient denies any chest pain or shortness of breath. Final nuclear stress test result still pending  Objective:  Vital Signs in the last 24 hours: Temp:  [97.6 F (36.4 C)-98.4 F (36.9 C)] 97.6 F (36.4 C) (07/24 0412) Pulse Rate:  [66-80] 66 (07/24 0412) Resp:  [18] 18 (07/24 0412) BP: (105-134)/(57-82) 105/61 mmHg (07/24 0412) SpO2:  [100 %] 100 % (07/24 0412)  Intake/Output from previous day: 07/23 0701 - 07/24 0700 In: 360 [P.O.:360] Out: -  Intake/Output from this shift:    Physical Exam: Neck: no adenopathy, no carotid bruit, no JVD and supple, symmetrical, trachea midline Lungs: clear to auscultation bilaterally Heart: regular rate and rhythm, S1, S2 normal, no murmur, click, rub or gallop Abdomen: soft, non-tender; bowel sounds normal; no masses,  no organomegaly Extremities: extremities normal, atraumatic, no cyanosis or edema  Lab Results:  Recent Labs  12/30/12 1225 12/31/12 0454  WBC 13.4* 8.2  HGB 12.9 11.7*  PLT 318 226    Recent Labs  12/28/12 1622 12/31/12 0454  NA 141 138  K 3.7 4.2  CL 101 107  CO2 32 26  GLUCOSE 149* 195*  BUN 18 20  CREATININE 0.99 0.91    Recent Labs  12/28/12 2109 12/29/12 0125  TROPONINI <0.30 <0.30   Hepatic Function Panel  Recent Labs  12/28/12 1622  PROT 6.9  ALBUMIN 3.3*  AST 15  ALT 11  ALKPHOS 74  BILITOT 0.2*    Recent Labs  12/28/12 1623  CHOL 157   No results found for this basename: PROTIME,  in the last 72 hours  Imaging: Imaging results have been reviewed and Nm Myocar Single W/spect W/wall Motion And Ef  12/30/2012   *RADIOLOGY REPORT*  Clinical Data:  Chest pain.  History of diabetes and hypertension.  MYOCARDIAL IMAGING WITH SPECT (PHARMACOLOGIC-STRESS) GATED LEFT VENTRICULAR WALL MOTION STUDY LEFT VENTRICULAR EJECTION FRACTION  Technique: Rest examination was performed yesterday. Unfortunately, due to camera malfunction, the rest portion of the study was lost.  Today,  intravenous infusion of Lexiscan was performed under the supervision of the Cardiology staff.  At peak effect of the drug, 30 mCi Tc-31m  was injected intravenously and standard myocardial SPECT imaging was performed.  Quantitative gated imaging was also performed to evaluate left ventricular wall motion, and estimate left ventricular ejection fraction.  Comparison:  Chest radiographs 12/28/2012.  Chest CT 07/18/2010.  Findings:  The stress only SPECT images from this examination demonstrate moderately decreased activity throughout the anterior wall.  The left ventricular activity is otherwise normal.  Gated images were reviewed and demonstrate normal left ventricular wall motion and systolic thickening.  Specifically, the anterior wall moves normally. The QGS ejection fraction measured at rest is 78% with an end diastolic volume of 46 ml and an end-systolic volume of 10 ml.  IMPRESSION:  1.  This stress only examination demonstrates probable breast attenuation of the anterior wall. Without a rest examination, a reversible perfusion defect cannot be completely excluded. 2.  Normal ventricular wall motion with calculated ejection fraction of 78%.  As noted above, the rest images obtained yesterday were lost due to camera malfunction. The patient may be returned for a repeat rest exam if clinically warranted.   Original Report Authenticated By: Carey Bullocks, M.D.    Cardiac Studies:  Assessment/Plan:  Status post Atypical chest pain MI ruled out  Hypertension  Diabetes mellitus  Obstructive sleep apnea  Morbid Obesity  History of seizure disorder  Positive family history  of coronary artery disease  Plan  Continue present management  Check nuclear stress test results if negative for ischemia okay to discharge from cardiac point of view   LOS: 3 days    Geryl Dohn N 12/31/2012, 11:24 AM

## 2012-12-31 NOTE — Discharge Summary (Signed)
Physician Discharge Summary  [name] [name] ZOX:[id number] DOB: [birth year] DOA: 12/28/2012  PCP: Jackie Plum, MD  Admit date: 12/28/2012 Discharge date: 12/31/2012  Time spent: >30 minutes  Recommendations for Outpatient Follow-up:      Follow-up Information   Follow up with OSEI-BONSU,GEORGE, MD. (in 1week, cll for appt upon discharge, take log of blood sugars to appt)    Contact information:   [address], [address] [place] [address] [phone]       Follow up with Robynn Pane, MD. (in 2weeks, call for appt upon discharge)    Contact information:   [address] [place] [address] [phone]        Discharge Diagnoses:  Active Problems:   HTN (hypertension)   OSA on CPAP   Discharge Condition: improved/stable  Diet recommendation: modified carb  Filed Weights   12/29/12 0944 12/30/12 0600  Weight: 110.133 kg (242 lb 12.8 oz) 111.131 kg (245 lb)    History of present illness:  [name] [name] is a 67 y.o. BF PMHx seizures, OSA, HTN, DM, asthma patient states has been having approximately 2 weeks of increasing waxing and waning chest pain/chest pain substernal sharp, positive radiation to the left shoulder, positive presyncope, positive diaphoresis, negative nausea, positive shortness of breath. States inciting factor to visit her PCP was the increasing severity of the pain in decreasing her time between episodes. The patient was seen by her PCP his name is Dr. Greggory Stallion [facility] patient was given 2NTG sublingual, and nitroglycerin paste without total resolution of pain. States currently her pain is a 5/10 substernal. Patient stated her last meal was at 11:30 this morning, and has not had her a.m. Medication.   Hospital Course:  Chest pain  -As discussed above upon admission pt was placed on iv heparin, B blocker and NTG -Cardiology was consulted, troponins were cycled and came back neg  - cardiology ordered a 2day myoview and it was  completed 7/23 and demonstrates probable breast  attenuation of the anterior wall. Without a rest examination, a reversible perfusion defect cannot be completely excluded.  and Normal ventricular wall motion with calculated ejection fraction of 78%. It was noted that the rest images obtained 7/23 were lost due to  camera malfunction>> Dr Sharyn Lull reviewed the available images and today states ok to d/c pt home. Pt's pain resolved and she has remained CP free. The heparin was dc'ed today, and she is to continue metoprolol along with her preadmission meds and folllow up with Dr Sharyn Lull -she is also to continue her zantac for GERD upon dc Seizure d/o  - continue outpt meds, remained sz free  HTN  - continue oupt meds  DM  - she was covered with SSI while in the hospital, she is to resume lantus as directed at 30units upon d/c- keep log of sugars and foolow up with PCP in 1week for uptitration of dose as appropriate   Consultants:  Cardiology Procedures:  Stress myoview- per Dr Sharyn Lull NEG ischemia  Discharge Exam: Filed Vitals:   12/30/12 1953 12/30/12 2133 12/31/12 0412 12/31/12 1417  BP: 126/71 112/71 105/61 136/78  Pulse: 69 80 66 68  Temp: 97.9 F (36.6 C)  97.6 F (36.4 C) 98.8 F (37.1 C)  TempSrc: Oral  Oral   Resp: 18  18 18   Height:      Weight:      SpO2: 100%  100% 99%   Exam:  General: NAD, Obese  Cardiovascular: regular rate and  rhythm, without MRG  Respiratory: good air movement, clear to auscultation throughout, no wheezing, ronchi or rales  Abdomen: soft, not tender to palpation, positive bowel sounds  MSK: no peripheral edema  Neuro: CN 2-12 grossly intact, MS 5/5 in all 4      Discharge Instructions  Discharge Orders   Future Orders Complete By Expires     Diet Carb Modified  As directed     Increase activity slowly  As directed         Medication List         albuterol 108 (90 BASE) MCG/ACT inhaler  Commonly known as:  PROVENTIL HFA;VENTOLIN  HFA  Inhale 2 puffs into the lungs every 6 (six) hours as needed for wheezing.     aspirin 81 MG chewable tablet  Chew 81 mg by mouth daily.     cholecalciferol 1000 UNITS tablet  Commonly known as:  VITAMIN D  Take 1 tablet (1,000 Units total) by mouth daily.     dorzolamide 2 % ophthalmic solution  Commonly known as:  TRUSOPT  Place 1 drop into both eyes 3 (three) times daily.     gabapentin 300 MG capsule  Commonly known as:  NEURONTIN  Take 300 mg by mouth 3 (three) times daily.     hydrochlorothiazide 25 MG tablet  Commonly known as:  HYDRODIURIL  Take 25 mg by mouth daily.     insulin glargine 100 UNIT/ML injection  Commonly known as:  LANTUS  Inject 0.3 mLs (30 Units total) into the skin at bedtime.     lisinopril 20 MG tablet  Commonly known as:  PRINIVIL,ZESTRIL  Take 20 mg by mouth daily.     metoprolol tartrate 12.5 mg Tabs  Commonly known as:  LOPRESSOR  Take 0.5 tablets (12.5 mg total) by mouth 2 (two) times daily.     nitroGLYCERIN 0.4 MG SL tablet  Commonly known as:  NITROSTAT  Place 0.4 mg under the tongue every 5 (five) minutes as needed for chest pain.     potassium chloride 10 MEQ CR capsule  Commonly known as:  MICRO-K  Take 10 mEq by mouth daily.     ranitidine 150 MG tablet  Commonly known as:  ZANTAC  Take 150 mg by mouth 2 (two) times daily.     topiramate 100 MG tablet  Commonly known as:  TOPAMAX  Take 100 mg by mouth 2 (two) times daily. Patient states she also take a 200mg  tablet     topiramate 200 MG tablet  Commonly known as:  TOPAMAX  Take 200 mg by mouth 2 (two) times daily.       No Known Allergies     Follow-up Information   Follow up with OSEI-BONSU,GEORGE, MD. (in 1week, cll for appt upon discharge, take log of blood sugars to appt)    Contact information:   [address], [address] [place] [address] [phone]       Follow up with Robynn Pane, MD. (in 2weeks, call for appt upon discharge)    Contact  information:   [address] [place] [address] [phone]        The results of significant diagnostics from this hospitalization (including imaging, microbiology, ancillary and laboratory) are listed below for reference.    Significant Diagnostic Studies: Dg Chest 2 View  12/28/2012   *RADIOLOGY REPORT*  Clinical Data: Chest pain and shortness of breath.  CHEST - 2 VIEW  Comparison: No priors.  Findings: Lung volumes are normal.  No consolidative airspace disease.  No pleural effusions.  No pneumothorax.  No pulmonary nodule or mass noted.  Pulmonary vasculature and the cardiomediastinal silhouette are within normal limits.  IMPRESSION: 1. No radiographic evidence of acute cardiopulmonary disease.   Original Report Authenticated By: Trudie Reed, M.D.   Nm Myocar Single W/spect W/wall Motion And Ef  12/31/2012   **ADDENDUM** CREATED: 12/31/2012 15:53:12  The patient returned and rest imaging was performed following the intravenous administration of 13 mCi of technetium 42m sestamibi. Similar to the the stress images there is a moderately decreased uptake within the apical and mid segments of the anterior wall and anterior septum. On the gated images there is normal wall motion within this area.  IMPRESSION:  1.  Fixed moderate size defect involving the apical and mid segments of the anterior wall and anterior septum.  This likely reflects nonuniform soft tissue attenuation artifact.  No specific features identified to suggest myocardial reversibility  .**END ADDENDUM** SIGNED BY: Rosealee Albee, M.D.  12/30/2012   *RADIOLOGY REPORT*  Clinical Data:  Chest pain.  History of diabetes and hypertension.  MYOCARDIAL IMAGING WITH SPECT (PHARMACOLOGIC-STRESS) GATED LEFT VENTRICULAR WALL MOTION STUDY LEFT VENTRICULAR EJECTION FRACTION  Technique: Rest examination was performed yesterday. Unfortunately, due to camera malfunction, the rest portion of the study was lost.  Today,  intravenous infusion of Lexiscan was performed under the supervision of the Cardiology staff.  At peak effect of the drug, 30 mCi Tc-63m  was injected intravenously and standard myocardial SPECT imaging was performed.  Quantitative gated imaging was also performed to evaluate left ventricular wall motion, and estimate left ventricular ejection fraction.  Comparison:  Chest radiographs 12/28/2012.  Chest CT 07/18/2010.  Findings:  The stress only SPECT images from this examination demonstrate moderately decreased activity throughout the anterior wall.  The left ventricular activity is otherwise normal.  Gated images were reviewed and demonstrate normal left ventricular wall motion and systolic thickening.  Specifically, the anterior wall moves normally. The QGS ejection fraction measured at rest is 78% with an end diastolic volume of 46 ml and an end-systolic volume of 10 ml.  IMPRESSION:  1.  This stress only examination demonstrates probable breast attenuation of the anterior wall. Without a rest examination, a reversible perfusion defect cannot be completely excluded. 2.  Normal ventricular wall motion with calculated ejection fraction of 78%.  As noted above, the rest images obtained yesterday were lost due to camera malfunction. The patient may be returned for a repeat rest exam if clinically warranted.  Original Report Authenticated By: Carey Bullocks, M.D.    Microbiology: No results found for this or any previous visit (from the past 240 hour(s)).   Labs: Basic Metabolic Panel:  Recent Labs Lab 12/28/12 1155 12/28/12 1622 12/31/12 0454  NA 141 141 138  K 3.4* 3.7 4.2  CL 101 101 107  CO2 29 32 26  GLUCOSE 144* 149* 195*  BUN 18 18 20   CREATININE 1.00 0.99 0.91  CALCIUM 10.1 9.8 9.0  MG  --  2.1  --    Liver Function Tests:  Recent Labs Lab 12/28/12 1155 12/28/12 1622  AST 15 15  ALT 12 11  ALKPHOS 80 74  BILITOT 0.3 0.2*  PROT 7.4 6.9  ALBUMIN 3.6 3.3*   No results found  for this basename: LIPASE, AMYLASE,  in the last 168 hours No results found for this basename: AMMONIA,  in the last 168 hours CBC:  Recent Labs Lab 12/28/12 1155 12/28/12 1622 12/29/12 2300 12/30/12 1225 12/31/12 0454  WBC 11.8* 11.3* 11.1* 13.4* 8.2  NEUTROABS 8.7* 8.2*  --   --   --   HGB 13.9 13.4 13.0 12.9 11.7*  HCT 42.2 39.9 39.1 38.8 36.7  MCV 87.0 87.1 88.1 87.2 88.9  PLT 272 291 238 318 226   Cardiac Enzymes:  Recent Labs Lab 12/28/12 1155 12/28/12 1622 12/28/12 2109 12/29/12 0125  TROPONINI <0.30 <0.30 <0.30 <0.30   BNP: BNP (last 3 results)  Recent Labs  12/28/12 1155 12/28/12 1622  PROBNP 87.5 89.5   CBG:  Recent Labs Lab 12/30/12 2344 12/31/12 0411 12/31/12 0802 12/31/12 1146 12/31/12 1648  GLUCAP 162* 183* 139* 213* 166*       Signed:  Skyylar Kopf C  [facility] 12/31/2012, 5:45 PM

## 2012-12-31 NOTE — Progress Notes (Signed)
Dr. Sharyn Lull called and told me to inform primary MD that the stress test was ok and to send the patient home. I setn Dr. Suanne Marker a text page. Awaiting return of call

## 2012-12-31 NOTE — Progress Notes (Signed)
ANTICOAGULATION CONSULT NOTE - Follow Up Consult  Pharmacy Consult:  Heparin Indication:  ACS  No Known Allergies  Patient Measurements: Height: 5' 1.02" (155 cm) Weight: 245 lb (111.131 kg) IBW/kg (Calculated) : 47.86 Heparin Dosing Weight: 75 kg  Vital Signs: Temp: 97.6 F (36.4 C) (07/24 0412) Temp src: Oral (07/24 0412) BP: 105/61 mmHg (07/24 0412) Pulse Rate: 66 (07/24 0412)  Labs:  Recent Labs  12/28/12 1155 12/28/12 1622 12/28/12 2109  12/29/12 0125  12/29/12 2300 12/30/12 1225 12/30/12 1413 12/30/12 2301 12/31/12 0454  HGB 13.9 13.4  --   --   --   --  13.0 12.9  --   --  11.7*  HCT 42.2 39.9  --   --   --   --  39.1 38.8  --   --  36.7  PLT 272 291  --   --   --   --  238 318  --   --  226  LABPROT  --  12.8  --   --   --   --   --   --   --   --   --   INR  --  0.98  --   --   --   --   --   --   --   --   --   HEPARINUNFRC  --   --   --   < >  --   < > 1.00*  --  0.23* 0.32 0.41  CREATININE 1.00 0.99  --   --   --   --   --   --   --   --  0.91  TROPONINI <0.30 <0.30 <0.30  --  <0.30  --   --   --   --   --   --   < > = values in this interval not displayed.  Estimated Creatinine Clearance: 71.2 ml/min (by C-G formula based on Cr of 0.91).     Assessment: 65 YOF admitted with three day history of intermittent chest pain and shortness of breath and started on IV heparin.  S/p stress test 12/30/12 - results pending.  Heparin level therapeutic; no bleeding reported.   Goal of Therapy:  Heparin level 0.3-0.7 units/ml Monitor platelets by anticoagulation protocol: Yes    Plan:  - Heparin gtt at 800 units/hr - Daily HL / CBC - F/U stress test result and AC plans, ?still need NTG paste    Izaan Kingbird D. Laney Potash, PharmD, BCPS Pager:  [phone] 12/31/2012, 8:31 AM

## 2013-02-26 ENCOUNTER — Encounter (HOSPITAL_COMMUNITY): Payer: Self-pay | Admitting: Pharmacy Technician

## 2013-03-02 ENCOUNTER — Ambulatory Visit (HOSPITAL_COMMUNITY)
Admission: RE | Admit: 2013-03-02 | Discharge: 2013-03-02 | Disposition: A | Discharge status: Home / Self Care | Payer: Medicare HMO | Source: Ambulatory Visit | Attending: Cardiology | Admitting: Cardiology

## 2013-03-02 ENCOUNTER — Encounter (HOSPITAL_COMMUNITY)
Admission: RE | Disposition: A | Discharge status: Home / Self Care | Payer: Self-pay | Source: Ambulatory Visit | Attending: Cardiology

## 2013-03-02 DIAGNOSIS — I10 Essential (primary) hypertension: Secondary | ICD-10-CM | POA: Insufficient documentation

## 2013-03-02 DIAGNOSIS — E785 Hyperlipidemia, unspecified: Secondary | ICD-10-CM | POA: Insufficient documentation

## 2013-03-02 DIAGNOSIS — E119 Type 2 diabetes mellitus without complications: Secondary | ICD-10-CM | POA: Insufficient documentation

## 2013-03-02 DIAGNOSIS — R079 Chest pain, unspecified: Secondary | ICD-10-CM | POA: Insufficient documentation

## 2013-03-02 DIAGNOSIS — I209 Angina pectoris, unspecified: Secondary | ICD-10-CM | POA: Insufficient documentation

## 2013-03-02 HISTORY — PX: LEFT HEART CATHETERIZATION WITH CORONARY ANGIOGRAM: SHX5451

## 2013-03-02 LAB — GLUCOSE, CAPILLARY: Glucose-Capillary: 83 mg/dL (ref 70–99)

## 2013-03-02 SURGERY — LEFT HEART CATHETERIZATION WITH CORONARY ANGIOGRAM
Anesthesia: LOCAL

## 2013-03-02 MED ORDER — SODIUM CHLORIDE 0.9 % IJ SOLN: 3.0000 mL | INTRAMUSCULAR | Status: DC | PRN

## 2013-03-02 MED ORDER — LIDOCAINE HCL (PF) 1 % IJ SOLN
INTRAMUSCULAR | Status: AC
  Filled 2013-03-02: qty 30

## 2013-03-02 MED ORDER — SODIUM CHLORIDE 0.9 % IV SOLN: INTRAVENOUS | Status: DC

## 2013-03-02 MED ORDER — NITROGLYCERIN 0.2 MG/ML ON CALL CATH LAB
INTRAVENOUS | Status: AC
  Filled 2013-03-02: qty 1

## 2013-03-02 MED ORDER — ASPIRIN 81 MG PO CHEW
324.0000 mg | CHEWABLE_TABLET | ORAL | Status: AC
  Administered 2013-03-02: 324 mg via ORAL

## 2013-03-02 MED ORDER — HEPARIN (PORCINE) IN NACL 2-0.9 UNIT/ML-% IJ SOLN
INTRAMUSCULAR | Status: AC
  Filled 2013-03-02: qty 1000

## 2013-03-02 MED ORDER — ASPIRIN 81 MG PO CHEW
CHEWABLE_TABLET | ORAL | Status: AC
  Filled 2013-03-02: qty 4

## 2013-03-02 MED ORDER — SODIUM CHLORIDE 0.9 % IJ SOLN: 3.0000 mL | Freq: Two times a day (BID) | INTRAMUSCULAR | Status: DC

## 2013-03-02 MED ORDER — HEPARIN SODIUM (PORCINE) 1000 UNIT/ML IJ SOLN
INTRAMUSCULAR | Status: AC
  Filled 2013-03-02: qty 1

## 2013-03-02 MED ORDER — VERAPAMIL HCL 2.5 MG/ML IV SOLN
INTRAVENOUS | Status: AC
  Filled 2013-03-02: qty 2

## 2013-03-02 MED ORDER — SODIUM CHLORIDE 0.9 % IV SOLN: 250.0000 mL | INTRAVENOUS | Status: DC | PRN

## 2013-03-02 MED ORDER — HYDROMORPHONE HCL PF 2 MG/ML IJ SOLN
INTRAMUSCULAR | Status: AC
  Filled 2013-03-02: qty 1

## 2013-03-02 MED ORDER — MIDAZOLAM HCL 2 MG/2ML IJ SOLN
INTRAMUSCULAR | Status: AC
  Filled 2013-03-02: qty 2

## 2013-03-02 NOTE — Interval H&P Note (Signed)
History and Physical Interval Note:  03/02/2013 10:26 AM  Rob Bunting  has presented today for surgery, with the diagnosis of angina  The various methods of treatment have been discussed with the patient and family. After consideration of risks, benefits and other options for treatment, the patient has consented to  Procedure(s): LEFT HEART CATHETERIZATION WITH CORONARY ANGIOGRAM (N/A) and possible PTCA  as a surgical intervention .  The patient's history has been reviewed, patient examined, no change in status, stable for surgery.  I have reviewed the patient's chart and labs.  Questions were answered to the patient's satisfaction.   Cath Lab Visit (complete for each Cath Lab visit)  Clinical Evaluation Leading to the Procedure:   ACS: yes  Non-ACS:    Anginal Classification: CCS III  Anti-ischemic medical therapy: Maximal Therapy (2 or more classes of medications)  Non-Invasive Test Results: Low-risk stress test findings: cardiac mortality <1%/year  Prior CABG: No previous CABG         [facility]

## 2013-03-02 NOTE — CV Procedure (Signed)
Procedure performed:  Left heart catheterization including hemodynamic monitoring of the left ventricle, LV gram, selective right and left coronary arteriography.  Indication patient is a 5 AA [age] year-old female with history of hypertension,  hyperlipidemia,  Diabetes Mellitus   who presents with recurrent chest pian. Patient has  had non invasive testing which was  Low risk, but due to cardiac risks and persistent chest pain, brought to the cardiac catheterization lab to evaluate the  coronary anatomy for definitive diagnosis of CAD.  Hemodynamic data:  Left ventricular pressure was 99/3 with LVEDP of 7 mm mercury. Aortic pressure was 97/60 with a mean of 77 mm mercury. There was no pressure gradient across the aortic valve  Left ventricle: Performed in the RAO projection revealed LVEF of 55-60%. There was No MR. No wall motion abnormality.  Right coronary artery: The vessel is smooth, normal,  Dominant.  Left main coronary artery is large and normal.  Circumflex coronary artery: A large vessel giving origin to several small obtuse marginals. It is smooth and normal.   LAD:  LAD gives origin to a large diagonal-1.  LAD has midsegment mild calcification, with a 10-20% stenosis, otherwise smooth and normal.  Impression: Patient has no significant coronary artery disease. She had very minimal calcification of the midsegment of the LAD with a 10-20% at most luminal irregularity. She probably has just been related to microvascular disease or noncardiac cause for chest pain she be evaluated.   Technique: Under sterile precautions using a 6 French right radial  arterial access, a 6 French sheath was introduced into the right radial artery. A 5 [place] Tig 4 catheter was advanced into the ascending aorta selective  right coronary artery and left coronary artery was cannulated and angiography was performed in multiple views. The catheter was pulled back Out of the body over exchange length  J-wire.  Same Catheter was used to perform LV gram which was performed in LAO projection.  Catheter exchanged out of the body over J-Wire. NO immediate complications noted. Patient tolerated the procedure well.   Rec: Medical therapy with aggressive risk factor reduction.   Disposition: Will be discharged home today with outpatient follow up.

## 2013-03-02 NOTE — H&P (Signed)
  Please see office visit notes for complete details of HPI.  

## 2013-10-25 ENCOUNTER — Other Ambulatory Visit (HOSPITAL_BASED_OUTPATIENT_CLINIC_OR_DEPARTMENT_OTHER): Payer: Self-pay | Admitting: Internal Medicine

## 2013-10-25 DIAGNOSIS — Z1231 Encounter for screening mammogram for malignant neoplasm of breast: Secondary | ICD-10-CM

## 2013-10-27 ENCOUNTER — Ambulatory Visit (HOSPITAL_BASED_OUTPATIENT_CLINIC_OR_DEPARTMENT_OTHER)
Admission: RE | Admit: 2013-10-27 | Discharge: 2013-10-27 | Disposition: A | Discharge status: Home / Self Care | Payer: Medicare Other | Source: Ambulatory Visit | Attending: Internal Medicine | Admitting: Internal Medicine

## 2013-10-27 DIAGNOSIS — Z1231 Encounter for screening mammogram for malignant neoplasm of breast: Secondary | ICD-10-CM | POA: Insufficient documentation

## 2013-11-29 ENCOUNTER — Encounter: Payer: Self-pay | Admitting: Neurology

## 2013-11-29 ENCOUNTER — Encounter (INDEPENDENT_AMBULATORY_CARE_PROVIDER_SITE_OTHER): Payer: Self-pay

## 2013-11-29 ENCOUNTER — Ambulatory Visit (INDEPENDENT_AMBULATORY_CARE_PROVIDER_SITE_OTHER): Payer: Medicare Other | Admitting: Neurology

## 2013-11-29 VITALS — BP 150/75 | HR 79 | Ht 61.0 in | Wt 241.0 lb

## 2013-11-29 DIAGNOSIS — G40909 Epilepsy, unspecified, not intractable, without status epilepticus: Secondary | ICD-10-CM

## 2013-11-29 DIAGNOSIS — E1169 Type 2 diabetes mellitus with other specified complication: Secondary | ICD-10-CM

## 2013-11-29 DIAGNOSIS — M545 Low back pain, unspecified: Secondary | ICD-10-CM

## 2013-11-29 DIAGNOSIS — R269 Unspecified abnormalities of gait and mobility: Secondary | ICD-10-CM

## 2013-11-29 DIAGNOSIS — I10 Essential (primary) hypertension: Secondary | ICD-10-CM

## 2013-11-29 NOTE — Progress Notes (Signed)
PATIENT: [name] [name] DOB: [birth year]  HISTORICAL  Dianey Suchy  is a 68 year old right-handed African American female, accompanied by her daughter [name] referred by her primary care physician [name] for evaluation of seizure, abnormal MRI of brain, she was recently evaluated by [facility] Neurologist. [name].  She had a history of seizure since age 80, generalized tonic-clonic seizure, no warning signs, initially she was treated with Dilantin and phenobarbital, recent 10 years, she had been treated with titrating dose of Topamax 100 +200 mg twice a day, Keppra 750 mg twice a day, she denies significant side effects.  Most recent seizure was in April 2015, she also had recent MRI of the brain, was told that she had small vessel disease, but per patient, there was no change in treatment plan,  She also complains of few years history of rapid onset gait difficulty, occasional urinary incontinence, she has past medical history of obesity, insulin-dependent diabetes, hypertension.  She has never driven live at home with her husband,   REVIEW OF SYSTEMS: Full 14 system review of systems performed and notable only for swelling in legs, blurred vision, loss of vision, snoring, feeling cold, joint pain, swelling, cramps, headaches, snoring, seizure, decreased energy  ALLERGIES: No Known Allergies  HOME MEDICATIONS: Current Outpatient Prescriptions on File Prior to Visit  Medication Sig Dispense Refill  . albuterol (PROVENTIL HFA;VENTOLIN HFA) 108 (90 BASE) MCG/ACT inhaler Inhale 2 puffs into the lungs every 6 (six) hours as needed for wheezing.      Marland Kitchen aspirin 81 MG chewable tablet Chew 81 mg by mouth daily.      . cholecalciferol (VITAMIN D) 1000 UNITS tablet Take 1 tablet (1,000 Units total) by mouth daily.      . dorzolamide (TRUSOPT) 2 % ophthalmic solution Place 1 drop into both eyes 3 (three) times daily.      Marland Kitchen gabapentin (NEURONTIN) 300 MG capsule Take 300 mg by mouth 3  (three) times daily.      . hydrochlorothiazide (HYDRODIURIL) 25 MG tablet Take 25 mg by mouth daily.        . insulin glargine (LANTUS) 100 UNIT/ML injection Inject 0.3 mLs (30 Units total) into the skin at bedtime.  10 mL  12  . ranitidine (ZANTAC) 150 MG tablet Take 150 mg by mouth 2 (two) times daily.        Marland Kitchen topiramate (TOPAMAX) 200 MG tablet Take 200 mg by mouth 2 (two) times daily.          PAST MEDICAL HISTORY: Past Medical History  Diagnosis Date  . Hypertension   . GERD (gastroesophageal reflux disease)   . Enlarged heart   . Chronic bronchitis     "get it q yr" (12/28/2012)  . OSA on CPAP   . Type II diabetes mellitus   . FWYOVZCH(885.0)     "maybe 2/month" (12/28/2012)  . Grand mal seizure ~ 1962    "last sz was 08/2012; long time before that" (12/28/2012)  . Arthritis     "all over my body" (12/28/2012)  . Chronic lower back pain     PAST SURGICAL HISTORY: Past Surgical History  Procedure Laterality Date  . Abdominal hysterectomy  1970's  . Dilation and curettage of uterus  1970's  . Cataract extraction w/ intraocular lens  implant, bilateral Bilateral ~ 2012    FAMILY HISTORY: Family History  Problem Relation Age of Onset  . Diabetes type II Sister   . Coronary artery disease Sister  SOCIAL HISTORY:  History   Social History  . Marital Status: Married    Spouse Name: N/A    Number of Children: 1  . Years of Education: N/A   Occupational History  . Not working, not driving   Social History Main Topics  . Smoking status: Never Smoker   . Smokeless tobacco: Never Used  . Alcohol Use: No  . Drug Use: No  . Sexual Activity: No   Other Topics Concern  . Not on file   Social History Narrative  . No narrative on file    PHYSICAL EXAM   Filed Vitals:   11/29/13 1024  BP: 150/75  Pulse: 79  Height: 5\' 1"  (1.549 m)  Weight: 241 lb (109.317 kg)    Not recorded    Body mass index is 45.56 kg/(m^2).   Generalized: In no acute  distress  Neck: Supple, no carotid bruits   Cardiac: Regular rate rhythm  Pulmonary: Clear to auscultation bilaterally  Musculoskeletal: No deformity  Neurological examination  Mentation: Alert oriented to time, place, history taking, and causual conversation, obese, mild head titubation  Cranial nerve II-XII: Pupils were equal round reactive to light. Extraocular movements were full.  Visual field were full on confrontational test. Bilateral fundi were sharp.  Facial sensation and strength were normal. Hearing was intact to finger rubbing bilaterally. Uvula tongue midline.  Head turning and shoulder shrug and were normal and symmetric.Tongue protrusion into cheek strength was normal.  Motor: Normal tone, bulk and strength.  Sensory: Intact to fine touch, pinprick, preserved vibratory sensation, and proprioception at toes.  Coordination: Normal finger to nose, heel-to-shin bilaterally there was no truncal ataxia  Gait: wide based, cautious, unsteady  Romberg signs: Negative  Deep tendon reflexes: Brachioradialis 2/2, biceps 2/2, triceps 2/2, patellar 3/3, Achilles 2/2, plantar responses were extensor bilaterally.   DIAGNOSTIC DATA (LABS, IMAGING, TESTING) - I reviewed patient records, labs, notes, testing and imaging myself where available.  Lab Results  Component Value Date   WBC 8.2 12/31/2012   HGB 11.7* 12/31/2012   HCT 36.7 12/31/2012   MCV 88.9 12/31/2012   PLT 226 12/31/2012      Component Value Date/Time   NA 138 12/31/2012 0454   K 4.2 12/31/2012 0454   CL 107 12/31/2012 0454   CO2 26 12/31/2012 0454   GLUCOSE 195* 12/31/2012 0454   BUN 20 12/31/2012 0454   CREATININE 0.91 12/31/2012 0454   CALCIUM 9.0 12/31/2012 0454   PROT 6.9 12/28/2012 1622   ALBUMIN 3.3* 12/28/2012 1622   AST 15 12/28/2012 1622   ALT 11 12/28/2012 1622   ALKPHOS 74 12/28/2012 1622   BILITOT 0.2* 12/28/2012 1622   GFRNONAA 65* 12/31/2012 0454   GFRAA 75* 12/31/2012 0454   Lab Results  Component  Value Date   CHOL 157 12/28/2012   HDL 63 12/28/2012   LDLCALC 70 12/28/2012   TRIG 121 12/28/2012   CHOLHDL 2.5 12/28/2012   Lab Results  Component Value Date   HGBA1C 7.7* 12/28/2012   No results found for this basename: VITAMINB12   Lab Results  Component Value Date   TSH 1.222 12/28/2012    ASSESSMENT AND PLAN  Sharyn Brilliant is a 68 y.o. female with past medical history of hypertension, insulin-dependent diabetes, obesity, epilepsy, presenting with gradual onset gait difficulty,   DDx: cervical spondylitic myelopathy, small vessel disease  1.  MRi of the brain CD to review. 2. MRI cervical 3. Home PT 4. EEg 5. Topamax, keppra  level. 6. RTC in one month    Marcial Pacas, M.D. Ph.D.  [facility] [address] [place], [place] [address] [phone]

## 2013-11-30 ENCOUNTER — Telehealth: Payer: Self-pay | Admitting: Neurology

## 2013-11-30 LAB — TOPIRAMATE LEVEL: Topiramate Lvl: 15.6 ug/mL (ref 2.0–25.0)

## 2013-11-30 LAB — LEVETIRACETAM LEVEL: Levetiracetam Lvl: 8.6 ug/mL — ABNORMAL LOW (ref 10.0–40.0)

## 2013-11-30 NOTE — Telephone Encounter (Signed)
Please call patient, normal Topamax level, low Keppra level, no change of her medications, she should bring MRI cd for me to review at her follow up visit.

## 2013-12-01 NOTE — Telephone Encounter (Signed)
Called pt to inform her per Dr. Krista Blue that the pt has normal topamax level, low keppra level and that there were no changes in pt medications and that the pt should bring the MRI cd for Dr. Krista Blue to review at pt's next f/u appt. I advised the pt the if she has any other problems, questions or concerns to call the office. Pt verbalized understanding.

## 2013-12-02 ENCOUNTER — Ambulatory Visit (INDEPENDENT_AMBULATORY_CARE_PROVIDER_SITE_OTHER): Payer: Medicare Other

## 2013-12-02 DIAGNOSIS — I10 Essential (primary) hypertension: Secondary | ICD-10-CM

## 2013-12-02 DIAGNOSIS — R269 Unspecified abnormalities of gait and mobility: Secondary | ICD-10-CM

## 2013-12-02 DIAGNOSIS — E1169 Type 2 diabetes mellitus with other specified complication: Secondary | ICD-10-CM

## 2013-12-02 DIAGNOSIS — M545 Low back pain, unspecified: Secondary | ICD-10-CM

## 2013-12-02 DIAGNOSIS — G40909 Epilepsy, unspecified, not intractable, without status epilepticus: Secondary | ICD-10-CM

## 2013-12-07 NOTE — Procedures (Addendum)
   HISTORY:  69 year old female, with history of seizure  TECHNIQUE:  16 channel EEG was performed based on standard 10-16 international system. One channel was dedicated to EKG, which has demonstrates normal sinus rhythm of 60 beats per minutes.  Upon awakening, the posterior background activity was well-developed, in alpha range, 8Hz , with amplitude of 35 microvoltage, reactive to eye opening and closure.  There was no evidence of epileptiform discharge. There was frequent bilateral frontal muscle artifacts  Photic stimulation was performed, which induced a symmetric photic driving.  Hyperventilation was performed, there was no abnormality elicit.  No sleep was achieved.  CONCLUSION: This is a  normal awake  EEG.  There is no electrodiagnostic evidence of epileptiform discharge

## 2013-12-08 NOTE — Telephone Encounter (Signed)
MRI report from [facility] August 25 2013:  Abnormal MRI brain, w/wo,  Moderate white matter disease, mainly occipital, likely chronic small vessel disease.

## 2014-01-04 ENCOUNTER — Ambulatory Visit: Payer: Medicare Other | Admitting: Neurology

## 2014-01-07 ENCOUNTER — Ambulatory Visit (INDEPENDENT_AMBULATORY_CARE_PROVIDER_SITE_OTHER): Payer: Medicare Other | Admitting: Neurology

## 2014-01-07 ENCOUNTER — Telehealth: Payer: Self-pay | Admitting: *Deleted

## 2014-01-07 ENCOUNTER — Encounter: Payer: Self-pay | Admitting: Neurology

## 2014-01-07 VITALS — BP 160/88 | HR 72 | Ht 61.0 in | Wt 236.0 lb

## 2014-01-07 DIAGNOSIS — M549 Dorsalgia, unspecified: Secondary | ICD-10-CM

## 2014-01-07 DIAGNOSIS — I10 Essential (primary) hypertension: Secondary | ICD-10-CM

## 2014-01-07 DIAGNOSIS — R269 Unspecified abnormalities of gait and mobility: Secondary | ICD-10-CM

## 2014-01-07 DIAGNOSIS — M5489 Other dorsalgia: Secondary | ICD-10-CM

## 2014-01-07 NOTE — Progress Notes (Signed)
PATIENT: [name] [name] DOB: [birth year]  HISTORICAL  [name] [name]  is a 68 year old right-handed African American female, accompanied by her daughter [name] referred by her primary care physician [name] for evaluation of seizure, abnormal MRI of brain, she was recently evaluated by [facility] Neurologist. [name].  She had a history of seizure since age 50, generalized tonic-clonic seizure, no warning signs, initially she was treated with Dilantin and phenobarbital, recent 10 years, she had been treated with titrating dose of Topamax 100 +200 mg twice a day, Keppra 750 mg twice a day, she denies significant side effects.  Most recent seizure was in April 2015, she also had recent MRI of the brain, was told that she had small vessel disease, but per patient, there was no change in treatment plan,  She also complains of few years history of rapid onset gait difficulty, occasional urinary incontinence, she has past medical history of obesity, insulin-dependent diabetes, hypertension.  She has never driven live at home with her husband,  UPDATE January 07 2014:  She has no recurrent seizure, continue have significant gait difficulty, denies significant bilateral lower extremity paresthesia, she denies bowel bladder incontinence, we have reviewed MRI of the brain from March 2015, from [facility] neurologist, moderate periventricular white matter disease, no acute lesions  EEG was normal   Topamax level was 15.6, Keppra level was 8:6, now taking Topamax 100 plus 200 mg twice a day, Keppra 750 mg twice a day, she is tolerating the medication well, there is no significant side effect.  REVIEW OF SYSTEMS: Full 14 system review of systems performed and notable only for gait difficulty, headaches, ALLERGIES: No Known Allergies  HOME MEDICATIONS: Current Outpatient Prescriptions on File Prior to Visit  Medication Sig Dispense Refill  . albuterol (PROVENTIL HFA;VENTOLIN HFA) 108 (90  BASE) MCG/ACT inhaler Inhale 2 puffs into the lungs every 6 (six) hours as needed for wheezing.      Marland Kitchen aspirin 81 MG chewable tablet Chew 81 mg by mouth daily.      . cholecalciferol (VITAMIN D) 1000 UNITS tablet Take 1 tablet (1,000 Units total) by mouth daily.      . dorzolamide (TRUSOPT) 2 % ophthalmic solution Place 1 drop into both eyes 3 (three) times daily.      Marland Kitchen gabapentin (NEURONTIN) 300 MG capsule Take 300 mg by mouth 3 (three) times daily.      . hydrochlorothiazide (HYDRODIURIL) 25 MG tablet Take 25 mg by mouth daily.        . insulin glargine (LANTUS) 100 UNIT/ML injection Inject 0.3 mLs (30 Units total) into the skin at bedtime.  10 mL  12  . ranitidine (ZANTAC) 150 MG tablet Take 150 mg by mouth 2 (two) times daily.        Marland Kitchen topiramate (TOPAMAX) 200 MG tablet Take 200 mg by mouth 2 (two) times daily.          PAST MEDICAL HISTORY: Past Medical History  Diagnosis Date  . Hypertension   . GERD (gastroesophageal reflux disease)   . Enlarged heart   . Chronic bronchitis     "get it q yr" (12/28/2012)  . OSA on CPAP   . Type II diabetes mellitus   . IZTIWPYK(998.3)     "maybe 2/month" (12/28/2012)  . Grand mal seizure ~ 1962    "last sz was 08/2012; long time before that" (12/28/2012)  . Arthritis     "all over my body" (12/28/2012)  . Chronic lower  back pain     PAST SURGICAL HISTORY: Past Surgical History  Procedure Laterality Date  . Abdominal hysterectomy  1970's  . Dilation and curettage of uterus  1970's  . Cataract extraction w/ intraocular lens  implant, bilateral Bilateral ~ 2012    FAMILY HISTORY: Family History  Problem Relation Age of Onset  . Diabetes type II Sister   . Coronary artery disease Sister     SOCIAL HISTORY:  History   Social History  . Marital Status: Married    Spouse Name: N/A    Number of Children: 1  . Years of Education: N/A   Occupational History  . Not working, not driving   Social History Main Topics  . Smoking  status: Never Smoker   . Smokeless tobacco: Never Used  . Alcohol Use: No  . Drug Use: No  . Sexual Activity: No   Other Topics Concern  . Not on file   Social History Narrative  . No narrative on file    PHYSICAL EXAM   Filed Vitals:   01/07/14 0851  BP: 160/88  Pulse: 72  Height: 5\' 1"  (1.549 m)  Weight: 236 lb (107.049 kg)    Not recorded    Body mass index is 44.61 kg/(m^2).   Generalized: In no acute distress  Neck: Supple, no carotid bruits   Cardiac: Regular rate rhythm  Pulmonary: Clear to auscultation bilaterally  Musculoskeletal: No deformity  Neurological examination  Mentation: Alert oriented to time, place, history taking, and causual conversation, obese, mild head titubation  Cranial nerve II-XII: Pupils were equal round reactive to light. Extraocular movements were full.  Visual field were full on confrontational test. Bilateral fundi were sharp.  Facial sensation and strength were normal. Hearing was intact to finger rubbing bilaterally. Uvula tongue midline.  Head turning and shoulder shrug and were normal and symmetric.Tongue protrusion into cheek strength was normal.  Motor: Normal tone, bulk and strength.  Sensory: Intact to fine touch, pinprick, preserved vibratory sensation, and proprioception at toes.  Coordination: Normal finger to nose, heel-to-shin bilaterally there was no truncal ataxia  Gait: wide based, cautious, unsteady,  stiffness  Romberg signs: Negative  Deep tendon reflexes: Brachioradialis 2/2, biceps 2/2, triceps 2/2, patellar 3/3, Achilles 2/2, plantar responses were extensor bilaterally.   DIAGNOSTIC DATA (LABS, IMAGING, TESTING) - I reviewed patient records, labs, notes, testing and imaging myself where available.  Lab Results  Component Value Date   WBC 8.2 12/31/2012   HGB 11.7* 12/31/2012   HCT 36.7 12/31/2012   MCV 88.9 12/31/2012   PLT 226 12/31/2012      Component Value Date/Time   NA 138 12/31/2012 0454    K 4.2 12/31/2012 0454   CL 107 12/31/2012 0454   CO2 26 12/31/2012 0454   GLUCOSE 195* 12/31/2012 0454   BUN 20 12/31/2012 0454   CREATININE 0.91 12/31/2012 0454   CALCIUM 9.0 12/31/2012 0454   PROT 6.9 12/28/2012 1622   ALBUMIN 3.3* 12/28/2012 1622   AST 15 12/28/2012 1622   ALT 11 12/28/2012 1622   ALKPHOS 74 12/28/2012 1622   BILITOT 0.2* 12/28/2012 1622   GFRNONAA 65* 12/31/2012 0454   GFRAA 75* 12/31/2012 0454   Lab Results  Component Value Date   CHOL 157 12/28/2012   HDL 63 12/28/2012   LDLCALC 70 12/28/2012   TRIG 121 12/28/2012   CHOLHDL 2.5 12/28/2012   Lab Results  Component Value Date   HGBA1C 7.7* 12/28/2012   No results found for  this basename: [id number]   Lab Results  Component Value Date   TSH 1.222 12/28/2012    ASSESSMENT AND PLAN  Ted Leonhart is a 68 y.o. female with past medical history of hypertension, insulin-dependent diabetes, obesity, epilepsy, presenting with gradual onset gait difficulty, MRI of the brain showed moderate small vessel disease,  DDx for gait difficulty including deconditioning, peripheral neuropathy,cervical spondylitic myelopathy, superimposed on lumbar radiculopathy  MRI of cervical, lumbar, EMG nerve conduction study Physical therapy Return to clinic in 2 month   Marcial Pacas, M.D. Ph.D.  [facility] [address] [place], [place] [address] [phone]

## 2014-01-19 ENCOUNTER — Ambulatory Visit
Admission: RE | Admit: 2014-01-19 | Discharge: 2014-01-19 | Disposition: A | Discharge status: Home / Self Care | Payer: Medicare Other | Source: Ambulatory Visit | Attending: Neurology | Admitting: Neurology

## 2014-01-19 ENCOUNTER — Encounter (INDEPENDENT_AMBULATORY_CARE_PROVIDER_SITE_OTHER): Payer: Self-pay

## 2014-01-19 DIAGNOSIS — M5489 Other dorsalgia: Secondary | ICD-10-CM

## 2014-01-19 DIAGNOSIS — R269 Unspecified abnormalities of gait and mobility: Secondary | ICD-10-CM

## 2014-01-19 DIAGNOSIS — I10 Essential (primary) hypertension: Secondary | ICD-10-CM

## 2014-01-20 NOTE — Progress Notes (Signed)
Quick Note:  WID thanks Dr.Yan out until 01-2014 ______

## 2014-01-25 NOTE — Progress Notes (Signed)
Quick Note:  WID Dr.Yan out of office until 01-27-2014 ______

## 2014-02-03 ENCOUNTER — Encounter (INDEPENDENT_AMBULATORY_CARE_PROVIDER_SITE_OTHER): Payer: Self-pay

## 2014-02-03 ENCOUNTER — Ambulatory Visit (INDEPENDENT_AMBULATORY_CARE_PROVIDER_SITE_OTHER): Payer: Medicare Other | Admitting: Neurology

## 2014-02-03 DIAGNOSIS — M5489 Other dorsalgia: Secondary | ICD-10-CM

## 2014-02-03 DIAGNOSIS — Z0289 Encounter for other administrative examinations: Secondary | ICD-10-CM

## 2014-02-03 DIAGNOSIS — R269 Unspecified abnormalities of gait and mobility: Secondary | ICD-10-CM

## 2014-02-03 DIAGNOSIS — I10 Essential (primary) hypertension: Secondary | ICD-10-CM

## 2014-02-03 DIAGNOSIS — G40909 Epilepsy, unspecified, not intractable, without status epilepticus: Secondary | ICD-10-CM

## 2014-02-03 NOTE — Telephone Encounter (Signed)
Dr.Yan Will go over results face to face about MRI at [facility]/EMG appt.

## 2014-02-03 NOTE — Progress Notes (Signed)
PATIENT: [name] [name] DOB: 04/17/1946  HISTORICAL  [name] [name]  is a 68 year old right-handed African American female, accompanied by her daughter [name] [name] referred by her primary care physician [name] for evaluation of seizure, abnormal MRI of brain, she was recently evaluated by [facility] Neurologist. [name].  She had a history of seizure since age 36, generalized tonic-clonic seizure, no warning signs, initially she was treated with Dilantin and phenobarbital, recent 10 years, she had been treated with titrating dose of Topamax 100 +200 mg twice a day, Keppra 750 mg twice a day, she denies significant side effects.  Most recent seizure was in April 2015, she also had recent MRI of the brain, was told that she had small vessel disease, but per patient, there was no change in treatment plan,  She also complains of few years history of rapid onset gait difficulty, occasional urinary incontinence, she has past medical history of obesity, insulin-dependent diabetes, hypertension.  She has never driven, live at home with her husband,   we have reviewed MRI of the brain from March 2015, from [facility] neurologist, moderate periventricular white matter disease, no acute lesions  EEG was normal  Topamax level was 15.6, Keppra level was 8:6,  while taking Topamax 100 plus 200 mg twice a day, Keppra 750 mg twice a day, she is tolerating the medication well, there is no significant side effect.  UPDATE August 27th 2015: She returned for electrodiagnostic study today, there was no evidence of large fiber peripheral neuropathy,: Left lumbar radiculopathy,  I also reviewed MRI film with patient, and her daughter,  MRI cervical showed multilevel degenerative disc disease, with mild to moderate foraminal stenosis at C4-5, mild canal stenosis at C5-6, C4-5, C3-4 but no evidence of cord abnormalities  MRI lumbar spine (without) demonstrating:  1. At L4-5: severe facet arthropathy  with fluid in the facet joints with moderate right and moderate-severe left foraminal stenosis; anterior spondylolisthesis of L4 on L5 (43mm) with pseudo-disc bulging.  2. There is sacralization of L5 with transitional features, particularly on the left side.  3. Multi-level facet hypertrophy.  4. Compared to MRI on 06/19/11 there is progression of foraminal stenosis at L4-5.   Patient complains of progressive worsening gait difficulty over the past few years, bilateral feet paresthesia, especially left side, but she denies bone bladder incontinence   REVIEW OF SYSTEMS: Full 14 system review of systems performed and notable only for gait difficulty, headaches, ALLERGIES: No Known Allergies  HOME MEDICATIONS: Current Outpatient Prescriptions on File Prior to Visit  Medication Sig Dispense Refill  . albuterol (PROVENTIL HFA;VENTOLIN HFA) 108 (90 BASE) MCG/ACT inhaler Inhale 2 puffs into the lungs every 6 (six) hours as needed for wheezing.      Marland Kitchen aspirin 81 MG chewable tablet Chew 81 mg by mouth daily.      . cholecalciferol (VITAMIN D) 1000 UNITS tablet Take 1 tablet (1,000 Units total) by mouth daily.      . dorzolamide (TRUSOPT) 2 % ophthalmic solution Place 1 drop into both eyes 3 (three) times daily.      Marland Kitchen gabapentin (NEURONTIN) 300 MG capsule Take 300 mg by mouth 3 (three) times daily.      . hydrochlorothiazide (HYDRODIURIL) 25 MG tablet Take 25 mg by mouth daily.        . insulin glargine (LANTUS) 100 UNIT/ML injection Inject 0.3 mLs (30 Units total) into the skin at bedtime.  10 mL  12  . ranitidine (ZANTAC) 150 MG  tablet Take 150 mg by mouth 2 (two) times daily.        Marland Kitchen topiramate (TOPAMAX) 200 MG tablet Take 200 mg by mouth 2 (two) times daily.          PAST MEDICAL HISTORY: Past Medical History  Diagnosis Date  . Hypertension   . GERD (gastroesophageal reflux disease)   . Enlarged heart   . Chronic bronchitis     "get it q yr" (12/28/2012)  . OSA on CPAP   . Type II  diabetes mellitus   . AYTKZSWF(093.2)     "maybe 2/month" (12/28/2012)  . Grand mal seizure ~ 1962    "last sz was 08/2012; long time before that" (12/28/2012)  . Arthritis     "all over my body" (12/28/2012)  . Chronic lower back pain     PAST SURGICAL HISTORY: Past Surgical History  Procedure Laterality Date  . Abdominal hysterectomy  1970's  . Dilation and curettage of uterus  1970's  . Cataract extraction w/ intraocular lens  implant, bilateral Bilateral ~ 2012    FAMILY HISTORY: Family History  Problem Relation Age of Onset  . Diabetes type II Sister   . Coronary artery disease Sister     SOCIAL HISTORY:  History   Social History  . Marital Status: Married    Spouse Name: N/A    Number of Children: 1  . Years of Education: N/A   Occupational History  . Not working, not driving   Social History Main Topics  . Smoking status: Never Smoker   . Smokeless tobacco: Never Used  . Alcohol Use: No  . Drug Use: No  . Sexual Activity: No   Other Topics Concern  . Not on file   Social History Narrative  . No narrative on file    PHYSICAL EXAM   There were no vitals filed for this visit.  Not recorded    There is no weight on file to calculate BMI.   Generalized: In no acute distress  Neck: Supple, no carotid bruits   Cardiac: Regular rate rhythm  Pulmonary: Clear to auscultation bilaterally  Musculoskeletal: No deformity  Neurological examination  Mentation: Alert oriented to time, place, history taking, and causual conversation, obese, mild head titubation  Cranial nerve II-XII: Pupils were equal round reactive to light. Extraocular movements were full.  Visual field were full on confrontational test. Bilateral fundi were sharp.  Facial sensation and strength were normal. Hearing was intact to finger rubbing bilaterally. Uvula tongue midline.  Head turning and shoulder shrug and were normal and symmetric.Tongue protrusion into cheek strength was  normal.  Motor: Normal tone, bulk and strength.  Sensory: Intact to fine touch, pinprick, preserved vibratory sensation, and proprioception at toes.  Coordination: Normal finger to nose, heel-to-shin bilaterally there was no truncal ataxia  Gait: wide based, cautious, unsteady,  Stiffness, bilateral knee valgrus Romberg signs: Negative  Deep tendon reflexes: Brachioradialis 2/2, biceps 2/2, triceps 2/2, patellar 3/3, Achilles 2/2, plantar responses were flexor bilaterally.   DIAGNOSTIC DATA (LABS, IMAGING, TESTING) - I reviewed patient records, labs, notes, testing and imaging myself where available.  Lab Results  Component Value Date   WBC 8.2 12/31/2012   HGB 11.7* 12/31/2012   HCT 36.7 12/31/2012   MCV 88.9 12/31/2012   PLT 226 12/31/2012      Component Value Date/Time   NA 138 12/31/2012 0454   K 4.2 12/31/2012 0454   CL 107 12/31/2012 0454   CO2 26 12/31/2012 0454  GLUCOSE 195* 12/31/2012 0454   BUN 20 12/31/2012 0454   CREATININE 0.91 12/31/2012 0454   CALCIUM 9.0 12/31/2012 0454   PROT 6.9 12/28/2012 1622   ALBUMIN 3.3* 12/28/2012 1622   AST 15 12/28/2012 1622   ALT 11 12/28/2012 1622   ALKPHOS 74 12/28/2012 1622   BILITOT 0.2* 12/28/2012 1622   GFRNONAA 65* 12/31/2012 0454   GFRAA 75* 12/31/2012 0454   Lab Results  Component Value Date   CHOL 157 12/28/2012   HDL 63 12/28/2012   LDLCALC 70 12/28/2012   TRIG 121 12/28/2012   CHOLHDL 2.5 12/28/2012   Lab Results  Component Value Date   HGBA1C 7.7* 12/28/2012   No results found for this basename: [id number]   Lab Results  Component Value Date   TSH 1.222 12/28/2012    ASSESSMENT AND PLAN  Aryiana Klinkner is a 68 y.o. female with past medical history of hypertension, insulin-dependent diabetes, obesity, epilepsy, presenting with gradual onset gait difficulty, MRI of the brain showed moderate small vessel disease, MRI cervical, showed multilevel degenerative disc disease, mild canal stenosis at C3-4, C4-5, C5-6 level, but no  significant cord signal changes. Multilevel lumbar degenerative disc disease, most severe at L4-5: severe facet arthropathy with fluid in the facet joints with moderate right and moderate-severe left foraminal stenosis; anterior spondylolisthesis of L4 on L5 (31mm) with pseudo-disc bulging.  DDx for her gait difficulty including deconditioning, obesity, will complete evaluation with MRI thoracic spine, to rule out thoracic myelopathy, she does has upper motor neuron signs, brisk reflexes, stiff gait, mild bilateral lower extremity spasticity  Home physical therapy Return to clinic in one month  Marcial Pacas, M.D. Ph.D.  [facility] [address], [place], [place] [address] [phone]

## 2014-02-03 NOTE — Procedures (Signed)
   NCS (NERVE CONDUCTION STUDY) WITH EMG (ELECTROMYOGRAPHY) REPORT   STUDY DATE: February 03 2014 PATIENT NAME: [name] [name] DOB: December 29, 1945 MRN: [id number]    TECHNOLOGIST: Laretta Alstrom  ELECTROMYOGRAPHER: Marcial Pacas M.D.  CLINICAL INFORMATION:  67 year old female, with progressive worsening gait difficulty or past few years, bilateral feet paresthesia, with past medical history of obesity, diabetes  On examination: Bilateral upper and lower extremity motor strength was normal, brisk bilateral lower extremity reflexes, plantar responses were flexor She has wide-based unsteady, stiff gait,  FINDINGS: NERVE CONDUCTION STUDY: Bilateral lower peroneal sensory responses were normal. Bilateral peroneal, tibial motor responses were normal. Tibial H. reflexes were absent  Right median, ulnar sensory and motor responses were normal  NEEDLE ELECTROMYOGRAPHY:  Selected needle examination was performed at left lower extremity muscles, left lumbosacral paraspinal muscles.  Needle examination of left tibialis anterior, tibialis posterior, peroneal longus, vastus lateralis, biceps femoris short head was normal  There was no spontaneous activity at left lumbosacral paraspinal muscles, left L4-5 S1  IMPRESSION:  This is a normal study. There was no electrodiagnostic evidence of large fiber peripheral neuropathy, or left lumbar radiculopathy.   INTERPRETING PHYSICIAN:   Marcial Pacas M.D. Ph.D. [facility] [address] [place], [place] [address] [phone]

## 2014-02-16 ENCOUNTER — Emergency Department (HOSPITAL_BASED_OUTPATIENT_CLINIC_OR_DEPARTMENT_OTHER)
Admission: EM | Admit: 2014-02-16 | Discharge: 2014-02-17 | Disposition: A | Discharge status: Home / Self Care | Payer: Medicare Other | Attending: Emergency Medicine | Admitting: Emergency Medicine

## 2014-02-16 ENCOUNTER — Emergency Department (HOSPITAL_BASED_OUTPATIENT_CLINIC_OR_DEPARTMENT_OTHER): Payer: Medicare Other

## 2014-02-16 ENCOUNTER — Encounter (HOSPITAL_BASED_OUTPATIENT_CLINIC_OR_DEPARTMENT_OTHER): Payer: Self-pay | Admitting: Emergency Medicine

## 2014-02-16 DIAGNOSIS — Z7982 Long term (current) use of aspirin: Secondary | ICD-10-CM | POA: Diagnosis not present

## 2014-02-16 DIAGNOSIS — Z79899 Other long term (current) drug therapy: Secondary | ICD-10-CM | POA: Diagnosis not present

## 2014-02-16 DIAGNOSIS — J42 Unspecified chronic bronchitis: Secondary | ICD-10-CM | POA: Insufficient documentation

## 2014-02-16 DIAGNOSIS — G4733 Obstructive sleep apnea (adult) (pediatric): Secondary | ICD-10-CM | POA: Insufficient documentation

## 2014-02-16 DIAGNOSIS — G40909 Epilepsy, unspecified, not intractable, without status epilepticus: Secondary | ICD-10-CM | POA: Insufficient documentation

## 2014-02-16 DIAGNOSIS — I10 Essential (primary) hypertension: Secondary | ICD-10-CM | POA: Insufficient documentation

## 2014-02-16 DIAGNOSIS — R071 Chest pain on breathing: Secondary | ICD-10-CM | POA: Insufficient documentation

## 2014-02-16 DIAGNOSIS — M129 Arthropathy, unspecified: Secondary | ICD-10-CM | POA: Insufficient documentation

## 2014-02-16 DIAGNOSIS — G8929 Other chronic pain: Secondary | ICD-10-CM | POA: Insufficient documentation

## 2014-02-16 DIAGNOSIS — R079 Chest pain, unspecified: Secondary | ICD-10-CM | POA: Diagnosis present

## 2014-02-16 DIAGNOSIS — R0789 Other chest pain: Secondary | ICD-10-CM

## 2014-02-16 DIAGNOSIS — E119 Type 2 diabetes mellitus without complications: Secondary | ICD-10-CM | POA: Insufficient documentation

## 2014-02-16 DIAGNOSIS — K219 Gastro-esophageal reflux disease without esophagitis: Secondary | ICD-10-CM | POA: Diagnosis not present

## 2014-02-16 NOTE — ED Notes (Signed)
PT presents to ED with complaints of left sided chest pain that started a couple days ago but worse today and dizzy today.

## 2014-02-17 LAB — COMPREHENSIVE METABOLIC PANEL
ALBUMIN: 3.5 g/dL (ref 3.5–5.2)
ALT: 13 U/L (ref 0–35)
ANION GAP: 12 (ref 5–15)
AST: 18 U/L (ref 0–37)
Alkaline Phosphatase: 94 U/L (ref 39–117)
BUN: 15 mg/dL (ref 6–23)
CALCIUM: 10.2 mg/dL (ref 8.4–10.5)
CO2: 27 mEq/L (ref 19–32)
Chloride: 98 mEq/L (ref 96–112)
Creatinine, Ser: 1 mg/dL (ref 0.50–1.10)
GFR calc non Af Amer: 57 mL/min — ABNORMAL LOW (ref >90)
GFR, EST AFRICAN AMERICAN: 67 mL/min — AB (ref >90)
GLUCOSE: 324 mg/dL — AB (ref 70–99)
Potassium: 3.9 mEq/L (ref 3.7–5.3)
SODIUM: 137 meq/L (ref 137–147)
TOTAL PROTEIN: 7.6 g/dL (ref 6.0–8.3)
Total Bilirubin: <0.2 mg/dL — ABNORMAL LOW (ref 0.3–1.2)

## 2014-02-17 LAB — CBC WITH DIFFERENTIAL/PLATELET
Basophils Absolute: 0 10*3/uL (ref 0.0–0.1)
Basophils Relative: 0 % (ref 0–1)
EOS PCT: 1 % (ref 0–5)
Eosinophils Absolute: 0.1 10*3/uL (ref 0.0–0.7)
HCT: 41.3 % (ref 36.0–46.0)
Hemoglobin: 13.7 g/dL (ref 12.0–15.0)
Lymphocytes Relative: 26 % (ref 12–46)
Lymphs Abs: 2.7 10*3/uL (ref 0.7–4.0)
MCH: 29.1 pg (ref 26.0–34.0)
MCHC: 33.2 g/dL (ref 30.0–36.0)
MCV: 87.9 fL (ref 78.0–100.0)
MONOS PCT: 7 % (ref 3–12)
Monocytes Absolute: 0.7 10*3/uL (ref 0.1–1.0)
Neutro Abs: 7 10*3/uL (ref 1.7–7.7)
Neutrophils Relative %: 66 % (ref 43–77)
PLATELETS: 255 10*3/uL (ref 150–400)
RBC: 4.7 MIL/uL (ref 3.87–5.11)
RDW: 13.8 % (ref 11.5–15.5)
WBC: 10.5 10*3/uL (ref 4.0–10.5)

## 2014-02-17 LAB — TROPONIN I
Troponin I: <0.3 ng/mL (ref <0.30)
Troponin I: <0.3 ng/mL (ref <0.30)

## 2014-02-17 MED ORDER — ONDANSETRON HCL 4 MG/2ML IJ SOLN
4.0000 mg | Freq: Once | INTRAMUSCULAR | Status: AC
  Administered 2014-02-17: 4 mg via INTRAVENOUS
  Filled 2014-02-17: qty 2

## 2014-02-17 MED ORDER — FENTANYL CITRATE 0.05 MG/ML IJ SOLN
100.0000 ug | Freq: Once | INTRAMUSCULAR | Status: AC
Start: 2014-02-17 — End: 2014-02-17
  Administered 2014-02-17: 100 ug via INTRAVENOUS
  Filled 2014-02-17: qty 2

## 2014-02-17 MED ORDER — HYDROCODONE-ACETAMINOPHEN 5-325 MG PO TABS: 1.0000 | ORAL_TABLET | Freq: Four times a day (QID) | ORAL | Status: DC | PRN

## 2014-02-17 NOTE — Discharge Instructions (Signed)

## 2014-02-17 NOTE — ED Provider Notes (Signed)
CSN: [id number]     Arrival date & time 02/16/14  2304 History   None    Chief Complaint  Patient presents with  . Chest Pain     (Consider location/radiation/quality/duration/timing/severity/associated sxs/prior Treatment) HPI This is a 68 year old female with a history of diabetes. She is here with a 3 to four-day history of chest pain. The chest pain is located in her lower sternum. She describes it as sharp. She describes it as an 8/10 in severity. It was intermittent at first but is now constant. It is worse when lying supine or with palpation. It is somewhat worse with breathing. She denies shortness of breath. She has had some nausea. She denies diaphoresis. The pain does radiate laterally across the chest. She characterizes it as different than previous GERD.  Past Medical History  Diagnosis Date  . Hypertension   . GERD (gastroesophageal reflux disease)   . Enlarged heart   . Chronic bronchitis     "get it q yr" (12/28/2012)  . OSA on CPAP   . Type II diabetes mellitus   . EEFEOFHQ(197.5)     "maybe 2/month" (12/28/2012)  . Grand mal seizure ~ 1962    "last sz was 08/2012; long time before that" (12/28/2012)  . Arthritis     "all over my body" (12/28/2012)  . Chronic lower back pain    Past Surgical History  Procedure Laterality Date  . Abdominal hysterectomy  1970's  . Dilation and curettage of uterus  1970's  . Cataract extraction w/ intraocular lens  implant, bilateral Bilateral ~ 2012   Family History  Problem Relation Age of Onset  . Diabetes type II Sister   . Coronary artery disease Sister    History  Substance Use Topics  . Smoking status: Never Smoker   . Smokeless tobacco: Never Used  . Alcohol Use: No   OB History   Grav Para Term Preterm Abortions TAB SAB Ect Mult Living                 Review of Systems  All other systems reviewed and are negative.   Allergies  Review of patient's allergies indicates no known allergies.  Home Medications    Prior to Admission medications   Medication Sig Start Date End Date Taking? Authorizing Provider  albuterol (PROVENTIL HFA;VENTOLIN HFA) 108 (90 BASE) MCG/ACT inhaler Inhale 2 puffs into the lungs every 6 (six) hours as needed for wheezing.    Historical Provider, MD  aspirin 81 MG chewable tablet Chew 81 mg by mouth daily.    Historical Provider, MD  cholecalciferol (VITAMIN D) 1000 UNITS tablet Take 1 tablet (1,000 Units total) by mouth daily. 12/31/12   Sheila Oats, MD  dorzolamide (TRUSOPT) 2 % ophthalmic solution Place 1 drop into both eyes 3 (three) times daily.    Historical Provider, MD  gabapentin (NEURONTIN) 300 MG capsule Take 300 mg by mouth 3 (three) times daily.    Historical Provider, MD  hydrochlorothiazide (HYDRODIURIL) 25 MG tablet Take 25 mg by mouth daily.      Historical Provider, MD  insulin glargine (LANTUS) 100 UNIT/ML injection Inject 0.3 mLs (30 Units total) into the skin at bedtime. 12/31/12   Sheila Oats, MD  levETIRAcetam (KEPPRA) 750 MG tablet Take 750 mg by mouth 2 (two) times daily.    Historical Provider, MD  losartan (COZAAR) 50 MG tablet Take 50 mg by mouth daily.    Historical Provider, MD  ranitidine (ZANTAC) 150 MG tablet  Take 150 mg by mouth 2 (two) times daily.      Historical Provider, MD  topiramate (TOPAMAX) 200 MG tablet Take 200 mg by mouth 2 (two) times daily.     Historical Provider, MD   BP 139/79  Pulse 66  Temp(Src) 97.9 F (36.6 C) (Oral)  Resp 20  Ht 5\' 1"  (1.549 m)  Wt 239 lb (108.41 kg)  BMI 45.18 kg/m2  SpO2 99%  Physical Exam General: Well-developed, well-nourished female in no acute distress; appearance consistent with age of record HENT: normocephalic; atraumatic Eyes: pupils equal, round and reactive to light; extraocular muscles intact Neck: supple Heart: regular rate and rhythm; no murmurs, rubs or gallops Lungs: clear to auscultation bilaterally Chest: lower sternal tenderness that reproduces the pain of the  chief complaint Abdomen: soft; nondistended; nontender; bowel sounds present Extremities: No deformity; full range of motion; pulses normal Neurologic: Awake, alert and oriented; motor function intact in all extremities and symmetric; no facial droop Skin: Warm and dry Psychiatric: Flat affect  ED Course  Procedures (including critical care time)   EKG Interpretation   Date/Time:  Wednesday February 16 2014 23:18:03 EDT Ventricular Rate:  85 PR Interval:  166 QRS Duration: 82 QT Interval:  390 QTC Calculation: 464 R Axis:   54 Text Interpretation:  Normal sinus rhythm Cannot rule out Anterior infarct  , age undetermined Abnormal ECG Rate is faster. Confirmed by Florina Ou  MD,  Jenny Reichmann ([id number]) on 02/16/2014 11:39:45 PM      MDM   Nursing notes and vitals signs, including pulse oximetry, reviewed.  Summary of this visit's results, reviewed by myself:  Labs:  Results for orders placed during the hospital encounter of 02/16/14 (from the past 24 hour(s))  CBC WITH DIFFERENTIAL     Status: None   Collection Time    02/17/14 12:04 AM      Result Value Ref Range   WBC 10.5  4.0 - 10.5 K/uL   RBC 4.70  3.87 - 5.11 MIL/uL   Hemoglobin 13.7  12.0 - 15.0 g/dL   HCT 41.3  36.0 - 46.0 %   MCV 87.9  78.0 - 100.0 fL   MCH 29.1  26.0 - 34.0 pg   MCHC 33.2  30.0 - 36.0 g/dL   RDW 13.8  11.5 - 15.5 %   Platelets 255  150 - 400 K/uL   Neutrophils Relative % 66  43 - 77 %   Neutro Abs 7.0  1.7 - 7.7 K/uL   Lymphocytes Relative 26  12 - 46 %   Lymphs Abs 2.7  0.7 - 4.0 K/uL   Monocytes Relative 7  3 - 12 %   Monocytes Absolute 0.7  0.1 - 1.0 K/uL   Eosinophils Relative 1  0 - 5 %   Eosinophils Absolute 0.1  0.0 - 0.7 K/uL   Basophils Relative 0  0 - 1 %   Basophils Absolute 0.0  0.0 - 0.1 K/uL  COMPREHENSIVE METABOLIC PANEL     Status: Abnormal   Collection Time    02/17/14 12:04 AM      Result Value Ref Range   Sodium 137  137 - 147 mEq/L   Potassium 3.9  3.7 - 5.3 mEq/L   Chloride  98  96 - 112 mEq/L   CO2 27  19 - 32 mEq/L   Glucose, Bld 324 (*) 70 - 99 mg/dL   BUN 15  6 - 23 mg/dL   Creatinine, Ser 1.00  0.50 -  1.10 mg/dL   Calcium 10.2  8.4 - 10.5 mg/dL   Total Protein 7.6  6.0 - 8.3 g/dL   Albumin 3.5  3.5 - 5.2 g/dL   AST 18  0 - 37 U/L   ALT 13  0 - 35 U/L   Alkaline Phosphatase 94  39 - 117 U/L   Total Bilirubin <0.2 (*) 0.3 - 1.2 mg/dL   GFR calc non Af Amer 57 (*) >90 mL/min   GFR calc Af Amer 67 (*) >90 mL/min   Anion gap 12  5 - 15  TROPONIN I     Status: None   Collection Time    02/17/14 12:04 AM      Result Value Ref Range   Troponin I <0.30  <0.30 ng/mL  TROPONIN I     Status: None   Collection Time    02/17/14  3:05 AM      Result Value Ref Range   Troponin I <0.30  <0.30 ng/mL    Imaging Studies: Dg Chest 2 View  02/17/2014   CLINICAL DATA:  Mid chest pain for 3 days.  EXAM: CHEST  2 VIEW  COMPARISON:  12/28/2012  FINDINGS: The heart size and mediastinal contours are within normal limits. Both lungs are clear. The visualized skeletal structures are unremarkable.  IMPRESSION: No active cardiopulmonary disease.   Electronically Signed   By: Lucienne Capers M.D.   On: 02/17/2014 00:54   Pain appears to be in the chest wall and does not follow a pattern likely to be cardiac.    Wynetta Fines, MD 02/17/14 [phone]

## 2014-03-04 ENCOUNTER — Ambulatory Visit
Admission: RE | Admit: 2014-03-04 | Discharge: 2014-03-04 | Disposition: A | Discharge status: Home / Self Care | Payer: Medicare Other | Source: Ambulatory Visit | Attending: Neurology | Admitting: Neurology

## 2014-03-04 DIAGNOSIS — R269 Unspecified abnormalities of gait and mobility: Secondary | ICD-10-CM

## 2014-03-04 DIAGNOSIS — G40909 Epilepsy, unspecified, not intractable, without status epilepticus: Secondary | ICD-10-CM

## 2014-03-10 ENCOUNTER — Ambulatory Visit: Payer: Medicare Other | Admitting: Neurology

## 2014-03-14 ENCOUNTER — Encounter: Payer: Self-pay | Admitting: Neurology

## 2014-03-14 ENCOUNTER — Encounter (INDEPENDENT_AMBULATORY_CARE_PROVIDER_SITE_OTHER): Payer: Self-pay

## 2014-03-14 ENCOUNTER — Ambulatory Visit (INDEPENDENT_AMBULATORY_CARE_PROVIDER_SITE_OTHER): Payer: Medicare Other | Admitting: Neurology

## 2014-03-14 VITALS — BP 150/89 | HR 66 | Ht 61.0 in | Wt 236.0 lb

## 2014-03-14 DIAGNOSIS — G43009 Migraine without aura, not intractable, without status migrainosus: Secondary | ICD-10-CM

## 2014-03-14 DIAGNOSIS — G40909 Epilepsy, unspecified, not intractable, without status epilepticus: Secondary | ICD-10-CM

## 2014-03-14 DIAGNOSIS — G43709 Chronic migraine without aura, not intractable, without status migrainosus: Secondary | ICD-10-CM | POA: Insufficient documentation

## 2014-03-14 DIAGNOSIS — M545 Low back pain, unspecified: Secondary | ICD-10-CM

## 2014-03-14 DIAGNOSIS — R269 Unspecified abnormalities of gait and mobility: Secondary | ICD-10-CM

## 2014-03-14 MED ORDER — TRAMADOL HCL 50 MG PO TABS: 50.0000 mg | ORAL_TABLET | Freq: Four times a day (QID) | ORAL | Status: DC | PRN

## 2014-03-14 NOTE — Progress Notes (Signed)
PATIENT: [name] [name] DOB: [birth year]  HISTORICAL  Aaron Boeh  is a 68 year old right-handed African American female, accompanied by her daughter [name] [name] referred by her primary care physician [name] for evaluation of seizure, abnormal MRI of brain, she was recently evaluated by [facility] Neurologist. [name].  She had a history of seizure since age 46, generalized tonic-clonic seizure, no warning signs, initially she was treated with Dilantin and phenobarbital, recent 10 years, she had been treated with titrating dose of Topamax 100 +200 mg twice a day, Keppra 750 mg twice a day, she denies significant side effects.  Most recent seizure was in April 2015, she also had recent MRI of the brain, was told that she had small vessel disease, but per patient, there was no change in treatment plan,  She also complains of few years history of rapid onset gait difficulty, occasional urinary incontinence, she has past medical history of obesity, insulin-dependent diabetes, hypertension.  She has never driven, live at home with her husband,   we have reviewed MRI of the brain from March 2015, from [facility]ist, moderate periventricular white matter disease, no acute lesions  EEG was normal  Topamax level was 15.6, Keppra level was 8:6,  while taking Topamax 100 plus 200 mg twice a day, Keppra 750 mg twice a day, she is tolerating the medication well, there is no significant side effect.  UPDATE August 27th 2015: She returned for electrodiagnostic study today, there was no evidence of large fiber peripheral neuropathy,: Left lumbar radiculopathy,  I also reviewed MRI film with patient, and her daughter,  MRI cervical showed multilevel degenerative disc disease, with mild to moderate foraminal stenosis at C4-5, mild canal stenosis at C5-6, C4-5, C3-4 but no evidence of cord abnormalities  MRI lumbar spine (without) demonstrating:  1. At L4-5: severe facet arthropathy  with fluid in the facet joints with moderate right and moderate-severe left foraminal stenosis; anterior spondylolisthesis of L4 on L5 (110mm) with pseudo-disc bulging.  2. There is sacralization of L5 with transitional features, particularly on the left side.  3. Multi-level facet hypertrophy.  4. Compared to MRI on 06/19/11 there is progression of foraminal stenosis at L4-5.   Patient complains of progressive worsening gait difficulty over the past few years, bilateral feet paresthesia, especially left side, but she denies bone bladder incontinence  UPDATE 03/14/2014: She complains of left knee pain, leg pain,  she continued to have gait difficulty, MRI of thoracic spine showed no significant abnormality.    She also complains of frequent headaches, left retrorbital headaches, pounding headaches, 3 days a week, with associated light and noise sensitive, lasting for few hours, she did have migraine when she was younger. The headache is very similar to her migraine she had before,  REVIEW OF SYSTEMS: Full 14 system review of systems performed and notable only for gait difficulty, headaches, ALLERGIES: No Known Allergies  HOME MEDICATIONS: Current Outpatient Prescriptions on File Prior to Visit  Medication Sig Dispense Refill  . albuterol (PROVENTIL HFA;VENTOLIN HFA) 108 (90 BASE) MCG/ACT inhaler Inhale 2 puffs into the lungs every 6 (six) hours as needed for wheezing.      Marland Kitchen aspirin 81 MG chewable tablet Chew 81 mg by mouth daily.      . cholecalciferol (VITAMIN D) 1000 UNITS tablet Take 1 tablet (1,000 Units total) by mouth daily.      . dorzolamide (TRUSOPT) 2 % ophthalmic solution Place 1 drop into both eyes 3 (three) times daily.      Marland Kitchen  gabapentin (NEURONTIN) 300 MG capsule Take 300 mg by mouth 3 (three) times daily.      . hydrochlorothiazide (HYDRODIURIL) 25 MG tablet Take 25 mg by mouth daily.        . insulin glargine (LANTUS) 100 UNIT/ML injection Inject 0.3 mLs (30 Units total) into  the skin at bedtime.  10 mL  12  . ranitidine (ZANTAC) 150 MG tablet Take 150 mg by mouth 2 (two) times daily.        Marland Kitchen topiramate (TOPAMAX) 200 MG tablet Take 200 mg by mouth 2 (two) times daily.          PAST MEDICAL HISTORY: Past Medical History  Diagnosis Date  . Hypertension   . GERD (gastroesophageal reflux disease)   . Enlarged heart   . Chronic bronchitis     "get it q yr" (12/28/2012)  . OSA on CPAP   . Type II diabetes mellitus   . FIEPPIRJ(188.4)     "maybe 2/month" (12/28/2012)  . Grand mal seizure ~ 1962    "last sz was 08/2012; long time before that" (12/28/2012)  . Arthritis     "all over my body" (12/28/2012)  . Chronic lower back pain     PAST SURGICAL HISTORY: Past Surgical History  Procedure Laterality Date  . Abdominal hysterectomy  1970's  . Dilation and curettage of uterus  1970's  . Cataract extraction w/ intraocular lens  implant, bilateral Bilateral ~ 2012    FAMILY HISTORY: Family History  Problem Relation Age of Onset  . Diabetes type II Sister   . Coronary artery disease Sister     SOCIAL HISTORY:  History   Social History  . Marital Status: Married    Spouse Name: N/A    Number of Children: 1  . Years of Education: N/A   Occupational History  . Not working, not driving   Social History Main Topics  . Smoking status: Never Smoker   . Smokeless tobacco: Never Used  . Alcohol Use: No  . Drug Use: No  . Sexual Activity: No   Other Topics Concern  . Not on file   Social History Narrative  . No narrative on file    PHYSICAL EXAM   Filed Vitals:   03/14/14 0911  BP: 150/89  Pulse: 66  Height: 5\' 1"  (1.549 m)  Weight: 236 lb (107.049 kg)    Not recorded    Body mass index is 44.61 kg/(m^2).   Generalized: In no acute distress  Neck: Supple, no carotid bruits   Cardiac: Regular rate rhythm  Pulmonary: Clear to auscultation bilaterally  Musculoskeletal: No deformity  Neurological examination  Mentation: Alert  oriented to time, place, history taking, and causual conversation, obese, mild head titubation  Cranial nerve II-XII: Pupils were equal round reactive to light. Extraocular movements were full.  Visual field were full on confrontational test. Bilateral fundi were sharp.  Facial sensation and strength were normal. Hearing was intact to finger rubbing bilaterally. Uvula tongue midline.  Head turning and shoulder shrug and were normal and symmetric.Tongue protrusion into cheek strength was normal.  Motor: Normal tone, bulk and strength.  Sensory: Intact to fine touch, pinprick, preserved vibratory sensation, and proprioception at toes.  Coordination: Normal finger to nose, heel-to-shin bilaterally there was no truncal ataxia  Gait: wide based, cautious, unsteady,  Stiffness, bilateral knee valgrus Romberg signs: Negative  Deep tendon reflexes: Brachioradialis 2/2, biceps 2/2, triceps 2/2, patellar 3/3, Achilles 2/2, plantar responses were flexor bilaterally.  DIAGNOSTIC DATA (LABS, IMAGING, TESTING) - I reviewed patient records, labs, notes, testing and imaging myself where available.  Lab Results  Component Value Date   WBC 10.5 02/17/2014   HGB 13.7 02/17/2014   HCT 41.3 02/17/2014   MCV 87.9 02/17/2014   PLT 255 02/17/2014      Component Value Date/Time   NA 137 02/17/2014 0004   K 3.9 02/17/2014 0004   CL 98 02/17/2014 0004   CO2 27 02/17/2014 0004   GLUCOSE 324* 02/17/2014 0004   BUN 15 02/17/2014 0004   CREATININE 1.00 02/17/2014 0004   CALCIUM 10.2 02/17/2014 0004   PROT 7.6 02/17/2014 0004   ALBUMIN 3.5 02/17/2014 0004   AST 18 02/17/2014 0004   ALT 13 02/17/2014 0004   ALKPHOS 94 02/17/2014 0004   BILITOT <0.2* 02/17/2014 0004   GFRNONAA 57* 02/17/2014 0004   GFRAA 67* 02/17/2014 0004   Lab Results  Component Value Date   CHOL 157 12/28/2012   HDL 63 12/28/2012   LDLCALC 70 12/28/2012   TRIG 121 12/28/2012   CHOLHDL 2.5 12/28/2012   Lab Results  Component Value Date   HGBA1C 7.7*  12/28/2012   No results found for this basename: VITAMINB12   Lab Results  Component Value Date   TSH 1.222 12/28/2012    ASSESSMENT AND PLAN  Nikiya Starn is a 68 y.o. female with past medical history of hypertension, insulin-dependent diabetes, obesity, epilepsy, presenting with gradual onset gait difficulty, MRI of the brain showed moderate small vessel disease, MRI cervical, showed multilevel degenerative disc disease, mild canal stenosis at C3-4, C4-5, C5-6 level, but no significant cord signal changes. Multilevel lumbar degenerative disc disease, most severe at L4-5: severe facet arthropathy with fluid in the facet joints with moderate right and moderate-severe left foraminal stenosis; anterior spondylolisthesis of L4 on L5 (70mm) with pseudo-disc bulging. There was no significant abnormality at MRI of thoracic spine,  DDx for her gait difficulty including deconditioning, obesity, mild cervical myelopathy,  Home physical therapy  Her headache is most consistent with migraine, will proceed with preventive medications, Topamax 100 mg twice a day, Will add on tramadol as needed  Return to clinic with nurse practitioner in 3-4 months, Marcial Pacas, M.D. Ph.D.  [facility] [address], [place], [place] [address] [phone]

## 2014-05-19 ENCOUNTER — Encounter (HOSPITAL_COMMUNITY): Payer: Self-pay | Admitting: Cardiology

## 2014-06-14 ENCOUNTER — Ambulatory Visit (INDEPENDENT_AMBULATORY_CARE_PROVIDER_SITE_OTHER): Payer: Medicare Other | Admitting: Nurse Practitioner

## 2014-06-14 ENCOUNTER — Encounter: Payer: Self-pay | Admitting: Nurse Practitioner

## 2014-06-14 ENCOUNTER — Telehealth: Payer: Self-pay | Admitting: Neurology

## 2014-06-14 VITALS — BP 136/83 | HR 89 | Temp 98.5°F | Resp 20 | Ht 61.0 in | Wt 232.4 lb

## 2014-06-14 DIAGNOSIS — G43009 Migraine without aura, not intractable, without status migrainosus: Secondary | ICD-10-CM

## 2014-06-14 DIAGNOSIS — R269 Unspecified abnormalities of gait and mobility: Secondary | ICD-10-CM

## 2014-06-14 DIAGNOSIS — G40909 Epilepsy, unspecified, not intractable, without status epilepticus: Secondary | ICD-10-CM

## 2014-06-14 NOTE — Telephone Encounter (Signed)
PT order placed.

## 2014-06-14 NOTE — Progress Notes (Signed)
[facility]  PATIENT: [name] [name] DOB: 1946/01/02   REASON FOR VISIT: Follow-up for seizure disorder, gait abnormality, and migraines   HISTORY OF PRESENT ILLNESS: [name] [name] is a 69 year old right-handed African American female, accompanied by her daughter [name] [name] referred by her primary care physician [name] for evaluation of seizure, abnormal MRI of brain, she was recently evaluated by [name]. [name]. She had a history of seizure since age 26, generalized tonic-clonic seizure, no warning signs, initially she was treated with Dilantin and phenobarbital, recent 10 years, she had been treated with titrating dose of Topamax 100 +200 mg twice a day, Keppra 750 mg twice a day, she denies significant side effects. Most recent seizure was in April 2015, she also had recent MRI of the brain, was told that she had small vessel disease, but per patient, there was no change in treatment plan, She also complains of few years history of rapid onset gait difficulty, occasional urinary incontinence, she has past medical history of obesity, insulin-dependent diabetes, hypertension. She has never driven, live at home with her husband, we have reviewed MRI of the brain from March 2015, from [facility], moderate periventricular white matter disease, no acute lesions. EEG was normal Topamax level was 15.6, Keppra level was 8:6, while taking Topamax 100 plus 200 mg twice a day, Keppra 750 mg twice a day, she is tolerating the medication well, there is no significant side effect.  UPDATE August 27th 2015: She returned for electrodiagnostic study today, there was no evidence of large fiber peripheral neuropathy,: Left lumbar radiculopathy,I also reviewed MRI film with patient, and her daughter, MRI cervical showed multilevel degenerative disc disease, with mild to moderate foraminal stenosis at C4-5, mild canal stenosis at C5-6, C4-5, C3-4 but no  evidence of cord abnormalities MRI lumbar spine (without) demonstrating:  1. At L4-5: severe facet arthropathy with fluid in the facet joints with moderate right and moderate-severe left foraminal stenosis; anterior spondylolisthesis of L4 on L5 (39mm) with pseudo-disc bulging.  2. There is sacralization of L5 with transitional features, particularly on the left side.  3. Multi-level facet hypertrophy.  4. Compared to MRI on 06/19/11 there is progression of foraminal stenosis at L4-5.  Patient complains of progressive worsening gait difficulty over the past few years, bilateral feet paresthesia, especially left side, but she denies bone bladder incontinence  UPDATE 06/14/2014: [name], 69 year old female returns for follow-up. She continues to have gait difficulty, left knee pain leg pain. [name] last note says PT ordered however the referral was never placed. She denies any falls, occasionally uses a cane or walker. She denies any bowel or bladder incontinence. When last seen she was having frequent headaches almost daily and with the Topamax her headaches are down to less than once weekly. Her headaches occur left retro-orbital orbital pounding associated with light and noise sensitivity lasting a few hours. She also has a history of seizure disorder currently on Keppra 750 twice a day. Last seizure activity was April 2015. She returns for reevaluation    REVIEW OF SYSTEMS: Full 14 system review of systems performed and notable only for those listed, all others are neg:  Constitutional: N/A  Cardiovascular: Leg swelling Ear/Nose/Throat: N/A  Skin: N/A  Eyes: N/A  Respiratory: N/A  Gastroitestinal: N/A  Hematology/Lymphatic: N/A  Endocrine: N/A Musculoskeletal: Joint pain, joint swelling, muscle cramps, walking difficulty Allergy/Immunology: N/A  Neurological: Seizure disorder, migraines Psychiatric: N/A Sleep : NA   ALLERGIES: No Known Allergies  HOME MEDICATIONS: Outpatient  Prescriptions Prior to Visit  Medication Sig Dispense Refill  . albuterol (PROVENTIL HFA;VENTOLIN HFA) 108 (90 BASE) MCG/ACT inhaler Inhale 2 puffs into the lungs every 6 (six) hours as needed for wheezing.    Marland Kitchen aspirin 81 MG chewable tablet Chew 81 mg by mouth daily.    . cholecalciferol (VITAMIN D) 1000 UNITS tablet Take 1 tablet (1,000 Units total) by mouth daily.    . dorzolamide (TRUSOPT) 2 % ophthalmic solution Place 1 drop into both eyes 3 (three) times daily.    Marland Kitchen gabapentin (NEURONTIN) 300 MG capsule Take 300 mg by mouth 3 (three) times daily.    . hydrochlorothiazide (HYDRODIURIL) 25 MG tablet Take 25 mg by mouth daily.      Marland Kitchen HYDROcodone-acetaminophen (NORCO) 5-325 MG per tablet Take 1-2 tablets by mouth every 6 (six) hours as needed (for pain). 10 tablet 0  . insulin glargine (LANTUS) 100 UNIT/ML injection Inject 0.3 mLs (30 Units total) into the skin at bedtime. 10 mL 12  . levETIRAcetam (KEPPRA) 750 MG tablet Take 750 mg by mouth 2 (two) times daily.    Marland Kitchen losartan (COZAAR) 50 MG tablet Take 50 mg by mouth daily.    . ranitidine (ZANTAC) 150 MG tablet Take 150 mg by mouth 2 (two) times daily.      Marland Kitchen topiramate (TOPAMAX) 200 MG tablet Take 100 mg by mouth 2 (two) times daily.     . traMADol (ULTRAM) 50 MG tablet Take 1 tablet (50 mg total) by mouth every 6 (six) hours as needed. 45 tablet 5   No facility-administered medications prior to visit.    PAST MEDICAL HISTORY: Past Medical History  Diagnosis Date  . Hypertension   . GERD (gastroesophageal reflux disease)   . Enlarged heart   . Chronic bronchitis     "get it q yr" (12/28/2012)  . OSA on CPAP   . Type II diabetes mellitus   . VOZDGUYQ(034.7)     "maybe 2/month" (12/28/2012)  . Grand mal seizure ~ 1962    "last sz was 08/2012; long time before that" (12/28/2012)  . Arthritis     "all over my body" (12/28/2012)  . Chronic lower back pain     PAST SURGICAL HISTORY: Past Surgical History  Procedure Laterality Date    . Abdominal hysterectomy  1970's  . Dilation and curettage of uterus  1970's  . Cataract extraction w/ intraocular lens  implant, bilateral Bilateral ~ 2012  . Left heart catheterization with coronary angiogram N/A 03/02/2013    Procedure: LEFT HEART CATHETERIZATION WITH CORONARY ANGIOGRAM;  Surgeon: Laverda Page, MD;  Location: [facility] CATH LAB;  Service: Cardiovascular;  Laterality: N/A;    FAMILY HISTORY: Family History  Problem Relation Age of Onset  . Diabetes type II Sister   . Coronary artery disease Sister     SOCIAL HISTORY: History   Social History  . Marital Status: Married    Spouse Name: N/A    Number of Children: N/A  . Years of Education: N/A   Occupational History  . Not on file.   Social History Main Topics  . Smoking status: Never Smoker   . Smokeless tobacco: Never Used  . Alcohol Use: No  . Drug Use: No  . Sexual Activity: No   Other Topics Concern  . Not on file   Social History Narrative     PHYSICAL EXAM  Filed Vitals:   06/14/14 0856  BP: 136/83  Pulse: 89  Temp: 98.5 F (36.9 C)  TempSrc: Oral  Resp: 20  Height: 5\' 1"  (1.549 m)  Weight: 232 lb 6.4 oz (105.416 kg)   Body mass index is 43.93 kg/(m^2).  Generalized: Well developed, obese female in no acute distress  Head: normocephalic and atraumatic,. Oropharynx benign  Neck: Supple, no carotid bruits  Cardiac: Regular rate rhythm, no murmur  Musculoskeletal: No deformity   Neurological examination   Mentation: Alert oriented to time, place, history taking. Follows all commands speech and language fluent Cranial nerve II-XII: Fundoscopic exam reveals sharp disc margins.Pupils were equal round reactive to light extraocular movements were full, visual field were full on confrontational test. Facial sensation and strength were normal. hearing was intact to finger rubbing bilaterally. Uvula tongue midline. head turning and shoulder shrug were normal and symmetric.Tongue protrusion  into cheek strength was normal. Motor: normal bulk and tone, full strength in the BUE, BLE, fine finger movements normal, no pronator drift. No focal weakness Sensory: normal and symmetric to light touch, pinprick, and  vibration  Coordination: finger-nose-finger, heel-to-shin bilaterally, no dysmetria Gait: wide based, cautious, unsteady, Stiffness, bilateral knee valgrus Romberg signs: Negative Deep tendon reflexes: Brachioradialis 2/2, biceps 2/2, triceps 2/2, patellar 3/3, Achilles 2/2, plantar responses were flexor bilaterally. DIAGNOSTIC DATA (LABS, IMAGING, TESTING) - I reviewed patient records, labs, notes, testing and imaging myself where available.  Lab Results  Component Value Date   WBC 10.5 02/17/2014   HGB 13.7 02/17/2014   HCT 41.3 02/17/2014   MCV 87.9 02/17/2014   PLT 255 02/17/2014      Component Value Date/Time   NA 137 02/17/2014 0004   K 3.9 02/17/2014 0004   CL 98 02/17/2014 0004   CO2 27 02/17/2014 0004   GLUCOSE 324* 02/17/2014 0004   BUN 15 02/17/2014 0004   CREATININE 1.00 02/17/2014 0004   CALCIUM 10.2 02/17/2014 0004   PROT 7.6 02/17/2014 0004   ALBUMIN 3.5 02/17/2014 0004   AST 18 02/17/2014 0004   ALT 13 02/17/2014 0004   ALKPHOS 94 02/17/2014 0004   BILITOT <0.2* 02/17/2014 0004   GFRNONAA 36* 02/17/2014 0004   GFRAA 49* 02/17/2014 0004   ASSESSMENT AND PLAN  69 y.o. year old female  has a past medical history of Hypertension;  Enlarged heart; Chronic bronchitis; OSA on CPAP; Type II diabetes mellitus; migraines  Grand mal seizure (~ 1962); Arthritis; and Chronic lower back pain. and gait abnormality here to follow-up.  [facility] with [facility] for gait abnormality, deconditioning unsteady gait, fall risk Continue Keppra at current dose call for any seizure activity Continue Topamax at current dose Follow-up in 4 months Dennie Bible, [facility], [facility], APRN  [facility] [address], [place], [place]  [address] [phone]

## 2014-06-14 NOTE — Patient Instructions (Signed)
We will order home care with [facility] for gait abnormality, deconditioning Continue Keppra at current dose call for any seizure activity Continue Topamax at current dose Follow-up in 4 months

## 2014-06-17 NOTE — Progress Notes (Addendum)
Chart reviewed, agree above plan, I also entered home health face-to-face consultation for home physical therapy.

## 2014-07-13 ENCOUNTER — Other Ambulatory Visit: Payer: Self-pay | Admitting: Neurology

## 2014-07-19 ENCOUNTER — Telehealth: Payer: Self-pay | Admitting: Neurology

## 2014-07-19 ENCOUNTER — Telehealth: Payer: Self-pay

## 2014-07-19 NOTE — Telephone Encounter (Signed)
Faxed face to Face to [facility] at [facility]. [phone]

## 2014-07-19 NOTE — Addendum Note (Signed)
Addended by: Marcial Pacas on: 07/19/2014 03:01 PM   Modules accepted: Orders

## 2014-07-21 NOTE — Telephone Encounter (Signed)
Reentered order for PT.

## 2014-08-10 DIAGNOSIS — R269 Unspecified abnormalities of gait and mobility: Secondary | ICD-10-CM | POA: Diagnosis not present

## 2014-09-20 ENCOUNTER — Ambulatory Visit (INDEPENDENT_AMBULATORY_CARE_PROVIDER_SITE_OTHER): Payer: Medicare Other | Admitting: Nurse Practitioner

## 2014-09-20 ENCOUNTER — Encounter: Payer: Self-pay | Admitting: Nurse Practitioner

## 2014-09-20 VITALS — BP 128/81 | HR 90 | Ht 61.0 in | Wt 235.8 lb

## 2014-09-20 DIAGNOSIS — G40909 Epilepsy, unspecified, not intractable, without status epilepticus: Secondary | ICD-10-CM | POA: Diagnosis not present

## 2014-09-20 DIAGNOSIS — G43009 Migraine without aura, not intractable, without status migrainosus: Secondary | ICD-10-CM | POA: Diagnosis not present

## 2014-09-20 DIAGNOSIS — G40409 Other generalized epilepsy and epileptic syndromes, not intractable, without status epilepticus: Secondary | ICD-10-CM

## 2014-09-20 DIAGNOSIS — R269 Unspecified abnormalities of gait and mobility: Secondary | ICD-10-CM | POA: Diagnosis not present

## 2014-09-20 MED ORDER — LEVETIRACETAM 750 MG PO TABS: 750.0000 mg | ORAL_TABLET | Freq: Two times a day (BID) | ORAL | Status: DC

## 2014-09-20 MED ORDER — TOPIRAMATE 200 MG PO TABS: 100.0000 mg | ORAL_TABLET | Freq: Two times a day (BID) | ORAL | Status: DC

## 2014-09-20 NOTE — Patient Instructions (Signed)
Continue home exercise program at least daily Continue Keppra at current dose will refill through mail order Call for seizure activity Continue Topamax at current dose will refill through mail order Follow-up in 6 months

## 2014-09-20 NOTE — Progress Notes (Signed)
[facility]  PATIENT: [name] [name] DOB: 05/03/1946   REASON FOR VISIT: Follow-up for seizure disorder, gait abnormality and migraine HISTORY FROM:patient    HISTORY OF PRESENT ILLNESS:[name] [name] is a 69 year old right-handed African American female, accompanied by her daughter [name] [name] referred by her primary care physician [name] for evaluation of seizure, abnormal MRI of brain, she was recently evaluated by [facility] Neurologist. [name]. She had a history of seizure since age 45, generalized tonic-clonic seizure, no warning signs, initially she was treated with Dilantin and phenobarbital, recent 10 years, she had been treated with titrating dose of Topamax 100 +200 mg twice a day, Keppra 750 mg twice a day, she denies significant side effects. Most recent seizure was in April 2015, she also had recent MRI of the brain, was told that she had small vessel disease, but per patient, there was no change in treatment plan, She also complains of few years history of rapid onset gait difficulty, occasional urinary incontinence, she has past medical history of obesity, insulin-dependent diabetes, hypertension. She has never driven, live at home with her husband, we have reviewed MRI of the brain from March 2015, from [facility]ist, moderate periventricular white matter disease, no acute lesions. EEG was normal Topamax level was 15.6, Keppra level was 8:6, while taking Topamax 100 plus 200 mg twice a day, Keppra 750 mg twice a day, she is tolerating the medication well, there is no significant side effect.  UPDATE August 27th 2015: She returned for electrodiagnostic study today, there was no evidence of large fiber peripheral neuropathy,: Left lumbar radiculopathy,I also reviewed MRI film with patient, and her daughter, MRI cervical showed multilevel degenerative disc disease, with mild to moderate foraminal stenosis at C4-5, mild canal stenosis at C5-6,  C4-5, C3-4 but no evidence of cord abnormalities MRI lumbar spine (without) demonstrating:  1. At L4-5: severe facet arthropathy with fluid in the facet joints with moderate right and moderate-severe left foraminal stenosis; anterior spondylolisthesis of L4 on L5 (57mm) with pseudo-disc bulging.  2. There is sacralization of L5 with transitional features, particularly on the left side.  3. Multi-level facet hypertrophy.  4. Compared to MRI on 06/19/11 there is progression of foraminal stenosis at L4-5.  Patient complains of progressive worsening gait difficulty over the past few years, bilateral feet paresthesia, especially left side, but she denies bone bladder incontinence  UPDATE 09/20/2014: [name] [name], 69 year old female returns for follow-up. Since last seen she had PT for  gait difficulty, left knee pain leg pain. She claims this was beneficial and she continues HEP.  She denies any falls, occasionally uses a cane or walker. She denies any bowel or bladder incontinence. She has history of migraine and  Topamax has decreased her headaches  down to less than once weekly. Her headaches occur left retro-orbital orbital pounding associated with light and noise sensitivity lasting a few hours. She also has a history of seizure disorder currently on Keppra 750 twice a day. Last seizure activity was April 2015. She returns for reevaluation. She needs refills on medications.     REVIEW OF SYSTEMS: Full 14 system review of systems performed and notable only for those listed, all others are neg:  Constitutional: neg  Cardiovascular: leg swelling Ear/Nose/Throat: neg  Skin: neg Eyes: neg Respiratory: neg Gastroitestinal: neg  Hematology/Lymphatic: neg  Endocrine: neg Musculoskeletal:neg Allergy/Immunology: neg Neurological: neg Psychiatric: neg Sleep : neg   ALLERGIES: No Known Allergies  HOME MEDICATIONS: Outpatient Prescriptions Prior to Visit  Medication Sig Dispense Refill  .  albuterol (PROVENTIL HFA;VENTOLIN HFA) 108 (90 BASE) MCG/ACT inhaler Inhale 2 puffs into the lungs every 6 (six) hours as needed for wheezing.    Marland Kitchen aspirin 81 MG chewable tablet Chew 81 mg by mouth daily.    . cholecalciferol (VITAMIN D) 1000 UNITS tablet Take 1 tablet (1,000 Units total) by mouth daily.    . dorzolamide (TRUSOPT) 2 % ophthalmic solution Place 1 drop into both eyes 3 (three) times daily.    Marland Kitchen gabapentin (NEURONTIN) 300 MG capsule Take 300 mg by mouth 3 (three) times daily.    . hydrochlorothiazide (HYDRODIURIL) 25 MG tablet Take 25 mg by mouth daily.      . insulin glargine (LANTUS) 100 UNIT/ML injection Inject 0.3 mLs (30 Units total) into the skin at bedtime. (Patient taking differently: Inject 60 Units into the skin at bedtime. ) 10 mL 12  . levETIRAcetam (KEPPRA) 750 MG tablet Take 750 mg by mouth 2 (two) times daily.    Marland Kitchen losartan (COZAAR) 50 MG tablet Take 50 mg by mouth daily.    . ranitidine (ZANTAC) 150 MG tablet Take 150 mg by mouth 2 (two) times daily.      Marland Kitchen topiramate (TOPAMAX) 200 MG tablet Take 100 mg by mouth 2 (two) times daily.     . traMADol (ULTRAM) 50 MG tablet Take 1 tablet (50 mg total) by mouth every 6 (six) hours as needed. 45 tablet 5  . HYDROcodone-acetaminophen (NORCO) 5-325 MG per tablet Take 1-2 tablets by mouth every 6 (six) hours as needed (for pain). (Patient not taking: Reported on 09/20/2014) 10 tablet 0   No facility-administered medications prior to visit.    PAST MEDICAL HISTORY: Past Medical History  Diagnosis Date  . Hypertension   . GERD (gastroesophageal reflux disease)   . Enlarged heart   . Chronic bronchitis     "get it q yr" (12/28/2012)  . OSA on CPAP   . Type II diabetes mellitus   . IOXBDZHG(992.4)     "maybe 2/month" (12/28/2012)  . Grand mal seizure ~ 1962    "last sz was 08/2012; long time before that" (12/28/2012)  . Arthritis     "all over my body" (12/28/2012)  . Chronic lower back pain     PAST SURGICAL  HISTORY: Past Surgical History  Procedure Laterality Date  . Abdominal hysterectomy  1970's  . Dilation and curettage of uterus  1970's  . Cataract extraction w/ intraocular lens  implant, bilateral Bilateral ~ 2012  . Left heart catheterization with coronary angiogram N/A 03/02/2013    Procedure: LEFT HEART CATHETERIZATION WITH CORONARY ANGIOGRAM;  Surgeon: Laverda Page, MD;  Location: [facility] CATH LAB;  Service: Cardiovascular;  Laterality: N/A;    FAMILY HISTORY: Family History  Problem Relation Age of Onset  . Diabetes type II Sister   . Coronary artery disease Sister     SOCIAL HISTORY: History   Social History  . Marital Status: Married    Spouse Name: N/A  . Number of Children: N/A  . Years of Education: N/A   Occupational History  . Not on file.   Social History Main Topics  . Smoking status: Never Smoker   . Smokeless tobacco: Never Used  . Alcohol Use: No  . Drug Use: No  . Sexual Activity: No   Other Topics Concern  . Not on file   Social History Narrative     PHYSICAL EXAM  Filed Vitals:   09/20/14  0846  BP: 128/81  Pulse: 90  Height: 5\' 1"  (1.549 m)  Weight: 235 lb 12.8 oz (106.958 kg)   Body mass index is 44.58 kg/(m^2). Generalized: Well developed, obese female in no acute distress  Head: normocephalic and atraumatic,. Oropharynx benign  Neck: Supple, no carotid bruits  Cardiac: Regular rate rhythm, no murmur  Musculoskeletal: No deformity   Neurological examination   Mentation: Alert oriented to time, place, history taking. Follows all commands speech and language fluent Cranial nerve II-XII: Pupils were equal round reactive to light extraocular movements were full, visual field were full on confrontational test. Facial sensation and strength were normal. hearing was intact to finger rubbing bilaterally. Uvula tongue midline. head turning and shoulder shrug were normal and symmetric.Tongue protrusion into cheek strength was  normal. Motor: normal bulk and tone, full strength in the BUE, BLE, fine finger movements normal, no pronator drift. No focal weakness Sensory: normal and symmetric to light touch, pinprick, and vibration  Coordination: finger-nose-finger, heel-to-shin bilaterally, no dysmetria Gait: wide based, cautious, steady gait,  bilateral knee valgus, no assistive device Reflexes: Brachioradialis 2/2, biceps 2/2, triceps 2/2, patellar 3/3, Achilles 2/2, plantar responses were flexor bilaterally.   DIAGNOSTIC DATA (LABS, IMAGING, TESTING) - I reviewed patient records, labs, notes, testing and imaging myself where available.  Lab Results  Component Value Date   WBC 10.5 02/17/2014   HGB 13.7 02/17/2014   HCT 41.3 02/17/2014   MCV 87.9 02/17/2014   PLT 255 02/17/2014      Component Value Date/Time   NA 137 02/17/2014 0004   K 3.9 02/17/2014 0004   CL 98 02/17/2014 0004   CO2 27 02/17/2014 0004   GLUCOSE 324* 02/17/2014 0004   BUN 15 02/17/2014 0004   CREATININE 1.00 02/17/2014 0004   CALCIUM 10.2 02/17/2014 0004   PROT 7.6 02/17/2014 0004   ALBUMIN 3.5 02/17/2014 0004   AST 18 02/17/2014 0004   ALT 13 02/17/2014 0004   ALKPHOS 94 02/17/2014 0004   BILITOT <0.2* 02/17/2014 0004   GFRNONAA 69* 02/17/2014 0004   GFRAA 63* 02/17/2014 0004       ASSESSMENT AND PLAN  69 y.o. year old female  has a past medical history of Hypertension;  Enlarged heart; Chronic bronchitis; OSA on CPAP; Type II diabetes mellitus; Headache(784.0); Grand mal seizure (~ 1962); Arthritis; and Chronic lower back pain and gait disorder here to follow up.   Continue home exercise program at least daily for gait disorder, improved flexibility Continue Keppra at current dose for seizure disorder  will refill through mail order Call for seizure activity Continue Topamax at current dose for migraines will refill through mail order Call for increase in headaches Follow-up in 6 months Dennie Bible, [facility],  [facility], [facility] [address] [place], [place] [address] [phone]

## 2014-09-23 NOTE — Progress Notes (Addendum)
I have reviewed and agreed above plan.  No orders of the defined types were placed in this encounter.    New Prescriptions   No medications on file    Medications Discontinued During This Encounter  Medication Reason  . levETIRAcetam (KEPPRA) 750 MG tablet Reorder  . topiramate (TOPAMAX) 200 MG tablet Reorder    Return in about 6 months (around 03/22/2015).

## 2014-11-10 ENCOUNTER — Telehealth: Payer: Self-pay | Admitting: Neurology

## 2014-11-10 NOTE — Telephone Encounter (Signed)
Patient called stating she is confused regarding  topiramate (TOPAMAX) 200 MG tablet. She called pharmacy for refill and she was told she no longer takes topamax 100mg  that the NP discontinued this dose. She was instructed to take topamax 200 and break it in half and take 1 in am and 1 in pm. She also states the  levETIRAcetam (KEPPRA) 750 MG tablet the pharmacy told her the directions were to take 1 tablet 3xday. Please call and advise. Patient can be reached at [phone].

## 2014-11-10 NOTE — Telephone Encounter (Signed)
Clarified dosages with patient.  I ask her to read the bottles she received today and the instructions on the bottle were correct.  She misunderstood what the pharmacy told her.

## 2014-11-21 ENCOUNTER — Encounter: Payer: Self-pay | Admitting: *Deleted

## 2014-11-21 MED ORDER — TOPIRAMATE 200 MG PO TABS
200.0000 mg | ORAL_TABLET | Freq: Two times a day (BID) | ORAL | Status: DC
Start: 2014-11-21 — End: 2015-03-23

## 2014-11-21 NOTE — Telephone Encounter (Signed)
Patient called again and stated that she is confused about her Rx. TOPAMAX. She does not understand why the dosage has been changed. She states that she did not talk to [employer] last week. She would like for the nurse to call her back and explain why her dosage is changing from 400 mg a day to 200 mg a day. Patient states that she has an appt today and request that she be called after 3:00 today. Please call and advise.

## 2014-11-21 NOTE — Telephone Encounter (Signed)
Spoke to patient - says she has been taking Topamax 200mg , one tab BID and would like to continue that dosage.  Spoke to Dr. Krista Blue who gave verbal orders to send in a new prescription at this dosage.  New rx sent to [facility].

## 2014-11-21 NOTE — Telephone Encounter (Signed)
Patient called again and requested to speak with Clifton Custard. Please call and advise.

## 2014-11-21 NOTE — Addendum Note (Signed)
Addended by: Desmond Lope on: 11/21/2014 05:26 PM   Modules accepted: Orders

## 2014-12-21 ENCOUNTER — Telehealth: Payer: Self-pay | Admitting: *Deleted

## 2014-12-21 ENCOUNTER — Other Ambulatory Visit: Payer: Self-pay | Admitting: Neurology

## 2014-12-21 NOTE — Telephone Encounter (Signed)
Rx for Tramadol faxed and confirmed to [facility] on [address] in [place] [phone]).

## 2015-01-10 ENCOUNTER — Other Ambulatory Visit: Payer: Self-pay | Admitting: Neurology

## 2015-01-10 NOTE — Telephone Encounter (Signed)
Per OV note from 10/05

## 2015-01-27 DIAGNOSIS — I10 Essential (primary) hypertension: Secondary | ICD-10-CM | POA: Diagnosis not present

## 2015-01-27 DIAGNOSIS — G43909 Migraine, unspecified, not intractable, without status migrainosus: Secondary | ICD-10-CM | POA: Diagnosis not present

## 2015-01-27 DIAGNOSIS — J45909 Unspecified asthma, uncomplicated: Secondary | ICD-10-CM | POA: Diagnosis not present

## 2015-01-27 DIAGNOSIS — E119 Type 2 diabetes mellitus without complications: Secondary | ICD-10-CM | POA: Diagnosis not present

## 2015-01-27 DIAGNOSIS — E1140 Type 2 diabetes mellitus with diabetic neuropathy, unspecified: Secondary | ICD-10-CM | POA: Diagnosis not present

## 2015-02-10 DIAGNOSIS — Z961 Presence of intraocular lens: Secondary | ICD-10-CM | POA: Diagnosis not present

## 2015-02-10 DIAGNOSIS — H4011X4 Primary open-angle glaucoma, indeterminate stage: Secondary | ICD-10-CM | POA: Diagnosis not present

## 2015-02-10 DIAGNOSIS — E11329 Type 2 diabetes mellitus with mild nonproliferative diabetic retinopathy without macular edema: Secondary | ICD-10-CM | POA: Diagnosis not present

## 2015-02-10 DIAGNOSIS — Z794 Long term (current) use of insulin: Secondary | ICD-10-CM | POA: Diagnosis not present

## 2015-02-10 DIAGNOSIS — G4733 Obstructive sleep apnea (adult) (pediatric): Secondary | ICD-10-CM | POA: Diagnosis not present

## 2015-02-10 DIAGNOSIS — H4011X0 Primary open-angle glaucoma, stage unspecified: Secondary | ICD-10-CM | POA: Diagnosis not present

## 2015-02-10 DIAGNOSIS — E11339 Type 2 diabetes mellitus with moderate nonproliferative diabetic retinopathy without macular edema: Secondary | ICD-10-CM | POA: Diagnosis not present

## 2015-02-10 DIAGNOSIS — Z7982 Long term (current) use of aspirin: Secondary | ICD-10-CM | POA: Diagnosis not present

## 2015-02-10 DIAGNOSIS — H0259 Other disorders affecting eyelid function: Secondary | ICD-10-CM | POA: Diagnosis not present

## 2015-02-14 DIAGNOSIS — R6889 Other general symptoms and signs: Secondary | ICD-10-CM | POA: Diagnosis not present

## 2015-02-14 DIAGNOSIS — E559 Vitamin D deficiency, unspecified: Secondary | ICD-10-CM | POA: Diagnosis not present

## 2015-02-14 DIAGNOSIS — E1165 Type 2 diabetes mellitus with hyperglycemia: Secondary | ICD-10-CM | POA: Diagnosis not present

## 2015-02-17 DIAGNOSIS — J45909 Unspecified asthma, uncomplicated: Secondary | ICD-10-CM | POA: Diagnosis not present

## 2015-02-17 DIAGNOSIS — Z7689 Persons encountering health services in other specified circumstances: Secondary | ICD-10-CM | POA: Diagnosis not present

## 2015-02-17 DIAGNOSIS — I10 Essential (primary) hypertension: Secondary | ICD-10-CM | POA: Diagnosis not present

## 2015-02-17 DIAGNOSIS — E119 Type 2 diabetes mellitus without complications: Secondary | ICD-10-CM | POA: Diagnosis not present

## 2015-02-20 DIAGNOSIS — E1165 Type 2 diabetes mellitus with hyperglycemia: Secondary | ICD-10-CM | POA: Diagnosis not present

## 2015-02-20 DIAGNOSIS — I10 Essential (primary) hypertension: Secondary | ICD-10-CM | POA: Diagnosis not present

## 2015-02-20 DIAGNOSIS — G603 Idiopathic progressive neuropathy: Secondary | ICD-10-CM | POA: Diagnosis not present

## 2015-02-20 DIAGNOSIS — E559 Vitamin D deficiency, unspecified: Secondary | ICD-10-CM | POA: Diagnosis not present

## 2015-03-06 DIAGNOSIS — Z0100 Encounter for examination of eyes and vision without abnormal findings: Secondary | ICD-10-CM | POA: Diagnosis not present

## 2015-03-06 DIAGNOSIS — E1140 Type 2 diabetes mellitus with diabetic neuropathy, unspecified: Secondary | ICD-10-CM | POA: Diagnosis not present

## 2015-03-06 DIAGNOSIS — Z0000 Encounter for general adult medical examination without abnormal findings: Secondary | ICD-10-CM | POA: Diagnosis not present

## 2015-03-06 DIAGNOSIS — E119 Type 2 diabetes mellitus without complications: Secondary | ICD-10-CM | POA: Diagnosis not present

## 2015-03-06 DIAGNOSIS — I10 Essential (primary) hypertension: Secondary | ICD-10-CM | POA: Diagnosis not present

## 2015-03-16 DIAGNOSIS — E1140 Type 2 diabetes mellitus with diabetic neuropathy, unspecified: Secondary | ICD-10-CM | POA: Diagnosis not present

## 2015-03-16 DIAGNOSIS — G43909 Migraine, unspecified, not intractable, without status migrainosus: Secondary | ICD-10-CM | POA: Diagnosis not present

## 2015-03-16 DIAGNOSIS — E119 Type 2 diabetes mellitus without complications: Secondary | ICD-10-CM | POA: Diagnosis not present

## 2015-03-16 DIAGNOSIS — D179 Benign lipomatous neoplasm, unspecified: Secondary | ICD-10-CM | POA: Diagnosis not present

## 2015-03-16 DIAGNOSIS — I10 Essential (primary) hypertension: Secondary | ICD-10-CM | POA: Diagnosis not present

## 2015-03-23 ENCOUNTER — Ambulatory Visit (INDEPENDENT_AMBULATORY_CARE_PROVIDER_SITE_OTHER): Payer: Medicare Other | Admitting: Nurse Practitioner

## 2015-03-23 ENCOUNTER — Encounter: Payer: Self-pay | Admitting: Nurse Practitioner

## 2015-03-23 VITALS — BP 135/89 | HR 80 | Ht 61.0 in | Wt 233.4 lb

## 2015-03-23 DIAGNOSIS — M545 Low back pain, unspecified: Secondary | ICD-10-CM

## 2015-03-23 DIAGNOSIS — G40409 Other generalized epilepsy and epileptic syndromes, not intractable, without status epilepticus: Secondary | ICD-10-CM | POA: Diagnosis not present

## 2015-03-23 DIAGNOSIS — G43009 Migraine without aura, not intractable, without status migrainosus: Secondary | ICD-10-CM

## 2015-03-23 DIAGNOSIS — R269 Unspecified abnormalities of gait and mobility: Secondary | ICD-10-CM | POA: Diagnosis not present

## 2015-03-23 MED ORDER — LEVETIRACETAM 750 MG PO TABS: 750.0000 mg | ORAL_TABLET | Freq: Two times a day (BID) | ORAL | Status: DC

## 2015-03-23 MED ORDER — TOPIRAMATE 200 MG PO TABS: 200.0000 mg | ORAL_TABLET | Freq: Two times a day (BID) | ORAL | Status: DC

## 2015-03-23 NOTE — Patient Instructions (Signed)
Continue home exercise program at least daily for gait disorder, improved flexibility Continue Keppra at current dose for seizure disorder will refill through mail order Call for seizure activity Continue Topamax at current dose for migraines will refill through mail order Call for increase in headaches Continue Ultram when necessary acute headache Follow-up in 6 months

## 2015-03-23 NOTE — Progress Notes (Signed)
[facility]  PATIENT: [name] [name] DOB: [birth year]   REASON FOR VISIT: Follow-up for seizure disorder, migraine headaches back pain and abnormality of gait HISTORY FROM: Patient    HISTORY OF PRESENT ILLNESS: HISTORY:[name] [name] is a 69 year old right-handed African American female, accompanied by her daughter [name] [name] referred by her primary care physician [name] for evaluation of seizure, abnormal MRI of brain, she was recently evaluated by [facility] Neurologist. [name]. She had a history of seizure since age 89, generalized tonic-clonic seizure, no warning signs, initially she was treated with Dilantin and phenobarbital, recent 10 years, she had been treated with titrating dose of Topamax 100 +200 mg twice a day, Keppra 750 mg twice a day, she denies significant side effects. Most recent seizure was in April 2015, she also had recent MRI of the brain, was told that she had small vessel disease, but per patient, there was no change in treatment plan, She also complains of few years history of rapid onset gait difficulty, occasional urinary incontinence, she has past medical history of obesity, insulin-dependent diabetes, hypertension. She has never driven, live at home with her husband, we have reviewed MRI of the brain from March 2015, from [facility]ist, moderate periventricular white matter disease, no acute lesions. EEG was normal Topamax level was 15.6, Keppra level was 8:6, while taking Topamax 100 plus 200 mg twice a day, Keppra 750 mg twice a day, she is tolerating the medication well, there is no significant side effect.  UPDATE 10//13/2016: [name] [name], 69 year old female returns for follow-up. Since last seen her  left knee pain leg pain is better. PT was beneficial and she continues HEP. She denies any falls, occasionally uses a cane or walker. She denies any bowel or bladder incontinence. She has history of migraine and Topamax has  decreased her headaches down to less than once weekly but sometimes she may have five headaches/month. Her headaches occur left retro-orbital orbital pounding associated with light and noise sensitivity lasting a few hours. She also has a history of seizure disorder currently on Keppra 750 twice a day. Last seizure activity was April 2015. She also has a history of low back pain. MRI lumbar spine 01/19/14 with  progression of foraminal stenosis at L4-5. She returns for refills and reevaluation   REVIEW OF SYSTEMS: Full 14 system review of systems performed and notable only for those listed, all others are neg:  Constitutional: neg  Cardiovascular: Leg swelling Ear/Nose/Throat: neg  Skin: neg Eyes: neg Respiratory: neg Gastroitestinal: neg  Hematology/Lymphatic: neg  Endocrine: neg Musculoskeletal: Joint pain aching muscles walking difficulty Allergy/Immunology: neg Neurological: History of seizure activity, history of migraines  Psychiatric: neg Sleep : Daytime sleepiness   ALLERGIES: No Known Allergies  HOME MEDICATIONS: Outpatient Prescriptions Prior to Visit  Medication Sig Dispense Refill  . albuterol (PROVENTIL HFA;VENTOLIN HFA) 108 (90 BASE) MCG/ACT inhaler Inhale 2 puffs into the lungs every 6 (six) hours as needed for wheezing.    Marland Kitchen aspirin 81 MG chewable tablet Chew 81 mg by mouth daily.    . cholecalciferol (VITAMIN D) 1000 UNITS tablet Take 1 tablet (1,000 Units total) by mouth daily.    . dorzolamide (TRUSOPT) 2 % ophthalmic solution Place 1 drop into both eyes 3 (three) times daily.    Marland Kitchen gabapentin (NEURONTIN) 300 MG capsule Take 300 mg by mouth 3 (three) times daily.    Marland Kitchen HUMALOG MIX 75/25 (75-25) 100 UNIT/ML SUSP injection Inject 30 Units into the skin 2 (two)  times daily.    . hydrochlorothiazide (HYDRODIURIL) 25 MG tablet Take 25 mg by mouth daily.      Marland Kitchen HYDROcodone-acetaminophen (NORCO) 5-325 MG per tablet Take 1-2 tablets by mouth every 6 (six) hours as needed  (for pain). 10 tablet 0  . levETIRAcetam (KEPPRA) 750 MG tablet Take 1 tablet (750 mg total) by mouth 2 (two) times daily. 180 tablet 2  . losartan (COZAAR) 50 MG tablet Take 50 mg by mouth daily.    . ranitidine (ZANTAC) 150 MG tablet Take 150 mg by mouth 2 (two) times daily.      Marland Kitchen topiramate (TOPAMAX) 200 MG tablet Take 1 tablet (200 mg total) by mouth 2 (two) times daily. 180 tablet 1  . traMADol (ULTRAM) 50 MG tablet TAKE 1 TABLET BY MOUTH EVERY 6 HOURS AS NEEDED 45 tablet 5  . insulin glargine (LANTUS) 100 UNIT/ML injection Inject 0.3 mLs (30 Units total) into the skin at bedtime. (Patient not taking: Reported on 03/23/2015) 10 mL 12   No facility-administered medications prior to visit.    PAST MEDICAL HISTORY: Past Medical History  Diagnosis Date  . Hypertension   . GERD (gastroesophageal reflux disease)   . Enlarged heart   . Chronic bronchitis ([place])     "get it q yr" (12/28/2012)  . OSA on CPAP   . Type II diabetes mellitus ([place])   . KZLDJTTS(177.9)     "maybe 2/month" (12/28/2012)  . [place] mal seizure [facility]) ~ 1962    "last sz was 08/2012; long time before that" (12/28/2012)  . Arthritis     "all over my body" (12/28/2012)  . Chronic lower back pain     PAST SURGICAL HISTORY: Past Surgical History  Procedure Laterality Date  . Abdominal hysterectomy  1970's  . Dilation and curettage of uterus  1970's  . Cataract extraction w/ intraocular lens  implant, bilateral Bilateral ~ 2012  . Left heart catheterization with coronary angiogram N/A 03/02/2013    Procedure: LEFT HEART CATHETERIZATION WITH CORONARY ANGIOGRAM;  Surgeon: Laverda Page, MD;  Location: [facility] CATH LAB;  Service: Cardiovascular;  Laterality: N/A;    FAMILY HISTORY: Family History  Problem Relation Age of Onset  . Diabetes type II Sister   . Coronary artery disease Sister     SOCIAL HISTORY: Social History   Social History  . Marital Status: Married    Spouse Name: N/A  . Number of Children: N/A    . Years of Education: N/A   Occupational History  . Not on file.   Social History Main Topics  . Smoking status: Never Smoker   . Smokeless tobacco: Never Used  . Alcohol Use: No  . Drug Use: No  . Sexual Activity: No   Other Topics Concern  . Not on file   Social History Narrative     PHYSICAL EXAM  Filed Vitals:   03/23/15 0844  BP: 135/89  Pulse: 80  Height: 5\' 1"  (1.549 m)  Weight: 233 lb 6.4 oz (105.87 kg)   Body mass index is 44.12 kg/(m^2). Generalized: Well developed, obese female in no acute distress  Head: normocephalic and atraumatic,. Oropharynx benign  Neck: Supple, no carotid bruits  Cardiac: Regular rate rhythm, no murmur  Musculoskeletal: No deformity   Neurological examination   Mentation: Alert oriented to time, place, history taking. Follows all commands speech and language fluent Cranial nerve II-XII: Pupils were equal round reactive to light extraocular movements were full, visual field were full on confrontational  test. Facial sensation and strength were normal. hearing was intact to finger rubbing bilaterally. Uvula tongue midline. head turning and shoulder shrug were normal and symmetric.Tongue protrusion into cheek strength was normal. Motor: normal bulk and tone, full strength in the BUE, BLE, fine finger movements normal, no pronator drift. No focal weakness Sensory: normal and symmetric to light touch, pinprick, and vibration  Coordination: finger-nose-finger, heel-to-shin bilaterally, no dysmetria Gait: wide based, cautious, steady gait, bilateral knee valgus, no assistive device Reflexes: Brachioradialis 2/2, biceps 2/2, triceps 2/2, patellar 3/3, Achilles 2/2, plantar responses were flexor bilaterally.  DIAGNOSTIC DATA (LABS, IMAGING, TESTING) -  ASSESSMENT AND PLAN  69 y.o. year old female  has a past medical history of Hypertension;  OSA on CPAP; Type II diabetes mellitus ([place]); Headache(784.0); Grand mal seizure ([place]) (~  1962); and Chronic lower back pain. here to follow-up.   Continue home exercise program at least daily for gait disorder, improved flexibility use cane as necessary to prevent falls Continue Keppra at current dose for seizure disorder will refill through mail order Call for seizure activity Continue Topamax at current dose for migraines will refill through mail order Call for increase in headaches Continue Ultram when necessary acute headache does not need refills Follow-up in 6 months Dennie Bible, [facility], [facility], [facility] [address] [place], [place] [address] [phone]

## 2015-03-24 NOTE — Progress Notes (Signed)
I have reviewed and agreed above plan. 

## 2015-04-06 DIAGNOSIS — D1723 Benign lipomatous neoplasm of skin and subcutaneous tissue of right leg: Secondary | ICD-10-CM | POA: Diagnosis not present

## 2015-04-06 DIAGNOSIS — D1724 Benign lipomatous neoplasm of skin and subcutaneous tissue of left leg: Secondary | ICD-10-CM | POA: Diagnosis not present

## 2015-04-28 DIAGNOSIS — G43909 Migraine, unspecified, not intractable, without status migrainosus: Secondary | ICD-10-CM | POA: Diagnosis not present

## 2015-04-28 DIAGNOSIS — R0602 Shortness of breath: Secondary | ICD-10-CM | POA: Diagnosis not present

## 2015-04-28 DIAGNOSIS — E119 Type 2 diabetes mellitus without complications: Secondary | ICD-10-CM | POA: Diagnosis not present

## 2015-04-28 DIAGNOSIS — J45909 Unspecified asthma, uncomplicated: Secondary | ICD-10-CM | POA: Diagnosis not present

## 2015-04-28 DIAGNOSIS — I10 Essential (primary) hypertension: Secondary | ICD-10-CM | POA: Diagnosis not present

## 2015-04-28 DIAGNOSIS — E1140 Type 2 diabetes mellitus with diabetic neuropathy, unspecified: Secondary | ICD-10-CM | POA: Diagnosis not present

## 2015-05-26 DIAGNOSIS — E559 Vitamin D deficiency, unspecified: Secondary | ICD-10-CM | POA: Diagnosis not present

## 2015-05-26 DIAGNOSIS — E1165 Type 2 diabetes mellitus with hyperglycemia: Secondary | ICD-10-CM | POA: Diagnosis not present

## 2015-05-26 DIAGNOSIS — G4733 Obstructive sleep apnea (adult) (pediatric): Secondary | ICD-10-CM | POA: Diagnosis not present

## 2015-05-26 DIAGNOSIS — E113599 Type 2 diabetes mellitus with proliferative diabetic retinopathy without macular edema, unspecified eye: Secondary | ICD-10-CM | POA: Diagnosis not present

## 2015-05-26 DIAGNOSIS — I10 Essential (primary) hypertension: Secondary | ICD-10-CM | POA: Diagnosis not present

## 2015-06-21 ENCOUNTER — Emergency Department (HOSPITAL_BASED_OUTPATIENT_CLINIC_OR_DEPARTMENT_OTHER)
Admission: EM | Admit: 2015-06-21 | Discharge: 2015-06-22 | Disposition: A | Discharge status: Home / Self Care | Payer: Medicare Other | Attending: Physician Assistant | Admitting: Physician Assistant

## 2015-06-21 ENCOUNTER — Emergency Department (HOSPITAL_BASED_OUTPATIENT_CLINIC_OR_DEPARTMENT_OTHER): Payer: Medicare Other

## 2015-06-21 ENCOUNTER — Encounter (HOSPITAL_BASED_OUTPATIENT_CLINIC_OR_DEPARTMENT_OTHER): Payer: Self-pay

## 2015-06-21 DIAGNOSIS — Z7982 Long term (current) use of aspirin: Secondary | ICD-10-CM | POA: Diagnosis not present

## 2015-06-21 DIAGNOSIS — M1990 Unspecified osteoarthritis, unspecified site: Secondary | ICD-10-CM | POA: Insufficient documentation

## 2015-06-21 DIAGNOSIS — R05 Cough: Secondary | ICD-10-CM | POA: Insufficient documentation

## 2015-06-21 DIAGNOSIS — Z9981 Dependence on supplemental oxygen: Secondary | ICD-10-CM | POA: Insufficient documentation

## 2015-06-21 DIAGNOSIS — E119 Type 2 diabetes mellitus without complications: Secondary | ICD-10-CM | POA: Insufficient documentation

## 2015-06-21 DIAGNOSIS — R079 Chest pain, unspecified: Secondary | ICD-10-CM | POA: Diagnosis not present

## 2015-06-21 DIAGNOSIS — R110 Nausea: Secondary | ICD-10-CM | POA: Insufficient documentation

## 2015-06-21 DIAGNOSIS — Z8709 Personal history of other diseases of the respiratory system: Secondary | ICD-10-CM | POA: Diagnosis not present

## 2015-06-21 DIAGNOSIS — K219 Gastro-esophageal reflux disease without esophagitis: Secondary | ICD-10-CM | POA: Diagnosis not present

## 2015-06-21 DIAGNOSIS — Z9889 Other specified postprocedural states: Secondary | ICD-10-CM | POA: Diagnosis not present

## 2015-06-21 DIAGNOSIS — G40409 Other generalized epilepsy and epileptic syndromes, not intractable, without status epilepticus: Secondary | ICD-10-CM | POA: Insufficient documentation

## 2015-06-21 DIAGNOSIS — G4733 Obstructive sleep apnea (adult) (pediatric): Secondary | ICD-10-CM | POA: Diagnosis not present

## 2015-06-21 DIAGNOSIS — Z79899 Other long term (current) drug therapy: Secondary | ICD-10-CM | POA: Insufficient documentation

## 2015-06-21 DIAGNOSIS — Z7984 Long term (current) use of oral hypoglycemic drugs: Secondary | ICD-10-CM | POA: Insufficient documentation

## 2015-06-21 DIAGNOSIS — Z794 Long term (current) use of insulin: Secondary | ICD-10-CM | POA: Diagnosis not present

## 2015-06-21 DIAGNOSIS — I10 Essential (primary) hypertension: Secondary | ICD-10-CM | POA: Diagnosis not present

## 2015-06-21 DIAGNOSIS — R0602 Shortness of breath: Secondary | ICD-10-CM | POA: Diagnosis not present

## 2015-06-21 DIAGNOSIS — G8929 Other chronic pain: Secondary | ICD-10-CM | POA: Diagnosis not present

## 2015-06-21 DIAGNOSIS — R0789 Other chest pain: Secondary | ICD-10-CM | POA: Insufficient documentation

## 2015-06-21 LAB — BASIC METABOLIC PANEL
Anion gap: 5 (ref 5–15)
BUN: 17 mg/dL (ref 6–20)
CO2: 30 mmol/L (ref 22–32)
CREATININE: 1.06 mg/dL — AB (ref 0.44–1.00)
Calcium: 9.1 mg/dL (ref 8.9–10.3)
Chloride: 103 mmol/L (ref 101–111)
GFR, EST NON AFRICAN AMERICAN: 53 mL/min — AB (ref >60)
Glucose, Bld: 180 mg/dL — ABNORMAL HIGH (ref 65–99)
Potassium: 4.4 mmol/L (ref 3.5–5.1)
SODIUM: 138 mmol/L (ref 135–145)

## 2015-06-21 LAB — CBC
HCT: 40.1 % (ref 36.0–46.0)
Hemoglobin: 13 g/dL (ref 12.0–15.0)
MCH: 28.8 pg (ref 26.0–34.0)
MCHC: 32.4 g/dL (ref 30.0–36.0)
MCV: 88.7 fL (ref 78.0–100.0)
PLATELETS: 253 10*3/uL (ref 150–400)
RBC: 4.52 MIL/uL (ref 3.87–5.11)
RDW: 14.2 % (ref 11.5–15.5)
WBC: 11 10*3/uL — AB (ref 4.0–10.5)

## 2015-06-21 LAB — TROPONIN I: Troponin I: <0.03 ng/mL (ref <0.031)

## 2015-06-21 LAB — URINALYSIS, ROUTINE W REFLEX MICROSCOPIC
Bilirubin Urine: NEGATIVE
GLUCOSE, UA: NEGATIVE mg/dL
Hgb urine dipstick: NEGATIVE
Ketones, ur: NEGATIVE mg/dL
LEUKOCYTES UA: NEGATIVE
Nitrite: NEGATIVE
PROTEIN: NEGATIVE mg/dL
Specific Gravity, Urine: 1.013 (ref 1.005–1.030)
pH: 7 (ref 5.0–8.0)

## 2015-06-21 MED ORDER — ACETAMINOPHEN 325 MG PO TABS
650.0000 mg | ORAL_TABLET | Freq: Once | ORAL | Status: AC
  Administered 2015-06-21: 650 mg via ORAL
  Filled 2015-06-21: qty 2

## 2015-06-21 MED ORDER — GI COCKTAIL ~~LOC~~
30.0000 mL | Freq: Once | ORAL | Status: AC
  Administered 2015-06-21: 30 mL via ORAL
  Filled 2015-06-21: qty 30

## 2015-06-21 NOTE — ED Provider Notes (Signed)
CSN: [id number]     Arrival date & time 06/21/15  2123 History  By signing my name below, I, Helane Gunther, attest that this documentation has been prepared under the direction and in the presence of Dell Hurtubise Julio Alm, MD. Electronically Signed: Helane Gunther, ED Scribe. 06/21/2015. 10:27 PM.    Chief Complaint  Patient presents with  . Chest Pain   The history is provided by the patient and medical records. No language interpreter was used.   HPI Comments: [name] [name] is a 70 y.o. female with a PMHx of HTN, enlarged heart, GERD, and Type 2 DM who presents to the Emergency Department complaining of constant, sharp, worsening, left upper chest pain radiating into the left arm as well as into the back onset 3 days ago. She reports associated mild cough and mild nausea onset today. She notes exacerbation of the pain with movement and notes no difference when standing or lying supine. She notes a PMHx of the same and confirms a normal cardiac exam 2 years ago. She has not seen her PCP for this due to bad weather conditions. Per relative, pt's PCP is Dr Vista Lawman. She does not have a cardiologist.   Past Medical History  Diagnosis Date  . Hypertension   . GERD (gastroesophageal reflux disease)   . Enlarged heart   . Chronic bronchitis ([place])     "get it q yr" (12/28/2012)  . OSA on CPAP   . Type II diabetes mellitus ([place])   . [id number])     "maybe 2/month" (12/28/2012)  . [place] mal seizure [facility]) ~ 1962    "last sz was 08/2012; long time before that" (12/28/2012)  . Arthritis     "all over my body" (12/28/2012)  . Chronic lower back pain    Past Surgical History  Procedure Laterality Date  . Abdominal hysterectomy  1970's  . Dilation and curettage of uterus  1970's  . Cataract extraction w/ intraocular lens  implant, bilateral Bilateral ~ 2012  . Left heart catheterization with coronary angiogram N/A 03/02/2013    Procedure: LEFT HEART CATHETERIZATION WITH CORONARY ANGIOGRAM;   Surgeon: Laverda Page, MD;  Location: [facility] CATH LAB;  Service: Cardiovascular;  Laterality: N/A;   Family History  Problem Relation Age of Onset  . Diabetes type II Sister   . Coronary artery disease Sister    Social History  Substance Use Topics  . Smoking status: Never Smoker   . Smokeless tobacco: Never Used  . Alcohol Use: No   OB History    No data available     Review of Systems  Respiratory: Positive for cough.   Cardiovascular: Positive for chest pain.  Gastrointestinal: Positive for nausea.  All other systems reviewed and are negative.   Allergies  Review of patient's allergies indicates no known allergies.  Home Medications   Prior to Admission medications   Medication Sig Start Date End Date Taking? Authorizing Provider  albuterol (PROVENTIL HFA;VENTOLIN HFA) 108 (90 BASE) MCG/ACT inhaler Inhale 2 puffs into the lungs every 6 (six) hours as needed for wheezing.    Historical Provider, MD  aspirin 81 MG chewable tablet Chew 81 mg by mouth daily.    Historical Provider, MD  cholecalciferol (VITAMIN D) 1000 UNITS tablet Take 1 tablet (1,000 Units total) by mouth daily. 12/31/12   Sheila Oats, MD  dorzolamide (TRUSOPT) 2 % ophthalmic solution Place 1 drop into both eyes 3 (three) times daily.    Historical Provider, MD  gabapentin (NEURONTIN)  300 MG capsule Take 300 mg by mouth 3 (three) times daily.    Historical Provider, MD  hydrochlorothiazide (HYDRODIURIL) 25 MG tablet Take 25 mg by mouth daily.      Historical Provider, MD  HYDROcodone-acetaminophen (NORCO) 5-325 MG per tablet Take 1-2 tablets by mouth every 6 (six) hours as needed (for pain). 02/17/14   John Molpus, MD  Insulin Detemir (LEVEMIR FLEXTOUCH) 100 UNIT/ML Pen Inject into the skin. 03/14/15 03/13/16  Historical Provider, MD  levETIRAcetam (KEPPRA) 750 MG tablet Take 1 tablet (750 mg total) by mouth 2 (two) times daily. 03/23/15   Dennie Bible, NP  Liraglutide (VICTOZA) 18 MG/3ML SOPN 1.8  mg. 11/16/14   Historical Provider, MD  losartan (COZAAR) 50 MG tablet Take 50 mg by mouth daily.    Historical Provider, MD  ranitidine (ZANTAC) 150 MG tablet Take 150 mg by mouth 2 (two) times daily.      Historical Provider, MD  topiramate (TOPAMAX) 200 MG tablet Take 1 tablet (200 mg total) by mouth 2 (two) times daily. 03/23/15   Dennie Bible, NP  traMADol (ULTRAM) 50 MG tablet TAKE 1 TABLET BY MOUTH EVERY 6 HOURS AS NEEDED 01/10/15   Marcial Pacas, MD   BP 133/69 mmHg  Pulse 84  Temp(Src) 98.1 F (36.7 C) (Oral)  Resp 20  Ht 5\' 1"  (1.549 m)  Wt 224 lb (101.606 kg)  BMI 42.35 kg/m2  SpO2 100% Physical Exam  Constitutional: She appears well-developed and well-nourished.  obese  HENT:  Head: Normocephalic and atraumatic.  Eyes: Conjunctivae are normal. Right eye exhibits no discharge. Left eye exhibits no discharge.  Cardiovascular: Normal rate, regular rhythm and normal heart sounds.   Pulmonary/Chest: Effort normal and breath sounds normal. No respiratory distress.  Lungs are CTA bilaterally  Neurological: She is alert. Coordination normal.  Skin: Skin is warm and dry. No rash noted. She is not diaphoretic. No erythema.  Psychiatric: She has a normal mood and affect.  Nursing note and vitals reviewed.   ED Course  Procedures  DIAGNOSTIC STUDIES: Oxygen Saturation is 100% on RA, normal by my interpretation.    COORDINATION OF CARE: 10:26 PM - Discussed plans to order diagnostic studies. Pt advised of plan for treatment and pt agrees.  Labs Review Labs Reviewed  CBC - Abnormal; Notable for the following:    WBC 11.0 (*)    All other components within normal limits  URINE CULTURE  BASIC METABOLIC PANEL  TROPONIN I  URINALYSIS, ROUTINE W REFLEX MICROSCOPIC (NOT AT [facility])    Imaging Review No results found. I have personally reviewed and evaluated these images and lab results as part of my medical decision-making.   EKG Interpretation   Date/Time:  Wednesday  June 21 2015 21:33:37 EST Ventricular Rate:  78 PR Interval:  178 QRS Duration: 70 QT Interval:  384 QTC Calculation: 437 R Axis:   49 Text Interpretation:  Normal sinus rhythm Cannot rule out Anterior infarct  , age undetermined Abnormal ECG no acute ischemia No significant change  since last tracing Confirmed by Gerald Leitz ([id number]) on 06/21/2015  9:51:10 PM      MDM   Final diagnoses:  None    Patient is a 70 year old female past history significant for hypertension GERD and diabetes stenting today with chest pain for last 2-3 days in her upper chest radiate into her left arm. Patient's been unable to notice what makes her chest pain worse or better. Patient had normal cardiac catheterization 2  years ago. Patient does not have a cardiologist currently. Given patient's risk factors we will need to do a delta troponin on patient. Patient does not have any risk factors for pulmonary embolism including no recent long car trips, no swelling unilaterally no estrogens and no vital sign abnormalities.  Given the length of the chest pain, it would be atypical for ischemic heart disease.   Pain sounds very atypical. We will treat with GI cocktail, Tylenol. We'll do delta troponin. If both negative with outpatient follow-up with her primary care physician in the next 1-2 days.  I personally performed the services described in this documentation, which was scribed in my presence. The recorded information has been reviewed and is accurate.     Vincenza Dail Julio Alm, MD 06/22/15 [phone]

## 2015-06-21 NOTE — ED Notes (Signed)
C/o cp x 3 days w sob, pain radiating into left arm

## 2015-06-21 NOTE — ED Notes (Signed)
CP x 3 days 

## 2015-06-22 LAB — TROPONIN I: Troponin I: <0.03 ng/mL (ref <0.031)

## 2015-06-23 LAB — URINE CULTURE

## 2015-07-13 ENCOUNTER — Other Ambulatory Visit: Payer: Self-pay | Admitting: Nurse Practitioner

## 2015-07-21 DIAGNOSIS — Z7689 Persons encountering health services in other specified circumstances: Secondary | ICD-10-CM | POA: Diagnosis not present

## 2015-07-21 DIAGNOSIS — M545 Low back pain: Secondary | ICD-10-CM | POA: Diagnosis not present

## 2015-07-21 DIAGNOSIS — Z01118 Encounter for examination of ears and hearing with other abnormal findings: Secondary | ICD-10-CM | POA: Diagnosis not present

## 2015-07-21 DIAGNOSIS — Z0000 Encounter for general adult medical examination without abnormal findings: Secondary | ICD-10-CM | POA: Diagnosis not present

## 2015-07-21 DIAGNOSIS — E119 Type 2 diabetes mellitus without complications: Secondary | ICD-10-CM | POA: Diagnosis not present

## 2015-07-21 DIAGNOSIS — I10 Essential (primary) hypertension: Secondary | ICD-10-CM | POA: Diagnosis not present

## 2015-07-24 DIAGNOSIS — Z79899 Other long term (current) drug therapy: Secondary | ICD-10-CM | POA: Diagnosis not present

## 2015-07-24 DIAGNOSIS — G4733 Obstructive sleep apnea (adult) (pediatric): Secondary | ICD-10-CM | POA: Diagnosis not present

## 2015-07-24 DIAGNOSIS — E113313 Type 2 diabetes mellitus with moderate nonproliferative diabetic retinopathy with macular edema, bilateral: Secondary | ICD-10-CM | POA: Diagnosis not present

## 2015-07-24 DIAGNOSIS — Z7982 Long term (current) use of aspirin: Secondary | ICD-10-CM | POA: Diagnosis not present

## 2015-07-24 DIAGNOSIS — Z794 Long term (current) use of insulin: Secondary | ICD-10-CM | POA: Diagnosis not present

## 2015-07-24 DIAGNOSIS — I10 Essential (primary) hypertension: Secondary | ICD-10-CM | POA: Diagnosis not present

## 2015-07-24 DIAGNOSIS — H401134 Primary open-angle glaucoma, bilateral, indeterminate stage: Secondary | ICD-10-CM | POA: Diagnosis not present

## 2015-07-24 DIAGNOSIS — H401132 Primary open-angle glaucoma, bilateral, moderate stage: Secondary | ICD-10-CM | POA: Diagnosis not present

## 2015-07-24 DIAGNOSIS — Z961 Presence of intraocular lens: Secondary | ICD-10-CM | POA: Diagnosis not present

## 2015-07-31 ENCOUNTER — Other Ambulatory Visit: Payer: Self-pay

## 2015-07-31 DIAGNOSIS — Z1231 Encounter for screening mammogram for malignant neoplasm of breast: Secondary | ICD-10-CM

## 2015-08-08 DIAGNOSIS — I10 Essential (primary) hypertension: Secondary | ICD-10-CM | POA: Diagnosis not present

## 2015-08-08 DIAGNOSIS — E1140 Type 2 diabetes mellitus with diabetic neuropathy, unspecified: Secondary | ICD-10-CM | POA: Diagnosis not present

## 2015-08-10 DIAGNOSIS — E1140 Type 2 diabetes mellitus with diabetic neuropathy, unspecified: Secondary | ICD-10-CM | POA: Diagnosis not present

## 2015-08-10 DIAGNOSIS — I10 Essential (primary) hypertension: Secondary | ICD-10-CM | POA: Diagnosis not present

## 2015-08-17 ENCOUNTER — Ambulatory Visit
Admission: RE | Admit: 2015-08-17 | Discharge: 2015-08-17 | Disposition: A | Discharge status: Home / Self Care | Payer: Medicare Other | Source: Ambulatory Visit

## 2015-08-17 DIAGNOSIS — Z1231 Encounter for screening mammogram for malignant neoplasm of breast: Secondary | ICD-10-CM | POA: Diagnosis not present

## 2015-08-25 DIAGNOSIS — E119 Type 2 diabetes mellitus without complications: Secondary | ICD-10-CM | POA: Diagnosis not present

## 2015-08-25 DIAGNOSIS — J45909 Unspecified asthma, uncomplicated: Secondary | ICD-10-CM | POA: Diagnosis not present

## 2015-08-25 DIAGNOSIS — E1140 Type 2 diabetes mellitus with diabetic neuropathy, unspecified: Secondary | ICD-10-CM | POA: Diagnosis not present

## 2015-08-25 DIAGNOSIS — I10 Essential (primary) hypertension: Secondary | ICD-10-CM | POA: Diagnosis not present

## 2015-08-25 DIAGNOSIS — G43909 Migraine, unspecified, not intractable, without status migrainosus: Secondary | ICD-10-CM | POA: Diagnosis not present

## 2015-09-04 DIAGNOSIS — E1142 Type 2 diabetes mellitus with diabetic polyneuropathy: Secondary | ICD-10-CM | POA: Diagnosis not present

## 2015-09-04 DIAGNOSIS — M25552 Pain in left hip: Secondary | ICD-10-CM | POA: Diagnosis not present

## 2015-09-04 DIAGNOSIS — G8929 Other chronic pain: Secondary | ICD-10-CM | POA: Diagnosis not present

## 2015-09-04 DIAGNOSIS — M5442 Lumbago with sciatica, left side: Secondary | ICD-10-CM | POA: Diagnosis not present

## 2015-09-07 DIAGNOSIS — M25551 Pain in right hip: Secondary | ICD-10-CM | POA: Diagnosis not present

## 2015-09-07 DIAGNOSIS — D1809 Hemangioma of other sites: Secondary | ICD-10-CM | POA: Diagnosis not present

## 2015-09-07 DIAGNOSIS — M4316 Spondylolisthesis, lumbar region: Secondary | ICD-10-CM | POA: Diagnosis not present

## 2015-09-07 DIAGNOSIS — M545 Low back pain: Secondary | ICD-10-CM | POA: Diagnosis not present

## 2015-09-07 DIAGNOSIS — M5442 Lumbago with sciatica, left side: Secondary | ICD-10-CM | POA: Diagnosis not present

## 2015-09-07 DIAGNOSIS — M25552 Pain in left hip: Secondary | ICD-10-CM | POA: Diagnosis not present

## 2015-09-07 DIAGNOSIS — N281 Cyst of kidney, acquired: Secondary | ICD-10-CM | POA: Diagnosis not present

## 2015-09-07 DIAGNOSIS — M160 Bilateral primary osteoarthritis of hip: Secondary | ICD-10-CM | POA: Diagnosis not present

## 2015-09-07 DIAGNOSIS — G8929 Other chronic pain: Secondary | ICD-10-CM | POA: Diagnosis not present

## 2015-09-13 ENCOUNTER — Ambulatory Visit (INDEPENDENT_AMBULATORY_CARE_PROVIDER_SITE_OTHER): Payer: Medicare Other | Admitting: Nurse Practitioner

## 2015-09-13 ENCOUNTER — Encounter: Payer: Self-pay | Admitting: Nurse Practitioner

## 2015-09-13 VITALS — BP 142/83 | HR 71 | Ht 61.0 in | Wt 218.6 lb

## 2015-09-13 DIAGNOSIS — R269 Unspecified abnormalities of gait and mobility: Secondary | ICD-10-CM

## 2015-09-13 DIAGNOSIS — G40409 Other generalized epilepsy and epileptic syndromes, not intractable, without status epilepticus: Secondary | ICD-10-CM | POA: Diagnosis not present

## 2015-09-13 DIAGNOSIS — G40909 Epilepsy, unspecified, not intractable, without status epilepticus: Secondary | ICD-10-CM

## 2015-09-13 DIAGNOSIS — G43009 Migraine without aura, not intractable, without status migrainosus: Secondary | ICD-10-CM | POA: Diagnosis not present

## 2015-09-13 MED ORDER — TOPIRAMATE 200 MG PO TABS: ORAL_TABLET | ORAL | Status: DC

## 2015-09-13 MED ORDER — TRAMADOL HCL 50 MG PO TABS: 50.0000 mg | ORAL_TABLET | Freq: Four times a day (QID) | ORAL | Status: DC | PRN

## 2015-09-13 MED ORDER — LEVETIRACETAM 750 MG PO TABS: 750.0000 mg | ORAL_TABLET | Freq: Two times a day (BID) | ORAL | Status: DC

## 2015-09-13 NOTE — Patient Instructions (Signed)
Continue Keppra at current dose for seizure disorder will refill through mail order Call for seizure activity Continue Topamax at current dose for migraines will refill through mail order Call for increase in headaches Continue Ultram when necessary acute headache RX to patient Follow-up in 6 months

## 2015-09-13 NOTE — Progress Notes (Signed)
[facility]  PATIENT: [name] [name] DOB: 1946/05/05   REASON FOR VISIT: follow up for seizure disorder, migraine headaches abnormality of gait HISTORY FROM:patient    HISTORY OF PRESENT ILLNESS: HISTORY: [name] [name] is a 70 year old right-handed African American female, accompanied by her daughter [name] [name] referred by her primary care physician [name] for evaluation of seizure, abnormal MRI of brain, she was recently evaluated by [facility] Neurologist. [name]. She had a history of seizure since age 14, generalized tonic-clonic seizure, no warning signs, initially she was treated with Dilantin and phenobarbital, recent 10 years, she had been treated with titrating dose of Topamax 100 +200 mg twice a day, Keppra 750 mg twice a day, she denies significant side effects. Most recent seizure was in April 2015, she also had recent MRI of the brain, was told that she had small vessel disease, but per patient, there was no change in treatment plan, She also complains of few years history of rapid onset gait difficulty, occasional urinary incontinence, she has past medical history of obesity, insulin-dependent diabetes, hypertension. She has never driven, live at home with her husband, we have reviewed MRI of the brain from March 2015, from [facility], moderate periventricular white matter disease, no acute lesions. EEG was normal Topamax level was 15.6, Keppra level was 8:6, while taking Topamax 100 plus 200 mg twice a day, Keppra 750 mg twice a day, she is tolerating the medication well, there is no significant side effect.  UPDATE 10//13/2016: [name] [name], 70 year old female returns for follow-up. Since last seen her left knee pain leg pain is better. PT was beneficial and she continues HEP. She denies any falls, occasionally uses a cane or walker. She denies any bowel or bladder incontinence. She has history of migraine and Topamax has decreased her  headaches down to less than once weekly but sometimes she may have five headaches/month. Her headaches occur left retro-orbital orbital pounding associated with light and noise sensitivity lasting a few hours. She also has a history of seizure disorder currently on Keppra 750 twice a day. Last seizure activity was April 2015. She also has a history of low back pain. MRI lumbar spine 01/19/14 with progression of foraminal stenosis at L4-5. She returns for refills and reevaluation UPDATE 04/05/2017[name], 70 year old female returns for follow-up. She has a history of migraine headaches which are in good control with Topamax. She also has a history of seizure disorder with last seizure occurring in April 2015. She is currently on Keppra without side effects. She has had 1 recent fall when she slipped in the bathroom getting out of the shower. She fell on her hip and has an MRI scheduled. She claims her diabetes has been in good control. She denies any bowel or bladder incontinence. She has a history of foraminal stenosis at L4-L5. She returns for reevaluation  REVIEW OF SYSTEMS: Full 14 system review of systems performed and notable only for those listed, all others are neg:  Constitutional: neg  Cardiovascular: neg Ear/Nose/Throat: neg  Skin: neg Eyes: neg Respiratory: neg Gastroitestinal: neg  Hematology/Lymphatic: neg  Endocrine: neg Musculoskeletal:back pain Allergy/Immunology: neg Neurological: headache, seizure Psychiatric: neg Sleep : neg   ALLERGIES: No Known Allergies  HOME MEDICATIONS: Outpatient Prescriptions Prior to Visit  Medication Sig Dispense Refill  . albuterol (PROVENTIL HFA;VENTOLIN HFA) 108 (90 BASE) MCG/ACT inhaler Inhale 2 puffs into the lungs every 6 (six) hours as needed for wheezing.    Marland Kitchen aspirin 81 MG chewable tablet  Chew 81 mg by mouth daily.    . cholecalciferol (VITAMIN D) 1000 UNITS tablet Take 1 tablet (1,000 Units total) by mouth daily.    . dorzolamide  (TRUSOPT) 2 % ophthalmic solution Place 1 drop into both eyes 3 (three) times daily.    Marland Kitchen gabapentin (NEURONTIN) 300 MG capsule Take 300 mg by mouth 3 (three) times daily.    . hydrochlorothiazide (HYDRODIURIL) 25 MG tablet Take 25 mg by mouth daily.      Marland Kitchen HYDROcodone-acetaminophen (NORCO) 5-325 MG per tablet Take 1-2 tablets by mouth every 6 (six) hours as needed (for pain). 10 tablet 0  . Insulin Detemir (LEVEMIR FLEXTOUCH) 100 UNIT/ML Pen Inject into the skin.    Marland Kitchen levETIRAcetam (KEPPRA) 750 MG tablet Take 1 tablet (750 mg total) by mouth 2 (two) times daily. 180 tablet 2  . losartan (COZAAR) 50 MG tablet Take 50 mg by mouth daily.    . ranitidine (ZANTAC) 150 MG tablet Take 150 mg by mouth 2 (two) times daily.      Marland Kitchen topiramate (TOPAMAX) 200 MG tablet Take 1 tablet by mouth two  times daily 180 tablet 0  . traMADol (ULTRAM) 50 MG tablet TAKE 1 TABLET BY MOUTH EVERY 6 HOURS AS NEEDED 45 tablet 5  . Liraglutide (VICTOZA) 18 MG/3ML SOPN 1.8 mg. Reported on 09/13/2015     No facility-administered medications prior to visit.    PAST MEDICAL HISTORY: Past Medical History  Diagnosis Date  . Hypertension   . GERD (gastroesophageal reflux disease)   . Enlarged heart   . Chronic bronchitis ([place])     "get it q yr" (12/28/2012)  . OSA on CPAP   . Type II diabetes mellitus ([place])   . [id number])     "maybe 2/month" (12/28/2012)  . Marvin mal seizure [facility]) ~ 1962    "last sz was 08/2012; long time before that" (12/28/2012)  . Arthritis     "all over my body" (12/28/2012)  . Chronic lower back pain     PAST SURGICAL HISTORY: Past Surgical History  Procedure Laterality Date  . Abdominal hysterectomy  1970's  . Dilation and curettage of uterus  1970's  . Cataract extraction w/ intraocular lens  implant, bilateral Bilateral ~ 2012  . Left heart catheterization with coronary angiogram N/A 03/02/2013    Procedure: LEFT HEART CATHETERIZATION WITH CORONARY ANGIOGRAM;  Surgeon: Laverda Page,  MD;  Location: [facility] CATH LAB;  Service: Cardiovascular;  Laterality: N/A;    FAMILY HISTORY: Family History  Problem Relation Age of Onset  . Diabetes type II Sister   . Coronary artery disease Sister   . Seizures Sister   . Seizures Daughter     SOCIAL HISTORY: Social History   Social History  . Marital Status: Married    Spouse Name: N/A  . Number of Children: N/A  . Years of Education: N/A   Occupational History  . Not on file.   Social History Main Topics  . Smoking status: Never Smoker   . Smokeless tobacco: Never Used  . Alcohol Use: No  . Drug Use: No  . Sexual Activity: Not on file   Other Topics Concern  . Not on file   Social History Narrative     PHYSICAL EXAM  Filed Vitals:   09/13/15 1024  BP: 142/83  Pulse: 71  Height: 5\' 1"  (1.549 m)  Weight: 218 lb 9.6 oz (99.156 kg)   Body mass index is 41.33 kg/(m^2). Generalized: Well developed, obese  female in no acute distress  Head: normocephalic and atraumatic,. Oropharynx benign  Neck: Supple, no carotid bruits  Cardiac: Regular rate rhythm, no murmur  Musculoskeletal: No deformity   Neurological examination   Mentation: Alert oriented to time, place, history taking. Follows all commands speech and language fluent Cranial nerve II-XII: Pupils were equal round reactive to light extraocular movements were full, visual field were full on confrontational test. Facial sensation and strength were normal. hearing was intact to finger rubbing bilaterally. Uvula tongue midline. head turning and shoulder shrug were normal and symmetric.Tongue protrusion into cheek strength was normal. Motor: normal bulk and tone, full strength in the BUE, BLE, fine finger movements normal, no pronator drift. No focal weakness Sensory: normal and symmetric to light touch, in the upper and lower extremities Coordination: finger-nose-finger, heel-to-shin bilaterally, no dysmetria Gait: wide based, cautious, steady gait,  bilateral knee valgus, no assistive device Reflexes: Brachioradialis 2/2, biceps 2/2, triceps 2/2, patellar 3/3, Achilles 2/2, plantar responses were flexor bilaterally.  DIAGNOSTIC DATA (LABS, IMAGING, TESTING) - I reviewed patient records, labs, notes, testing and imaging myself where available.  Lab Results  Component Value Date   WBC 11.0* 06/21/2015   HGB 13.0 06/21/2015   HCT 40.1 06/21/2015   MCV 88.7 06/21/2015   PLT 253 06/21/2015      Component Value Date/Time   NA 138 06/21/2015 2230   K 4.4 06/21/2015 2230   CL 103 06/21/2015 2230   CO2 30 06/21/2015 2230   GLUCOSE 180* 06/21/2015 2230   BUN 17 06/21/2015 2230   CREATININE 1.06* 06/21/2015 2230   CALCIUM 9.1 06/21/2015 2230   PROT 7.6 02/17/2014 0004   ALBUMIN 3.5 02/17/2014 0004   AST 18 02/17/2014 0004   ALT 13 02/17/2014 0004   ALKPHOS 94 02/17/2014 0004   BILITOT <0.2* 02/17/2014 0004   GFRNONAA 64* 06/21/2015 2230   GFRAA >60 06/21/2015 2230       ASSESSMENT AND PLAN  70 y.o. year old female  has a past medical history of Hypertension;  OSA on CPAP; Type II diabetes mellitus ([place]); Headache(784.0); Grand mal seizure ([place]) (~ 1962); Arthritis; and Chronic lower back pain. Abnormality of gait here to follow-up. She has had one fall since last seen and is due to get an MRI of her hip in the next week.  PLAN:  Continue home exercise program at least daily for gait disorder, improved flexibility use cane as necessary to prevent falls Continue Keppra at current dose for seizure disorder will refill through mail order Call for seizure activity Continue Topamax at current dose for migraines will refill through mail order Call for increase in headaches Continue Ultram when necessary acute headache RX to patient Follow-up in 6 months [name] Bible, [facility], [facility], [facility] [address] [place], [place] [address] [phone]

## 2015-09-14 DIAGNOSIS — I10 Essential (primary) hypertension: Secondary | ICD-10-CM | POA: Diagnosis not present

## 2015-09-14 DIAGNOSIS — E1140 Type 2 diabetes mellitus with diabetic neuropathy, unspecified: Secondary | ICD-10-CM | POA: Diagnosis not present

## 2015-09-14 NOTE — Progress Notes (Signed)
I have reviewed and agreed above plan. 

## 2015-09-20 ENCOUNTER — Ambulatory Visit: Payer: Medicare Other | Admitting: Nurse Practitioner

## 2015-09-26 DIAGNOSIS — I10 Essential (primary) hypertension: Secondary | ICD-10-CM | POA: Diagnosis not present

## 2015-09-26 DIAGNOSIS — E559 Vitamin D deficiency, unspecified: Secondary | ICD-10-CM | POA: Diagnosis not present

## 2015-09-26 DIAGNOSIS — G4733 Obstructive sleep apnea (adult) (pediatric): Secondary | ICD-10-CM | POA: Diagnosis not present

## 2015-09-26 DIAGNOSIS — E119 Type 2 diabetes mellitus without complications: Secondary | ICD-10-CM | POA: Diagnosis not present

## 2015-09-27 DIAGNOSIS — M533 Sacrococcygeal disorders, not elsewhere classified: Secondary | ICD-10-CM | POA: Diagnosis not present

## 2015-09-27 DIAGNOSIS — M545 Low back pain: Secondary | ICD-10-CM | POA: Diagnosis not present

## 2015-09-27 DIAGNOSIS — G8929 Other chronic pain: Secondary | ICD-10-CM | POA: Diagnosis not present

## 2015-10-12 DIAGNOSIS — I10 Essential (primary) hypertension: Secondary | ICD-10-CM | POA: Diagnosis not present

## 2015-10-12 DIAGNOSIS — E1140 Type 2 diabetes mellitus with diabetic neuropathy, unspecified: Secondary | ICD-10-CM | POA: Diagnosis not present

## 2015-10-13 DIAGNOSIS — G43909 Migraine, unspecified, not intractable, without status migrainosus: Secondary | ICD-10-CM | POA: Diagnosis not present

## 2015-10-13 DIAGNOSIS — E119 Type 2 diabetes mellitus without complications: Secondary | ICD-10-CM | POA: Diagnosis not present

## 2015-10-13 DIAGNOSIS — Z0000 Encounter for general adult medical examination without abnormal findings: Secondary | ICD-10-CM | POA: Diagnosis not present

## 2015-10-13 DIAGNOSIS — E1140 Type 2 diabetes mellitus with diabetic neuropathy, unspecified: Secondary | ICD-10-CM | POA: Diagnosis not present

## 2015-10-13 DIAGNOSIS — I10 Essential (primary) hypertension: Secondary | ICD-10-CM | POA: Diagnosis not present

## 2015-11-14 DIAGNOSIS — I10 Essential (primary) hypertension: Secondary | ICD-10-CM | POA: Diagnosis not present

## 2015-11-14 DIAGNOSIS — E1140 Type 2 diabetes mellitus with diabetic neuropathy, unspecified: Secondary | ICD-10-CM | POA: Diagnosis not present

## 2015-12-15 DIAGNOSIS — I10 Essential (primary) hypertension: Secondary | ICD-10-CM | POA: Diagnosis not present

## 2015-12-15 DIAGNOSIS — E1140 Type 2 diabetes mellitus with diabetic neuropathy, unspecified: Secondary | ICD-10-CM | POA: Diagnosis not present

## 2015-12-27 DIAGNOSIS — E119 Type 2 diabetes mellitus without complications: Secondary | ICD-10-CM | POA: Diagnosis not present

## 2015-12-27 DIAGNOSIS — R200 Anesthesia of skin: Secondary | ICD-10-CM | POA: Diagnosis not present

## 2015-12-27 DIAGNOSIS — R0989 Other specified symptoms and signs involving the circulatory and respiratory systems: Secondary | ICD-10-CM | POA: Diagnosis not present

## 2015-12-29 DIAGNOSIS — E119 Type 2 diabetes mellitus without complications: Secondary | ICD-10-CM | POA: Diagnosis not present

## 2015-12-29 DIAGNOSIS — E1140 Type 2 diabetes mellitus with diabetic neuropathy, unspecified: Secondary | ICD-10-CM | POA: Diagnosis not present

## 2015-12-29 DIAGNOSIS — I10 Essential (primary) hypertension: Secondary | ICD-10-CM | POA: Diagnosis not present

## 2015-12-29 DIAGNOSIS — G43909 Migraine, unspecified, not intractable, without status migrainosus: Secondary | ICD-10-CM | POA: Diagnosis not present

## 2015-12-29 DIAGNOSIS — M545 Low back pain: Secondary | ICD-10-CM | POA: Diagnosis not present

## 2016-01-06 ENCOUNTER — Other Ambulatory Visit: Payer: Self-pay | Admitting: Internal Medicine

## 2016-01-06 DIAGNOSIS — E119 Type 2 diabetes mellitus without complications: Secondary | ICD-10-CM

## 2016-01-09 ENCOUNTER — Emergency Department (HOSPITAL_BASED_OUTPATIENT_CLINIC_OR_DEPARTMENT_OTHER): Payer: Medicare Other

## 2016-01-09 ENCOUNTER — Encounter (HOSPITAL_BASED_OUTPATIENT_CLINIC_OR_DEPARTMENT_OTHER): Payer: Self-pay | Admitting: *Deleted

## 2016-01-09 ENCOUNTER — Emergency Department (HOSPITAL_BASED_OUTPATIENT_CLINIC_OR_DEPARTMENT_OTHER)
Admission: EM | Admit: 2016-01-09 | Discharge: 2016-01-09 | Disposition: A | Discharge status: Home / Self Care | Payer: Medicare Other | Attending: Emergency Medicine | Admitting: Emergency Medicine

## 2016-01-09 DIAGNOSIS — R519 Headache, unspecified: Secondary | ICD-10-CM

## 2016-01-09 DIAGNOSIS — R200 Anesthesia of skin: Secondary | ICD-10-CM | POA: Diagnosis not present

## 2016-01-09 DIAGNOSIS — E119 Type 2 diabetes mellitus without complications: Secondary | ICD-10-CM | POA: Diagnosis not present

## 2016-01-09 DIAGNOSIS — R51 Headache: Secondary | ICD-10-CM | POA: Diagnosis not present

## 2016-01-09 DIAGNOSIS — M542 Cervicalgia: Secondary | ICD-10-CM | POA: Insufficient documentation

## 2016-01-09 DIAGNOSIS — Z794 Long term (current) use of insulin: Secondary | ICD-10-CM | POA: Insufficient documentation

## 2016-01-09 DIAGNOSIS — I10 Essential (primary) hypertension: Secondary | ICD-10-CM | POA: Diagnosis not present

## 2016-01-09 DIAGNOSIS — R42 Dizziness and giddiness: Secondary | ICD-10-CM | POA: Diagnosis not present

## 2016-01-09 LAB — CBC WITH DIFFERENTIAL/PLATELET
BASOS ABS: 0 10*3/uL (ref 0.0–0.1)
Basophils Relative: 0 %
Eosinophils Absolute: 0.2 10*3/uL (ref 0.0–0.7)
Eosinophils Relative: 2 %
HCT: 37.9 % (ref 36.0–46.0)
HEMOGLOBIN: 12.4 g/dL (ref 12.0–15.0)
LYMPHS ABS: 2.2 10*3/uL (ref 0.7–4.0)
LYMPHS PCT: 23 %
MCH: 29.2 pg (ref 26.0–34.0)
MCHC: 32.7 g/dL (ref 30.0–36.0)
MCV: 89.2 fL (ref 78.0–100.0)
Monocytes Absolute: 0.6 10*3/uL (ref 0.1–1.0)
Monocytes Relative: 6 %
NEUTROS PCT: 69 %
Neutro Abs: 6.7 10*3/uL (ref 1.7–7.7)
Platelets: 258 10*3/uL (ref 150–400)
RBC: 4.25 MIL/uL (ref 3.87–5.11)
RDW: 13.8 % (ref 11.5–15.5)
WBC: 9.7 10*3/uL (ref 4.0–10.5)

## 2016-01-09 LAB — BASIC METABOLIC PANEL
ANION GAP: 7 (ref 5–15)
BUN: 16 mg/dL (ref 6–20)
CHLORIDE: 102 mmol/L (ref 101–111)
CO2: 30 mmol/L (ref 22–32)
Calcium: 8.8 mg/dL — ABNORMAL LOW (ref 8.9–10.3)
Creatinine, Ser: 0.89 mg/dL (ref 0.44–1.00)
Glucose, Bld: 128 mg/dL — ABNORMAL HIGH (ref 65–99)
POTASSIUM: 3.1 mmol/L — AB (ref 3.5–5.1)
SODIUM: 139 mmol/L (ref 135–145)

## 2016-01-09 MED ORDER — POTASSIUM CHLORIDE CRYS ER 20 MEQ PO TBCR
40.0000 meq | EXTENDED_RELEASE_TABLET | Freq: Once | ORAL | Status: AC
  Administered 2016-01-09: 40 meq via ORAL
  Filled 2016-01-09: qty 2

## 2016-01-09 MED ORDER — SODIUM CHLORIDE 0.9 % IV BOLUS (SEPSIS)
1000.0000 mL | Freq: Once | INTRAVENOUS | Status: AC
  Administered 2016-01-09: 1000 mL via INTRAVENOUS

## 2016-01-09 MED ORDER — IOPAMIDOL (ISOVUE-370) INJECTION 76%
100.0000 mL | Freq: Once | INTRAVENOUS | Status: AC | PRN
  Administered 2016-01-09: 100 mL via INTRAVENOUS

## 2016-01-09 MED ORDER — BUTALBITAL-APAP-CAFFEINE 50-325-40 MG PO TABS: 1.0000 | ORAL_TABLET | Freq: Three times a day (TID) | ORAL | 0 refills | Status: AC | PRN

## 2016-01-09 MED ORDER — DIPHENHYDRAMINE HCL 50 MG/ML IJ SOLN
25.0000 mg | Freq: Once | INTRAMUSCULAR | Status: AC
  Administered 2016-01-09: 25 mg via INTRAVENOUS
  Filled 2016-01-09: qty 1

## 2016-01-09 MED ORDER — METOCLOPRAMIDE HCL 5 MG/ML IJ SOLN
10.0000 mg | Freq: Once | INTRAMUSCULAR | Status: AC
  Administered 2016-01-09: 10 mg via INTRAVENOUS
  Filled 2016-01-09: qty 2

## 2016-01-09 NOTE — ED Triage Notes (Signed)
Headache for "a while", lightheaded and dizziness since yesterday.

## 2016-01-09 NOTE — ED Provider Notes (Signed)
[facility] DE[facility] Provider Note   CSN: [id number] Arrival date & time: 01/09/16  1603  First Provider Contact:  First MD Initiated Contact with Patient 01/09/16 1619     By signing my name below, I, Reola Mosher, attest that this documentation has been prepared under the direction and in the presence of Domenic Moras, PA-C.  Electronically Signed: Reola Mosher, ED Scribe. 01/09/16. 4:35 PM.  History   Chief Complaint Chief Complaint  Patient presents with  . Headache   The history is provided by the patient. No language interpreter was used.   HPI Comments: Shanara Daube is a 70 y.o. female with a PMHx of GERD, HTN, DM2, seizure disorder, and migraines, who presents to the Emergency Department complaining of gradual onset, aching, diffuse HA x 1 year, worsening yesterday. Pt additionally reports associated light-headedness, dizziness, and right-sided neck pain x 1 day, and numbness to her distal finger tips x 1 week. She additionally states that her head has become more tender and sensitive, which she noticed while combing her hair ~2 days ago. Pt has been followed by her PCP in that past for this pain, and was prescribed Tramadol for her recurrent HA. She has been taking Advil and Tramadol for her current pain with minimal relief. She states that she is seen by [facility] Neurology department in the past, but has not f/u w/ them for ~1 year. Her last CT Head was in 2015. She denies any recent medication/activity/life stress changes. NKDA.  Denies emesis, diarrhea, chills, fever, weakness, or any other associated symptoms.   Past Medical History:  Diagnosis Date  . Arthritis    "all over my body" (12/28/2012)  . Chronic bronchitis ([place])    "get it q yr" (12/28/2012)  . Chronic lower back pain   . Enlarged heart   . GERD (gastroesophageal reflux disease)   . [place] mal seizure [facility]) ~ 1962   "last sz was 08/2012; long time before that" (12/28/2012)  . [id number])    "maybe 2/month" (12/28/2012)  . Hypertension   . OSA on CPAP   . Type II diabetes mellitus [facility])    Patient Active Problem List   Diagnosis Date Noted  . Seizure disorder, grand mal ([place]) 09/20/2014  . Migraine 03/14/2014  . Abnormality of gait 11/29/2013  . HTN (hypertension) 12/28/2012  . OSA on CPAP 12/28/2012  . Back pain 06/19/2011  . Diabetes ([place]) 06/19/2011  . Seizure disorder ([place]) 06/19/2011   Past Surgical History:  Procedure Laterality Date  . ABDOMINAL HYSTERECTOMY  1970's  . CATARACT EXTRACTION W/ INTRAOCULAR LENS  IMPLANT, BILATERAL Bilateral ~ 2012  . DILATION AND CURETTAGE OF UTERUS  1970's  . LEFT HEART CATHETERIZATION WITH CORONARY ANGIOGRAM N/A 03/02/2013   Procedure: LEFT HEART CATHETERIZATION WITH CORONARY ANGIOGRAM;  Surgeon: Laverda Page, MD;  Location: [facility] CATH LAB;  Service: Cardiovascular;  Laterality: N/A;   OB History    No data available     Home Medications    Prior to Admission medications   Medication Sig Start Date End Date Taking? Authorizing Provider  albuterol (PROVENTIL HFA;VENTOLIN HFA) 108 (90 BASE) MCG/ACT inhaler Inhale 2 puffs into the lungs every 6 (six) hours as needed for wheezing.    Historical Provider, MD  aspirin 81 MG chewable tablet Chew 81 mg by mouth daily.    Historical Provider, MD  cholecalciferol (VITAMIN D) 1000 UNITS tablet Take 1 tablet (1,000 Units total) by mouth daily. 12/31/12   Sheila Oats, MD  dorzolamide (TRUSOPT) 2 % ophthalmic solution Place 1 drop into both eyes 3 (three) times daily.    Historical Provider, MD  gabapentin (NEURONTIN) 300 MG capsule Take 300 mg by mouth 3 (three) times daily.    Historical Provider, MD  hydrochlorothiazide (HYDRODIURIL) 25 MG tablet Take 25 mg by mouth daily.      Historical Provider, MD  HYDROcodone-acetaminophen (NORCO) 5-325 MG per tablet Take 1-2 tablets by mouth every 6 (six) hours as needed (for pain). 02/17/14   John Molpus, MD  Insulin Detemir (LEVEMIR  FLEXTOUCH) 100 UNIT/ML Pen Inject into the skin. 03/14/15 03/13/16  Historical Provider, MD  levETIRAcetam (KEPPRA) 750 MG tablet Take 1 tablet (750 mg total) by mouth 2 (two) times daily. 09/13/15   Dennie Bible, NP  Liraglutide (VICTOZA) 18 MG/3ML SOPN Inject 1.8 mg into the skin. 04/03/15   Historical Provider, MD  losartan (COZAAR) 50 MG tablet Take 50 mg by mouth daily.    Historical Provider, MD  ranitidine (ZANTAC) 150 MG tablet Take 150 mg by mouth 2 (two) times daily.      Historical Provider, MD  topiramate (TOPAMAX) 200 MG tablet Take 1 tablet by mouth two  times daily 09/13/15   Dennie Bible, NP  traMADol (ULTRAM) 50 MG tablet Take 1 tablet (50 mg total) by mouth every 6 (six) hours as needed. 09/13/15   Dennie Bible, NP   Family History Family History  Problem Relation Age of Onset  . Diabetes type II Sister   . Coronary artery disease Sister   . Seizures Sister   . Seizures Daughter    Social History Social History  Substance Use Topics  . Smoking status: Never Smoker  . Smokeless tobacco: Never Used  . Alcohol use No   Allergies   Review of patient's allergies indicates no known allergies.  Review of Systems Review of Systems  Constitutional: Negative for chills and fever.  Gastrointestinal: Negative for diarrhea.  Musculoskeletal: Positive for neck pain (right).  Neurological: Positive for dizziness, light-headedness, numbness and headaches. Negative for weakness.  All other systems reviewed and are negative.  Physical Exam Updated Vital Signs BP 143/75   Pulse 68   Temp 98.7 F (37.1 C) (Oral)   Resp 20   Ht 5\' 1"  (1.549 m)   Wt 213 lb (96.6 kg)   BMI 40.25 kg/m   Physical Exam  Constitutional: She is oriented to person, place, and time. She appears well-developed and well-nourished.  HENT:  Head: Normocephalic and atraumatic.  No nuchal rigidity.   Eyes: Conjunctivae and EOM are normal. Pupils are equal, round, and reactive to  light.  Neck: Neck supple.  No carotid bruits. Mild tenderness to R cervical paraspinal muscle   Cardiovascular: Normal rate.   Pulmonary/Chest: Effort normal. No respiratory distress.  Abdominal: She exhibits no distension.  Musculoskeletal: Normal range of motion.  Neurological: She is alert and oriented to person, place, and time. She has normal strength. No cranial nerve deficit or sensory deficit. GCS eye subscore is 4. GCS verbal subscore is 5. GCS motor subscore is 6.  Cranial nerves 2-12 grossly intact. Negative pronator drift. Negative Romberg's testing. Equal strength x all 4 extremities. Slow and unsteady gait, baseline for patient.  Skin: Skin is warm and dry.  Psychiatric: She has a normal mood and affect. Her behavior is normal.  Nursing note and vitals reviewed.  ED Treatments / Results  DIAGNOSTIC STUDIES: Oxygen Saturation is 98% on RA, normal by my interpretation.  COORDINATION OF CARE: 4:35 -Discussed next steps with pt including CT Head. Pt verbalized understanding and is agreeable with the plan.   Labs (all labs ordered are listed, but only abnormal results are displayed) Labs Reviewed  BASIC METABOLIC PANEL - Abnormal; Notable for the following:       Result Value   Potassium 3.1 (*)    Glucose, Bld 128 (*)    Calcium 8.8 (*)    All other components within normal limits  CBC WITH DIFFERENTIAL/PLATELET  CBC WITH DIFFERENTIAL/PLATELET   EKG  EKG Interpretation  Date/Time:  Tuesday January 09 2016 16:16:32 EDT Ventricular Rate:  67 PR Interval:    QRS Duration: 98 QT Interval:  425 QTC Calculation: 449 R Axis:   42 Text Interpretation:  Sinus rhythm No significant change since last tracing Confirmed by FLOYD MD, DANIEL [phone]) on 01/09/2016 4:19:04 PM      Radiology Ct Angio Head W Or Wo Contrast  Result Date: 01/09/2016 CLINICAL DATA:  70 year old hypertensive diabetic female with worsening right sided head and neck pain. Headaches for over year.  Dizziness and weakness. Initial encounter. EXAM: CT ANGIOGRAPHY HEAD AND NECK TECHNIQUE: Multidetector CT imaging of the head and neck was performed using the standard protocol during bolus administration of intravenous contrast. Multiplanar CT image reconstructions and MIPs were obtained to evaluate the vascular anatomy. Carotid stenosis measurements (when applicable) are obtained utilizing NASCET criteria, using the distal internal carotid diameter as the denominator. CONTRAST:  100 cc Isovue 370. COMPARISON:  No prior exams available for direct review. There is a report of a prior head CT from 06/24/2013. FINDINGS: CT HEAD Brain: No intracranial hemorrhage or CT evidence of large acute infarct. No intracranial mass or abnormal enhancement. No hydrocephalus. Calvarium and skull base: No acute abnormality. Paranasal sinuses: Clear. Carotid artery projects into the right sphenoid sinus. Orbits: Post lens replacement without acute abnormality. CTA NECK Aortic arch: Common origin innominate and left common carotid artery. Ascending aorta slightly prominent measuring up to 3.9 cm. Right carotid system: No significant stenosis Left carotid system: No significant stenosis Vertebral arteries:No significant stenosis Skeleton: Multilevel cervical spondylotic changes with various degrees of spinal stenosis and foraminal narrowing. Other neck: No worrisome neck mass bold all lung mass. CTA HEAD Anterior circulation: Slight ectasia right internal carotid artery cavernous segment. Fetal contribution to the posterior cerebral arteries bilaterally. Anterior circulation without medium or large size vessel significant stenosis or occlusion. No aneurysm noted. Posterior circulation: Small caliber vertebral arteries and basilar artery with narrowing and irregularity of the basilar artery. The basilar artery may be a small secondary to fetal origin of the posterior cerebral arteries however, superimposed atherosclerotic changes may  also be present. Venous sinuses: The dominant drainage is to the right. Left sigmoid sinus and jugular bulb cannot be confirmed as patent and possibly occluded. Anatomic variants: As above. Delayed phase: As above. IMPRESSION: CT HEAD No intracranial hemorrhage or CT evidence of large acute infarct. No intracranial mass or abnormal enhancement. CTA NECK Common origin innominate and left common carotid artery. Ascending aorta slightly prominent measuring up to 3.9 cm. No significant stenosis of either carotid artery or vertebral artery. Multilevel cervical spondylotic changes with various degrees of spinal stenosis and foraminal narrowing. CTA HEAD Slight ectasia right internal carotid artery cavernous segment. Fetal contribution to the posterior cerebral arteries bilaterally. Anterior circulation without medium or large size vessel significant stenosis or occlusion. Small caliber vertebral arteries and basilar artery with narrowing and irregularity of the basilar artery. The  basilar artery may be a small secondary to fetal origin of the posterior cerebral arteries however, superimposed atherosclerotic changes may also be present. The dominant venous drainage is to the right. Left sigmoid sinus and jugular bulb cannot be confirmed as patent and possibly occluded. Electronically Signed   By: Genia Del M.D.   On: 01/09/2016 19:30   Ct Angio Neck W And/or Wo Contrast  Result Date: 01/09/2016 CLINICAL DATA:  70 year old hypertensive diabetic female with worsening right sided head and neck pain. Headaches for over year. Dizziness and weakness. Initial encounter. EXAM: CT ANGIOGRAPHY HEAD AND NECK TECHNIQUE: Multidetector CT imaging of the head and neck was performed using the standard protocol during bolus administration of intravenous contrast. Multiplanar CT image reconstructions and MIPs were obtained to evaluate the vascular anatomy. Carotid stenosis measurements (when applicable) are obtained utilizing NASCET  criteria, using the distal internal carotid diameter as the denominator. CONTRAST:  100 cc Isovue 370. COMPARISON:  No prior exams available for direct review. There is a report of a prior head CT from 06/24/2013. FINDINGS: CT HEAD Brain: No intracranial hemorrhage or CT evidence of large acute infarct. No intracranial mass or abnormal enhancement. No hydrocephalus. Calvarium and skull base: No acute abnormality. Paranasal sinuses: Clear. Carotid artery projects into the right sphenoid sinus. Orbits: Post lens replacement without acute abnormality. CTA NECK Aortic arch: Common origin innominate and left common carotid artery. Ascending aorta slightly prominent measuring up to 3.9 cm. Right carotid system: No significant stenosis Left carotid system: No significant stenosis Vertebral arteries:No significant stenosis Skeleton: Multilevel cervical spondylotic changes with various degrees of spinal stenosis and foraminal narrowing. Other neck: No worrisome neck mass bold all lung mass. CTA HEAD Anterior circulation: Slight ectasia right internal carotid artery cavernous segment. Fetal contribution to the posterior cerebral arteries bilaterally. Anterior circulation without medium or large size vessel significant stenosis or occlusion. No aneurysm noted. Posterior circulation: Small caliber vertebral arteries and basilar artery with narrowing and irregularity of the basilar artery. The basilar artery may be a small secondary to fetal origin of the posterior cerebral arteries however, superimposed atherosclerotic changes may also be present. Venous sinuses: The dominant drainage is to the right. Left sigmoid sinus and jugular bulb cannot be confirmed as patent and possibly occluded. Anatomic variants: As above. Delayed phase: As above. IMPRESSION: CT HEAD No intracranial hemorrhage or CT evidence of large acute infarct. No intracranial mass or abnormal enhancement. CTA NECK Common origin innominate and left common carotid  artery. Ascending aorta slightly prominent measuring up to 3.9 cm. No significant stenosis of either carotid artery or vertebral artery. Multilevel cervical spondylotic changes with various degrees of spinal stenosis and foraminal narrowing. CTA HEAD Slight ectasia right internal carotid artery cavernous segment. Fetal contribution to the posterior cerebral arteries bilaterally. Anterior circulation without medium or large size vessel significant stenosis or occlusion. Small caliber vertebral arteries and basilar artery with narrowing and irregularity of the basilar artery. The basilar artery may be a small secondary to fetal origin of the posterior cerebral arteries however, superimposed atherosclerotic changes may also be present. The dominant venous drainage is to the right. Left sigmoid sinus and jugular bulb cannot be confirmed as patent and possibly occluded. Electronically Signed   By: Genia Del M.D.   On: 01/09/2016 19:30   Procedures Procedures (including critical care time)  Medications Ordered in ED Medications  sodium chloride 0.9 % bolus 1,000 mL (1,000 mLs Intravenous New Bag/Given 01/09/16 1708)  metoCLOPramide (REGLAN) injection 10 mg (10  mg Intravenous Given 01/09/16 1709)  diphenhydrAMINE (BENADRYL) injection 25 mg (25 mg Intravenous Given 01/09/16 1708)  iopamidol (ISOVUE-370) 76 % injection 100 mL (100 mLs Intravenous Contrast Given 01/09/16 1835)   Initial Impression / Assessment and Plan / ED Course  I have reviewed the triage vital signs and the nursing notes.  Pertinent labs & imaging results that were available during my care of the patient were reviewed by me and considered in my medical decision making (see chart for details).  Clinical Course   BP 126/66   Pulse 66   Temp 98.7 F (37.1 C) (Oral)   Resp 15   Ht 5\' 1"  (1.549 m)   Wt 96.6 kg   SpO2 98%   BMI 40.25 kg/m    Pt HA treated and improved while in ED. Presentation is like pts typical HA and non concerning  for [facility], ICH, Meningitis, vertebral artery dissection, or temporal arteritis. Pt is afebrile with no focal neuro deficits, nuchal rigidity, or change in vision. Head and neck CTA ordered to r/o anneurysm or dissection and the result is without acute changes. Care discussed with Dr. Tyrone Nine. Pt is to follow up with PCP to discuss prophylactic medication. Pt verbalizes understanding and is agreeable with plan to dc. Mild hypokalemia, supplementation given.  Encourage eating banana to help replenish K+  Final Clinical Impressions(s) / ED Diagnoses   Final diagnoses:  Chronic nonintractable headache, unspecified headache type    New Prescriptions New Prescriptions   BUTALBITAL-ACETAMINOPHEN-CAFFEINE (FIORICET) 50-325-40 MG TABLET    Take 1 tablet by mouth every 8 (eight) hours as needed for headache or migraine.   I personally performed the services described in this documentation, which was scribed in my presence. The recorded information has been reviewed and is accurate.       Domenic Moras, PA-C 01/09/16 [place], DO 01/09/16 2046

## 2016-01-09 NOTE — ED Notes (Signed)
PA at bedside.

## 2016-01-10 ENCOUNTER — Other Ambulatory Visit: Payer: Self-pay | Admitting: Internal Medicine

## 2016-01-10 DIAGNOSIS — E2839 Other primary ovarian failure: Secondary | ICD-10-CM

## 2016-01-11 ENCOUNTER — Telehealth (HOSPITAL_BASED_OUTPATIENT_CLINIC_OR_DEPARTMENT_OTHER): Payer: Self-pay | Admitting: Emergency Medicine

## 2016-01-12 ENCOUNTER — Ambulatory Visit
Admission: RE | Admit: 2016-01-12 | Discharge: 2016-01-12 | Disposition: A | Discharge status: Home / Self Care | Payer: Medicare Other | Source: Ambulatory Visit | Attending: Internal Medicine | Admitting: Internal Medicine

## 2016-01-12 ENCOUNTER — Other Ambulatory Visit: Payer: Medicare Other

## 2016-01-12 DIAGNOSIS — Z13820 Encounter for screening for osteoporosis: Secondary | ICD-10-CM | POA: Diagnosis not present

## 2016-01-12 DIAGNOSIS — Z780 Asymptomatic menopausal state: Secondary | ICD-10-CM | POA: Diagnosis not present

## 2016-01-12 DIAGNOSIS — E2839 Other primary ovarian failure: Secondary | ICD-10-CM

## 2016-01-15 DIAGNOSIS — I10 Essential (primary) hypertension: Secondary | ICD-10-CM | POA: Diagnosis not present

## 2016-01-15 DIAGNOSIS — E1140 Type 2 diabetes mellitus with diabetic neuropathy, unspecified: Secondary | ICD-10-CM | POA: Diagnosis not present

## 2016-01-22 DIAGNOSIS — M545 Low back pain: Secondary | ICD-10-CM | POA: Diagnosis not present

## 2016-01-22 DIAGNOSIS — G43909 Migraine, unspecified, not intractable, without status migrainosus: Secondary | ICD-10-CM | POA: Diagnosis not present

## 2016-01-22 DIAGNOSIS — E1140 Type 2 diabetes mellitus with diabetic neuropathy, unspecified: Secondary | ICD-10-CM | POA: Diagnosis not present

## 2016-01-22 DIAGNOSIS — I10 Essential (primary) hypertension: Secondary | ICD-10-CM | POA: Diagnosis not present

## 2016-01-22 DIAGNOSIS — E119 Type 2 diabetes mellitus without complications: Secondary | ICD-10-CM | POA: Diagnosis not present

## 2016-01-23 DIAGNOSIS — Z79899 Other long term (current) drug therapy: Secondary | ICD-10-CM | POA: Diagnosis not present

## 2016-01-23 DIAGNOSIS — Z961 Presence of intraocular lens: Secondary | ICD-10-CM | POA: Diagnosis not present

## 2016-01-23 DIAGNOSIS — Z9841 Cataract extraction status, right eye: Secondary | ICD-10-CM | POA: Diagnosis not present

## 2016-01-23 DIAGNOSIS — G4733 Obstructive sleep apnea (adult) (pediatric): Secondary | ICD-10-CM | POA: Diagnosis not present

## 2016-01-23 DIAGNOSIS — Z7982 Long term (current) use of aspirin: Secondary | ICD-10-CM | POA: Diagnosis not present

## 2016-01-23 DIAGNOSIS — Z794 Long term (current) use of insulin: Secondary | ICD-10-CM | POA: Diagnosis not present

## 2016-01-23 DIAGNOSIS — E113213 Type 2 diabetes mellitus with mild nonproliferative diabetic retinopathy with macular edema, bilateral: Secondary | ICD-10-CM | POA: Diagnosis not present

## 2016-01-23 DIAGNOSIS — E113313 Type 2 diabetes mellitus with moderate nonproliferative diabetic retinopathy with macular edema, bilateral: Secondary | ICD-10-CM | POA: Diagnosis not present

## 2016-01-23 DIAGNOSIS — H0289 Other specified disorders of eyelid: Secondary | ICD-10-CM | POA: Diagnosis not present

## 2016-01-23 DIAGNOSIS — H401132 Primary open-angle glaucoma, bilateral, moderate stage: Secondary | ICD-10-CM | POA: Diagnosis not present

## 2016-01-23 DIAGNOSIS — I10 Essential (primary) hypertension: Secondary | ICD-10-CM | POA: Diagnosis not present

## 2016-01-23 DIAGNOSIS — Z9842 Cataract extraction status, left eye: Secondary | ICD-10-CM | POA: Diagnosis not present

## 2016-01-24 DIAGNOSIS — E119 Type 2 diabetes mellitus without complications: Secondary | ICD-10-CM | POA: Diagnosis not present

## 2016-01-24 DIAGNOSIS — E559 Vitamin D deficiency, unspecified: Secondary | ICD-10-CM | POA: Diagnosis not present

## 2016-01-24 DIAGNOSIS — Z794 Long term (current) use of insulin: Secondary | ICD-10-CM | POA: Diagnosis not present

## 2016-01-26 DIAGNOSIS — E559 Vitamin D deficiency, unspecified: Secondary | ICD-10-CM | POA: Diagnosis not present

## 2016-01-26 DIAGNOSIS — E119 Type 2 diabetes mellitus without complications: Secondary | ICD-10-CM | POA: Diagnosis not present

## 2016-01-26 DIAGNOSIS — G4733 Obstructive sleep apnea (adult) (pediatric): Secondary | ICD-10-CM | POA: Diagnosis not present

## 2016-01-26 DIAGNOSIS — I10 Essential (primary) hypertension: Secondary | ICD-10-CM | POA: Diagnosis not present

## 2016-01-30 ENCOUNTER — Encounter: Payer: Self-pay | Admitting: Neurology

## 2016-01-30 ENCOUNTER — Ambulatory Visit (INDEPENDENT_AMBULATORY_CARE_PROVIDER_SITE_OTHER): Payer: Medicare Other | Admitting: Neurology

## 2016-01-30 ENCOUNTER — Telehealth: Payer: Self-pay | Admitting: Neurology

## 2016-01-30 VITALS — BP 128/66 | HR 67 | Ht 61.0 in | Wt 212.5 lb

## 2016-01-30 DIAGNOSIS — R269 Unspecified abnormalities of gait and mobility: Secondary | ICD-10-CM

## 2016-01-30 DIAGNOSIS — G43009 Migraine without aura, not intractable, without status migrainosus: Secondary | ICD-10-CM | POA: Diagnosis not present

## 2016-01-30 DIAGNOSIS — M545 Low back pain, unspecified: Secondary | ICD-10-CM

## 2016-01-30 DIAGNOSIS — G40909 Epilepsy, unspecified, not intractable, without status epilepticus: Secondary | ICD-10-CM | POA: Diagnosis not present

## 2016-01-30 MED ORDER — ONDANSETRON 4 MG PO TBDP: 4.0000 mg | ORAL_TABLET | Freq: Three times a day (TID) | ORAL | 6 refills | Status: DC | PRN

## 2016-01-30 MED ORDER — SUMATRIPTAN SUCCINATE 25 MG PO TABS: 25.0000 mg | ORAL_TABLET | ORAL | 1 refills | Status: DC | PRN

## 2016-01-30 MED ORDER — ONDANSETRON 4 MG PO TBDP: 4.0000 mg | ORAL_TABLET | Freq: Three times a day (TID) | ORAL | 6 refills | Status: AC | PRN

## 2016-01-30 MED ORDER — PROPRANOLOL HCL 40 MG PO TABS: 40.0000 mg | ORAL_TABLET | Freq: Two times a day (BID) | ORAL | 11 refills | Status: DC

## 2016-01-30 MED ORDER — PROPRANOLOL HCL 40 MG PO TABS: 40.0000 mg | ORAL_TABLET | Freq: Two times a day (BID) | ORAL | 11 refills | Status: AC

## 2016-01-30 NOTE — Progress Notes (Signed)
[facility]  PATIENT: [name] [name] DOB: [birth year]  HISTORY OF PRESENT ILLNESS: HISTORY: Ranell Claudio is a 70 year old right-handed African American female, accompanied by her daughter [name] referred by her primary care physician [name] for evaluation of seizure, abnormal MRI of brain, she was recently evaluated by [facility] Neurologist. [name]. She had a history of seizure since age 22, generalized tonic-clonic seizure, no warning signs, initially she was treated with Dilantin and phenobarbital, recent 10 years, she had been treated with titrating dose of Topamax 100 +200 mg twice a day, Keppra 750 mg twice a day, she denies significant side effects. Most recent seizure was in April 2015, she also had recent MRI of the brain, was told that she had small vessel disease, but per patient, there was no change in treatment plan, She also complains of few years history of rapid onset gait difficulty, occasional urinary incontinence, she has past medical history of obesity, insulin-dependent diabetes, hypertension. She has never driven, live at home with her husband,  We have reviewed MRI of the brain from March 2015, from [facility], moderate periventricular white matter disease, no acute lesions. EEG was normal Topamax level was 15.6, Keppra level was 8:6, while taking Topamax 100 plus 200 mg twice a day, Keppra 750 mg twice a day, she is tolerating the medication well, there is no significant side effect.  Update January 30 2016: Her seizure is under good control with current dose of Keppra 750 mg twice a day, Topamax 200/200 mg last a seizure was in April 2015,  She also complains of chronic neck pain, low back pain, unsteady gait, I have personally reviewed MRI of lumbar August 2015 showed multilevel degenerative disc disease, L4-5 severe facet arthropathy, with fluid in the facet joint, with mild-to-moderate foraminal stenosis there was no  evidence of canal stenosis. MRI of cervical spine, multilevel degenerative disc disease, variable degree of foraminal stenosis at C 4 5, C5-6, no evidence of cord signal abnormality. No cord compression.   She reported long history of migraine headaches, gradually getting worse, now has migraines about 3-4 times each week, is associated nauseous, lasting all day long, with light noise sensitivity. She has tried Fioricet, tramadol, with limited help,  REVIEW OF SYSTEMS: Full 14 system review of systems performed and notable only for those listed, all others are neg:  Bruise easily   ALLERGIES: No Known Allergies  HOME MEDICATIONS: Outpatient Medications Prior to Visit  Medication Sig Dispense Refill  . albuterol (PROVENTIL HFA;VENTOLIN HFA) 108 (90 BASE) MCG/ACT inhaler Inhale 2 puffs into the lungs every 6 (six) hours as needed for wheezing.    Marland Kitchen aspirin 81 MG chewable tablet Chew 81 mg by mouth daily.    . butalbital-acetaminophen-caffeine (FIORICET) 50-325-40 MG tablet Take 1 tablet by mouth every 8 (eight) hours as needed for headache or migraine. 20 tablet 0  . cholecalciferol (VITAMIN D) 1000 UNITS tablet Take 1 tablet (1,000 Units total) by mouth daily.    . dorzolamide (TRUSOPT) 2 % ophthalmic solution Place 1 drop into both eyes 3 (three) times daily.    Marland Kitchen gabapentin (NEURONTIN) 300 MG capsule Take 300 mg by mouth 3 (three) times daily.    . hydrochlorothiazide (HYDRODIURIL) 25 MG tablet Take 25 mg by mouth daily.      . Insulin Detemir (LEVEMIR FLEXTOUCH) 100 UNIT/ML Pen Inject into the skin.    Marland Kitchen levETIRAcetam (KEPPRA) 750 MG tablet Take 1 tablet (750 mg total) by mouth 2 (two)  times daily. 180 tablet 3  . Liraglutide (VICTOZA) 18 MG/3ML SOPN Inject 1.8 mg into the skin.    Marland Kitchen losartan (COZAAR) 50 MG tablet Take 50 mg by mouth daily.    . ranitidine (ZANTAC) 150 MG tablet Take 150 mg by mouth 2 (two) times daily.      Marland Kitchen topiramate (TOPAMAX) 200 MG tablet Take 1 tablet by mouth two   times daily 180 tablet 2  . traMADol (ULTRAM) 50 MG tablet Take 1 tablet (50 mg total) by mouth every 6 (six) hours as needed. 45 tablet 5  . HYDROcodone-acetaminophen (NORCO) 5-325 MG per tablet Take 1-2 tablets by mouth every 6 (six) hours as needed (for pain). 10 tablet 0   No facility-administered medications prior to visit.     PAST MEDICAL HISTORY: Past Medical History:  Diagnosis Date  . Arthritis    "all over my body" (12/28/2012)  . Chronic bronchitis ([place])    "get it q yr" (12/28/2012)  . Chronic lower back pain   . Enlarged heart   . GERD (gastroesophageal reflux disease)   . [place] mal seizure [facility]) ~ 1962   "last sz was 08/2012; long time before that" (12/28/2012)  . [id number]    "maybe 2/month" (12/28/2012)  . Hypertension   . OSA on CPAP   . Type II diabetes mellitus ([place])     PAST SURGICAL HISTORY: Past Surgical History:  Procedure Laterality Date  . ABDOMINAL HYSTERECTOMY  1970's  . CATARACT EXTRACTION W/ INTRAOCULAR LENS  IMPLANT, BILATERAL Bilateral ~ 2012  . DILATION AND CURETTAGE OF UTERUS  1970's  . LEFT HEART CATHETERIZATION WITH CORONARY ANGIOGRAM N/A 03/02/2013   Procedure: LEFT HEART CATHETERIZATION WITH CORONARY ANGIOGRAM;  Surgeon: Laverda Page, MD;  Location: [facility] CATH LAB;  Service: Cardiovascular;  Laterality: N/A;    FAMILY HISTORY: Family History  Problem Relation Age of Onset  . Diabetes type II Sister   . Coronary artery disease Sister   . Seizures Sister   . Seizures Daughter     SOCIAL HISTORY: Social History   Social History  . Marital status: Married    Spouse name: N/A  . Number of children: N/A  . Years of education: N/A   Occupational History  . Not on file.   Social History Main Topics  . Smoking status: Never Smoker  . Smokeless tobacco: Never Used  . Alcohol use No  . Drug use: No  . Sexual activity: Not on file   Other Topics Concern  . Not on file   Social History Narrative  . No narrative on file      PHYSICAL EXAM  Vitals:   01/30/16 0824  BP: 128/66  Pulse: 67  Weight: 212 lb 8 oz (96.4 kg)  Height: 5\' 1"  (1.549 m)   Body mass index is 40.15 kg/m. Generalized: Well developed, obese female in no acute distress  Head: normocephalic and atraumatic,. Oropharynx benign  Neck: Supple, no carotid bruits  Cardiac: Regular rate rhythm, no murmur  Musculoskeletal: No deformity   Neurological examination   Mentation: Alert oriented to time, place, history taking. Follows all commands speech and language fluent Cranial nerve II-XII: Pupils were equal round reactive to light extraocular movements were full, visual field were full on confrontational test. Facial sensation and strength were normal. hearing was intact to finger rubbing bilaterally. Uvula tongue midline. head turning and shoulder shrug were normal and symmetric.Tongue protrusion into cheek strength was normal. Motor: normal bulk and tone, full strength  in the BUE, BLE, fine finger movements normal, no pronator drift. No focal weakness Sensory: normal and symmetric to light touch, in the upper and lower extremities Coordination: finger-nose-finger, heel-to-shin bilaterally, no dysmetria Gait: wide based, cautious, steady gait, bilateral knee valgus, no assistive device Reflexes: Brachioradialis 2/2, biceps 2/2, triceps 2/2, patellar 3/3, Achilles 2/2, plantar responses were flexor bilaterally.  DIAGNOSTIC DATA (LABS, IMAGING, TESTING) - I reviewed patient records, labs, notes, testing and imaging myself where available.  Lab Results  Component Value Date   WBC 9.7 01/09/2016   HGB 12.4 01/09/2016   HCT 37.9 01/09/2016   MCV 89.2 01/09/2016   PLT 258 01/09/2016      Component Value Date/Time   NA 139 01/09/2016 1750   K 3.1 (L) 01/09/2016 1750   CL 102 01/09/2016 1750   CO2 30 01/09/2016 1750   GLUCOSE 128 (H) 01/09/2016 1750   BUN 16 01/09/2016 1750   CREATININE 0.89 01/09/2016 1750   CALCIUM 8.8 (L)  01/09/2016 1750   PROT 7.6 02/17/2014 0004   ALBUMIN 3.5 02/17/2014 0004   AST 18 02/17/2014 0004   ALT 13 02/17/2014 0004   ALKPHOS 94 02/17/2014 0004   BILITOT <0.2 (L) 02/17/2014 0004   GFRNONAA >60 01/09/2016 1750   GFRAA >60 01/09/2016 1750       ASSESSMENT AND PLAN  70 y.o. year old female   Chronic migraine headaches, progressive worsening  She has tried and failed preventive medications Topamax,  Abortive treatment tramadol, Fioricet provides limited help  Add on preventive medication  BOTOX as migraine prevention  Seizure  Keep current dose of Topamax 200 mg twice a day  Keppra 750 mg twice a day  Last seizure was in April 2015, if she continued to do well, may consider tapering off Keppra   Marcial Pacas, M.D. Ph.D.  [facility] [place], [place] [address] Phone: [phone] Fax:      [phone]

## 2016-01-30 NOTE — Telephone Encounter (Signed)
Spoke to patient - mail order is filling all three medications for her (Zofran, Inderal, Imitrex). She understands what each medication is for and how to take them.

## 2016-01-30 NOTE — Telephone Encounter (Signed)
Patient called, was just seen by Dr. Krista Blue, [facility] has advised her it's too soon to fill Rx for SUMAtriptan (IMITREX) 25 MG tablet and if they fill this, our office would need to contact insurance company. Patient states she's never taken this medication, the only thing she can figure is that she initially requested Rx to be sent to [facility] then changed it to [facility]. Patient also states, pharmacy advised her "there are 2 more Rx's besides this one and patient wants to know what the 2 prescriptions are?"

## 2016-02-08 ENCOUNTER — Telehealth: Payer: Self-pay | Admitting: Neurology

## 2016-02-08 NOTE — Telephone Encounter (Signed)
Called [facility] and they relayed for New Enrollment they would need new enrollment form fax and RX for Botox. [place] fax # [phone] telephone. [phone]. Thanks Hinton Dyer.

## 2016-02-13 ENCOUNTER — Telehealth: Payer: Self-pay | Admitting: Neurology

## 2016-02-13 NOTE — Telephone Encounter (Signed)
[name] [name]/[employer] called requesting to know whether or not Prior Josem Kaufmann has been obtained for patient's BOTOX, has Botox appointment Thursday September 7th please call patient to advise.

## 2016-02-14 NOTE — Telephone Encounter (Signed)
Called [facility] and they are still waiting on Enrollment Form Called Patient and gave her an update. Relayed to her I will call her this Friday with another up date. Patient's apt has been CX until we get a approval. Thanks Hinton Dyer.  Danielle see phone note below to fax enrollment form again to them.

## 2016-02-15 ENCOUNTER — Ambulatory Visit: Payer: Medicare Other | Admitting: Neurology

## 2016-02-16 NOTE — Telephone Encounter (Signed)
[facility]ed [facility] they relay they still don't have enrollment form try this fax number [phone].

## 2016-02-21 NOTE — Telephone Encounter (Signed)
Called and spoke to [facility] and they received enrollment Form . Please follow back up on Friday 02/23/2016. Order is still processing for Toxin. Thanks Hinton Dyer.

## 2016-02-29 ENCOUNTER — Other Ambulatory Visit: Payer: Self-pay | Admitting: *Deleted

## 2016-02-29 ENCOUNTER — Telehealth: Payer: Self-pay | Admitting: Neurology

## 2016-02-29 MED ORDER — SUMATRIPTAN SUCCINATE 25 MG PO TABS: ORAL_TABLET | ORAL | 3 refills | Status: AC

## 2016-02-29 NOTE — Telephone Encounter (Signed)
[name] [name]/[name] [name] [phone] x [phone] works with pt's PCP called [facility] pt is confused, said she was already taking losartan (COZAAR) 50 MG tablet by PCP Benito Mccreedy, metropolol 50mg  and hydroand Dr Krista Blue prescribed  propranolol (INDERAL) 40 MG tablet ,hydrochlorothiazide (HYDRODIURIL) 25 MG tablet  Does she continue to take all 4. [name] [name] said the pt has complaoined of some dizziness.     She also needs refill on SUMAtriptan (IMITREX) 25 MG tablet  pls send to [facility]

## 2016-02-29 NOTE — Telephone Encounter (Signed)
Spoke to both [place] and patient - per Dr. Krista Blue, she would like her to take propranolol 40mg , BID - ok to continue losartan and hydrochlorothiazide.  She should discontinue metoprolol.  The patient pulled her medication bag out while on the phone with me and read off the names from the bottles.  She left the three mentioned medications in her bag.  She verbalized understanding of these instructions.  Also, her sumatriptan rx has been sent to [facility].

## 2016-02-29 NOTE — Telephone Encounter (Signed)
Left message for a return call

## 2016-03-06 NOTE — Telephone Encounter (Signed)
Patient is scheduled for First Botox Injection. Spoke to Patient she is aware.

## 2016-03-07 ENCOUNTER — Telehealth: Payer: Self-pay | Admitting: Neurology

## 2016-03-07 DIAGNOSIS — Z23 Encounter for immunization: Secondary | ICD-10-CM | POA: Diagnosis not present

## 2016-03-07 DIAGNOSIS — G43909 Migraine, unspecified, not intractable, without status migrainosus: Secondary | ICD-10-CM | POA: Diagnosis not present

## 2016-03-07 DIAGNOSIS — I10 Essential (primary) hypertension: Secondary | ICD-10-CM | POA: Diagnosis not present

## 2016-03-07 DIAGNOSIS — E119 Type 2 diabetes mellitus without complications: Secondary | ICD-10-CM | POA: Diagnosis not present

## 2016-03-07 DIAGNOSIS — E1140 Type 2 diabetes mellitus with diabetic neuropathy, unspecified: Secondary | ICD-10-CM | POA: Diagnosis not present

## 2016-03-07 DIAGNOSIS — M545 Low back pain: Secondary | ICD-10-CM | POA: Diagnosis not present

## 2016-03-07 NOTE — Telephone Encounter (Signed)
[facility] [phone] called to check status of Prior Auth for BOTOX, please call to advise.

## 2016-03-08 DIAGNOSIS — E1140 Type 2 diabetes mellitus with diabetic neuropathy, unspecified: Secondary | ICD-10-CM | POA: Diagnosis not present

## 2016-03-08 DIAGNOSIS — I10 Essential (primary) hypertension: Secondary | ICD-10-CM | POA: Diagnosis not present

## 2016-03-12 ENCOUNTER — Emergency Department (HOSPITAL_BASED_OUTPATIENT_CLINIC_OR_DEPARTMENT_OTHER): Payer: Medicare Other

## 2016-03-12 ENCOUNTER — Encounter (HOSPITAL_BASED_OUTPATIENT_CLINIC_OR_DEPARTMENT_OTHER): Payer: Self-pay | Admitting: *Deleted

## 2016-03-12 ENCOUNTER — Emergency Department (HOSPITAL_BASED_OUTPATIENT_CLINIC_OR_DEPARTMENT_OTHER)
Admission: EM | Admit: 2016-03-12 | Discharge: 2016-03-12 | Disposition: A | Discharge status: Home / Self Care | Payer: Medicare Other | Attending: Emergency Medicine | Admitting: Emergency Medicine

## 2016-03-12 DIAGNOSIS — R05 Cough: Secondary | ICD-10-CM

## 2016-03-12 DIAGNOSIS — Z79899 Other long term (current) drug therapy: Secondary | ICD-10-CM | POA: Insufficient documentation

## 2016-03-12 DIAGNOSIS — E119 Type 2 diabetes mellitus without complications: Secondary | ICD-10-CM | POA: Insufficient documentation

## 2016-03-12 DIAGNOSIS — Z794 Long term (current) use of insulin: Secondary | ICD-10-CM | POA: Insufficient documentation

## 2016-03-12 DIAGNOSIS — J40 Bronchitis, not specified as acute or chronic: Secondary | ICD-10-CM

## 2016-03-12 DIAGNOSIS — Z7982 Long term (current) use of aspirin: Secondary | ICD-10-CM | POA: Diagnosis not present

## 2016-03-12 DIAGNOSIS — I10 Essential (primary) hypertension: Secondary | ICD-10-CM | POA: Diagnosis not present

## 2016-03-12 DIAGNOSIS — J069 Acute upper respiratory infection, unspecified: Secondary | ICD-10-CM | POA: Insufficient documentation

## 2016-03-12 DIAGNOSIS — R079 Chest pain, unspecified: Secondary | ICD-10-CM | POA: Diagnosis not present

## 2016-03-12 DIAGNOSIS — R059 Cough, unspecified: Secondary | ICD-10-CM

## 2016-03-12 MED ORDER — AZITHROMYCIN 250 MG PO TABS
500.0000 mg | ORAL_TABLET | Freq: Once | ORAL | Status: AC
  Administered 2016-03-12: 500 mg via ORAL
  Filled 2016-03-12: qty 2

## 2016-03-12 MED ORDER — AZITHROMYCIN 250 MG PO TABS: 250.0000 mg | ORAL_TABLET | Freq: Every day | ORAL | 0 refills | Status: DC

## 2016-03-12 MED ORDER — IPRATROPIUM-ALBUTEROL 0.5-2.5 (3) MG/3ML IN SOLN
3.0000 mL | RESPIRATORY_TRACT | Status: DC
  Administered 2016-03-12: 3 mL via RESPIRATORY_TRACT
  Filled 2016-03-12: qty 3

## 2016-03-12 MED ORDER — PREDNISONE 20 MG PO TABS
40.0000 mg | ORAL_TABLET | Freq: Once | ORAL | Status: AC
  Administered 2016-03-12: 40 mg via ORAL
  Filled 2016-03-12: qty 2

## 2016-03-12 MED ORDER — PREDNISONE 20 MG PO TABS: ORAL_TABLET | ORAL | 0 refills | Status: DC

## 2016-03-12 NOTE — ED Triage Notes (Signed)
Pt is here for cough which began Sunday and became more yesterday. Pt reports some wheezing, no fever or chills and no sob.  Pt states that the cough has been productive at times and the phlegm has been green.  All 3 people in her household have the same symptoms.  Pt is in no distress at this time

## 2016-03-12 NOTE — ED Provider Notes (Signed)
[facility]PT [facility] Provider Note   CSN: [id number] Arrival date & time: 03/12/16  2024 By signing my name below, I, Dyke Brackett, attest that this documentation has been prepared under the direction and in the presence of No att. providers found . Electronically Signed: Dyke Brackett, Scribe. 03/12/2016. 9:56 PM.   History   Chief Complaint Chief Complaint  Patient presents with  . Cough   HPI [name] [name] is a 70 y.o. female with hx of chronic bronchitis, HTN, DM, and grand mal seizures who presents to the Emergency Department complaining of intermittent, productive cough onset 2 days ago. Her cough is productive of green phlegm. She notes associated wheezing, rhinorrhea, burning chest pain secondary to cough, and headache. She has experienced this before when she had bronchitis years ago. She states her daughter who lives with her has the same symptoms. Pt denies any leg swelling, neck pain, rashes, postnasal drip, arthralgias, and myalgias.  The history is provided by the patient. No language interpreter was used.   Past Medical History:  Diagnosis Date  . Arthritis    "all over my body" (12/28/2012)  . Chronic bronchitis ([place])    "get it q yr" (12/28/2012)  . Chronic lower back pain   . Enlarged heart   . GERD (gastroesophageal reflux disease)   . [place] mal seizure [facility]) ~ 1962   "last sz was 08/2012; long time before that" (12/28/2012)  . [id number])    "maybe 2/month" (12/28/2012)  . Hypertension   . OSA on CPAP   . Type II diabetes mellitus [facility])     Patient Active Problem List   Diagnosis Date Noted  . Seizure disorder, grand mal ([place]) 09/20/2014  . Migraine 03/14/2014  . Abnormality of gait 11/29/2013  . HTN (hypertension) 12/28/2012  . OSA on CPAP 12/28/2012  . Back pain 06/19/2011  . Diabetes ([place]) 06/19/2011  . Seizure disorder ([place]) 06/19/2011    Past Surgical History:  Procedure Laterality Date  . ABDOMINAL HYSTERECTOMY  1970's  . CATARACT  EXTRACTION W/ INTRAOCULAR LENS  IMPLANT, BILATERAL Bilateral ~ 2012  . DILATION AND CURETTAGE OF UTERUS  1970's  . LEFT HEART CATHETERIZATION WITH CORONARY ANGIOGRAM N/A 03/02/2013   Procedure: LEFT HEART CATHETERIZATION WITH CORONARY ANGIOGRAM;  Surgeon: Laverda Page, MD;  Location: [facility] CATH LAB;  Service: Cardiovascular;  Laterality: N/A;    OB History    No data available     Home Medications    Prior to Admission medications   Medication Sig Start Date End Date Taking? Authorizing Provider  albuterol (PROVENTIL HFA;VENTOLIN HFA) 108 (90 BASE) MCG/ACT inhaler Inhale 2 puffs into the lungs every 6 (six) hours as needed for wheezing.    Historical Provider, MD  aspirin 81 MG chewable tablet Chew 81 mg by mouth daily.    Historical Provider, MD  azithromycin (ZITHROMAX) 250 MG tablet Take 1 tablet (250 mg total) by mouth daily. Take 1 every day until finished. 03/13/16   Merrily Pew, MD  butalbital-acetaminophen-caffeine (FIORICET) 50-325-40 MG tablet Take 1 tablet by mouth every 8 (eight) hours as needed for headache or migraine. 01/09/16 01/08/17  Domenic Moras, PA-C  cholecalciferol (VITAMIN D) 1000 UNITS tablet Take 1 tablet (1,000 Units total) by mouth daily. 12/31/12   Sheila Oats, MD  dorzolamide (TRUSOPT) 2 % ophthalmic solution Place 1 drop into both eyes 3 (three) times daily.    Historical Provider, MD  gabapentin (NEURONTIN) 300 MG capsule Take 300 mg by mouth 3 (three) times daily.  Historical Provider, MD  hydrochlorothiazide (HYDRODIURIL) 25 MG tablet Take 25 mg by mouth daily.      Historical Provider, MD  Insulin Detemir (LEVEMIR FLEXTOUCH) 100 UNIT/ML Pen Inject into the skin. 03/14/15 03/13/16  Historical Provider, MD  levETIRAcetam (KEPPRA) 750 MG tablet Take 1 tablet (750 mg total) by mouth 2 (two) times daily. 09/13/15   Dennie Bible, NP  Liraglutide (VICTOZA) 18 MG/3ML SOPN Inject 1.8 mg into the skin. 04/03/15   Historical Provider, MD  losartan (COZAAR) 50 MG  tablet Take 50 mg by mouth daily.    Historical Provider, MD  ondansetron (ZOFRAN ODT) 4 MG disintegrating tablet Take 1 tablet (4 mg total) by mouth every 8 (eight) hours as needed. 01/30/16   Marcial Pacas, MD  predniSONE (DELTASONE) 20 MG tablet 2 tabs po daily x 4 days 03/12/16   Merrily Pew, MD  propranolol (INDERAL) 40 MG tablet Take 1 tablet (40 mg total) by mouth 2 (two) times daily. 01/30/16   Marcial Pacas, MD  ranitidine (ZANTAC) 150 MG tablet Take 150 mg by mouth 2 (two) times daily.      Historical Provider, MD  SUMAtriptan (IMITREX) 25 MG tablet Take 1 tab at onset of migraine.  May repeat in 2 hrs, prn.  Max: 2 tab/24 hrs - 10 tabs/30 days. 02/29/16   Marcial Pacas, MD  topiramate (TOPAMAX) 200 MG tablet Take 1 tablet by mouth two  times daily 09/13/15   Dennie Bible, NP  traMADol (ULTRAM) 50 MG tablet Take 1 tablet (50 mg total) by mouth every 6 (six) hours as needed. 09/13/15   Dennie Bible, NP    Family History Family History  Problem Relation Age of Onset  . Diabetes type II Sister   . Coronary artery disease Sister   . Seizures Sister   . Seizures Daughter     Social History Social History  Substance Use Topics  . Smoking status: Never Smoker  . Smokeless tobacco: Never Used  . Alcohol use No    Allergies   Review of patient's allergies indicates no known allergies.  Review of Systems Review of Systems  HENT: Positive for rhinorrhea. Negative for postnasal drip.   Respiratory: Positive for cough and wheezing.   Cardiovascular: Positive for chest pain. Negative for leg swelling.  Musculoskeletal: Negative for arthralgias, myalgias and neck pain.  Skin: Negative for rash.  Neurological: Positive for headaches.  All other systems reviewed and are negative.  Physical Exam Updated Vital Signs BP 127/84 (BP Location: Right Arm)   Pulse 108   Temp 98 F (36.7 C) (Oral)   Resp 18   Ht 5\' 3"  (1.6 m)   Wt 211 lb (95.7 kg)   SpO2 97%   BMI 37.38 kg/m    Physical Exam  Constitutional: She is oriented to person, place, and time. She appears well-developed and well-nourished. No distress.  HENT:  Head: Normocephalic and atraumatic.  Mouth/Throat: Oropharynx is clear and moist.  Eyes: Conjunctivae are normal.  Neck:  No anterior or posterior lymphadenopathy   Cardiovascular: Normal rate and regular rhythm.  Exam reveals no gallop and no friction rub.   No murmur heard. Tachycardic in triage, but normal in my exam  Pulmonary/Chest: Effort normal. She has no wheezes. She has no rales.  Abdominal: She exhibits no distension.  Neurological: She is alert and oriented to person, place, and time.  Skin: Skin is warm and dry.  Psychiatric: She has a normal mood and affect.  Nursing  note and vitals reviewed.  ED Treatments / Results  DIAGNOSTIC STUDIES:  Oxygen Saturation is 100% on RA, normal by my interpretation.    COORDINATION OF CARE:  9:53 PM Discussed treatment plan with pt at bedside and pt agreed to plan.  Labs (all labs ordered are listed, but only abnormal results are displayed) Labs Reviewed - No data to display  EKG  EKG Interpretation None       Radiology Dg Chest 2 View  Result Date: 03/12/2016 CLINICAL DATA:  Cough and congestion for 2 days with dyspnea. No chest pain. EXAM: CHEST  2 VIEW COMPARISON:  06/21/2015 FINDINGS: Cardiomediastinal contours are stable in appearance. No pneumonic consolidation, pneumothorax nor effusion. No pulmonary edema. Osteoarthritis about both acromioclavicular joints. No acute osseous abnormality. IMPRESSION: No active cardiopulmonary disease. Electronically Signed   By: Ashley Royalty M.D.   On: 03/12/2016 22:37    Procedures Procedures (including critical care time)  Medications Ordered in ED Medications  ipratropium-albuterol (DUONEB) 0.5-2.5 (3) MG/3ML nebulizer solution 3 mL (3 mLs Nebulization Given 03/12/16 2200)  predniSONE (DELTASONE) tablet 40 mg (40 mg Oral Given 03/12/16  2157)  azithromycin (ZITHROMAX) tablet 500 mg (500 mg Oral Given 03/12/16 2157)     Initial Impression / Assessment and Plan / ED Course  I have reviewed the triage vital signs and the nursing notes.  Pertinent labs & imaging results that were available during my care of the patient were reviewed by me and considered in my medical decision making (see chart for details).  Clinical Course    Bronchitis. Will dc on antibiotics and prednisone, has cough medicine at home. No e/o pneumonia or respiratory distress to need admission.   Final Clinical Impressions(s) / ED Diagnoses   Final diagnoses:  Cough  Upper respiratory tract infection, unspecified type  Bronchitis    New Prescriptions Discharge Medication List as of 03/12/2016 11:05 PM    START taking these medications   Details  azithromycin (ZITHROMAX) 250 MG tablet Take 1 tablet (250 mg total) by mouth daily. Take 1 every day until finished., Starting Wed 03/13/2016, Print    predniSONE (DELTASONE) 20 MG tablet 2 tabs po daily x 4 days, Print      I personally performed the services described in this documentation, which was scribed in my presence. The recorded information has been reviewed and is accurate.    Merrily Pew, MD 03/12/16 [phone]

## 2016-03-12 NOTE — ED Notes (Signed)
Patient transported to X-ray 

## 2016-03-12 NOTE — ED Notes (Signed)
Pt verbalizes understanding of d/c instructions and denies any further needs at this time. 

## 2016-03-12 NOTE — ED Notes (Signed)
Pt returned from xray

## 2016-03-26 NOTE — Telephone Encounter (Signed)
Patient's Botox was approved . Per [facility] Patient's has a high co pay that she cant afford at this time . Carley Hammed is working with her on a patient assistance program for her co pay. This could take up to a few weeks  will call patient with all details.  Carley Hammed is also contacting patient as well .   I tried to call patient x 2  Voice mail box has not been set up yet. Botox apt will be Cx. Thanks Hinton Dyer.

## 2016-04-03 ENCOUNTER — Ambulatory Visit: Payer: Medicare Other | Admitting: Neurology

## 2016-04-04 DIAGNOSIS — M545 Low back pain: Secondary | ICD-10-CM | POA: Diagnosis not present

## 2016-04-04 DIAGNOSIS — E1140 Type 2 diabetes mellitus with diabetic neuropathy, unspecified: Secondary | ICD-10-CM | POA: Diagnosis not present

## 2016-04-04 DIAGNOSIS — G43909 Migraine, unspecified, not intractable, without status migrainosus: Secondary | ICD-10-CM | POA: Diagnosis not present

## 2016-04-04 DIAGNOSIS — E119 Type 2 diabetes mellitus without complications: Secondary | ICD-10-CM | POA: Diagnosis not present

## 2016-04-04 DIAGNOSIS — I10 Essential (primary) hypertension: Secondary | ICD-10-CM | POA: Diagnosis not present

## 2016-04-05 DIAGNOSIS — I10 Essential (primary) hypertension: Secondary | ICD-10-CM | POA: Diagnosis not present

## 2016-04-05 DIAGNOSIS — E1140 Type 2 diabetes mellitus with diabetic neuropathy, unspecified: Secondary | ICD-10-CM | POA: Diagnosis not present

## 2016-04-15 NOTE — Telephone Encounter (Signed)
Spoke with [facility] who stated that the patient called and stated that she wanted to proceed with injections. We scheduled delivery for 04/17/16.

## 2016-04-17 ENCOUNTER — Encounter: Payer: Self-pay | Admitting: Neurology

## 2016-04-17 ENCOUNTER — Ambulatory Visit (INDEPENDENT_AMBULATORY_CARE_PROVIDER_SITE_OTHER): Payer: Medicare Other | Admitting: Neurology

## 2016-04-17 ENCOUNTER — Telehealth: Payer: Self-pay | Admitting: Neurology

## 2016-04-17 ENCOUNTER — Encounter: Payer: Self-pay | Admitting: *Deleted

## 2016-04-17 VITALS — BP 119/68 | HR 90 | Ht 63.0 in | Wt 211.0 lb

## 2016-04-17 DIAGNOSIS — G43709 Chronic migraine without aura, not intractable, without status migrainosus: Secondary | ICD-10-CM | POA: Diagnosis not present

## 2016-04-17 NOTE — Progress Notes (Signed)
**  Botox 100 units x 2 vials, NDC [id number], Lot [id number], Exp 10/2018, specialty pharmacy.//mck,rn**

## 2016-04-17 NOTE — Progress Notes (Signed)
[facility]  PATIENT: [name] [name] DOB: 1945/10/19  HISTORY OF PRESENT ILLNESS: HISTORY: [name] [name] is a 70 year old right-handed African American female, accompanied by her daughter [name] [name] referred by her primary care physician [name] for evaluation of seizure, abnormal MRI of brain, she was recently evaluated by [facility] Neurologist. [name]. She had a history of seizure since age 5, generalized tonic-clonic seizure, no warning signs, initially she was treated with Dilantin and phenobarbital, recent 10 years, she had been treated with titrating dose of Topamax 100 +200 mg twice a day, Keppra 750 mg twice a day, she denies significant side effects. Most recent seizure was in April 2015, she also had recent MRI of the brain, was told that she had small vessel disease, but per patient, there was no change in treatment plan, She also complains of few years history of rapid onset gait difficulty, occasional urinary incontinence, she has past medical history of obesity, insulin-dependent diabetes, hypertension. She has never driven, live at home with her husband,  We have reviewed MRI of the brain from March 2015, from [facility], moderate periventricular white matter disease, no acute lesions. EEG was normal Topamax level was 15.6, Keppra level was 8:6, while taking Topamax 100 plus 200 mg twice a day, Keppra 750 mg twice a day, she is tolerating the medication well, there is no significant side effect.  Update January 30 2016: Her seizure is under good control with current dose of Keppra 750 mg twice a day, Topamax 200/200 mg last a seizure was in April 2015,  She also complains of chronic neck pain, low back pain, unsteady gait, I have personally reviewed MRI of lumbar August 2015 showed multilevel degenerative disc disease, L4-5 severe facet arthropathy, with fluid in the facet joint, with mild-to-moderate foraminal stenosis there was no  evidence of canal stenosis. MRI of cervical spine, multilevel degenerative disc disease, variable degree of foraminal stenosis at C 4 5, C5-6, no evidence of cord signal abnormality. No cord compression.   She reported long history of migraine headaches, gradually getting worse, now has migraines about 3-4 times each week, is associated nauseous, lasting all day long, with light noise sensitivity. She has tried Fioricet, tramadol, with limited help,  UPDATE Nov 8th 2017: This is her first Botox injection as migraine prevention, potential side effect explained,  REVIEW OF SYSTEMS: Full 14 system review of systems performed and notable only for those listed, all others are neg:  Bruise easily   ALLERGIES: No Known Allergies  HOME MEDICATIONS: Outpatient Medications Prior to Visit  Medication Sig Dispense Refill  . albuterol (PROVENTIL HFA;VENTOLIN HFA) 108 (90 BASE) MCG/ACT inhaler Inhale 2 puffs into the lungs every 6 (six) hours as needed for wheezing.    Marland Kitchen aspirin 81 MG chewable tablet Chew 81 mg by mouth daily.    Marland Kitchen azithromycin (ZITHROMAX) 250 MG tablet Take 1 tablet (250 mg total) by mouth daily. Take 1 every day until finished. 4 tablet 0  . butalbital-acetaminophen-caffeine (FIORICET) 50-325-40 MG tablet Take 1 tablet by mouth every 8 (eight) hours as needed for headache or migraine. 20 tablet 0  . cholecalciferol (VITAMIN D) 1000 UNITS tablet Take 1 tablet (1,000 Units total) by mouth daily.    . dorzolamide (TRUSOPT) 2 % ophthalmic solution Place 1 drop into both eyes 3 (three) times daily.    Marland Kitchen gabapentin (NEURONTIN) 300 MG capsule Take 300 mg by mouth 3 (three) times daily.    . hydrochlorothiazide (HYDRODIURIL) 25 MG  tablet Take 25 mg by mouth daily.      . Insulin Detemir (LEVEMIR FLEXTOUCH) 100 UNIT/ML Pen Inject into the skin.    Marland Kitchen levETIRAcetam (KEPPRA) 750 MG tablet Take 1 tablet (750 mg total) by mouth 2 (two) times daily. 180 tablet 3  . Liraglutide (VICTOZA) 18 MG/3ML  SOPN Inject 1.8 mg into the skin.    Marland Kitchen losartan (COZAAR) 50 MG tablet Take 50 mg by mouth daily.    . ondansetron (ZOFRAN ODT) 4 MG disintegrating tablet Take 1 tablet (4 mg total) by mouth every 8 (eight) hours as needed. 20 tablet 6  . predniSONE (DELTASONE) 20 MG tablet 2 tabs po daily x 4 days 8 tablet 0  . propranolol (INDERAL) 40 MG tablet Take 1 tablet (40 mg total) by mouth 2 (two) times daily. 60 tablet 11  . ranitidine (ZANTAC) 150 MG tablet Take 150 mg by mouth 2 (two) times daily.      . SUMAtriptan (IMITREX) 25 MG tablet Take 1 tab at onset of migraine.  May repeat in 2 hrs, prn.  Max: 2 tab/24 hrs - 10 tabs/30 days. 30 tablet 3  . topiramate (TOPAMAX) 200 MG tablet Take 1 tablet by mouth two  times daily 180 tablet 2  . traMADol (ULTRAM) 50 MG tablet Take 1 tablet (50 mg total) by mouth every 6 (six) hours as needed. 45 tablet 5   No facility-administered medications prior to visit.     PAST MEDICAL HISTORY: Past Medical History:  Diagnosis Date  . Arthritis    "all over my body" (12/28/2012)  . Chronic bronchitis ([place])    "get it q yr" (12/28/2012)  . Chronic lower back pain   . Enlarged heart   . GERD (gastroesophageal reflux disease)   . [place] mal seizure [facility]) ~ 1962   "last sz was 08/2012; long time before that" (12/28/2012)  . [id number])    "maybe 2/month" (12/28/2012)  . Hypertension   . OSA on CPAP   . Type II diabetes mellitus ([place])     PAST SURGICAL HISTORY: Past Surgical History:  Procedure Laterality Date  . ABDOMINAL HYSTERECTOMY  1970's  . CATARACT EXTRACTION W/ INTRAOCULAR LENS  IMPLANT, BILATERAL Bilateral ~ 2012  . DILATION AND CURETTAGE OF UTERUS  1970's  . LEFT HEART CATHETERIZATION WITH CORONARY ANGIOGRAM N/A 03/02/2013   Procedure: LEFT HEART CATHETERIZATION WITH CORONARY ANGIOGRAM;  Surgeon: Laverda Page, MD;  Location: [facility] CATH LAB;  Service: Cardiovascular;  Laterality: N/A;    FAMILY HISTORY: Family History  Problem Relation Age of  Onset  . Diabetes type II Sister   . Coronary artery disease Sister   . Seizures Sister   . Seizures Daughter     SOCIAL HISTORY: Social History   Social History  . Marital status: Married    Spouse name: N/A  . Number of children: N/A  . Years of education: N/A   Occupational History  . Not on file.   Social History Main Topics  . Smoking status: Never Smoker  . Smokeless tobacco: Never Used  . Alcohol use No  . Drug use: No  . Sexual activity: Not on file   Other Topics Concern  . Not on file   Social History Narrative  . No narrative on file     PHYSICAL EXAM  There were no vitals filed for this visit. There is no height or weight on file to calculate BMI. Generalized: Well developed, obese female in no acute distress  Head: normocephalic and atraumatic,. Oropharynx benign  Neck: Supple, no carotid bruits  Cardiac: Regular rate rhythm, no murmur  Musculoskeletal: No deformity   Neurological examination   Mentation: Alert oriented to time, place, history taking. Follows all commands speech and language fluent Cranial nerve II-XII: Pupils were equal round reactive to light extraocular movements were full, visual field were full on confrontational test. Facial sensation and strength were normal. hearing was intact to finger rubbing bilaterally. Uvula tongue midline. head turning and shoulder shrug were normal and symmetric.Tongue protrusion into cheek strength was normal. Motor: normal bulk and tone, full strength in the BUE, BLE, fine finger movements normal, no pronator drift. No focal weakness Sensory: normal and symmetric to light touch, in the upper and lower extremities Coordination: finger-nose-finger, heel-to-shin bilaterally, no dysmetria Gait: wide based, cautious, steady gait, bilateral knee valgus, no assistive device Reflexes: Brachioradialis 2/2, biceps 2/2, triceps 2/2, patellar 3/3, Achilles 2/2, plantar responses were flexor  bilaterally.  DIAGNOSTIC DATA (LABS, IMAGING, TESTING) - I reviewed patient records, labs, notes, testing and imaging myself where available.  Lab Results  Component Value Date   WBC 9.7 01/09/2016   HGB 12.4 01/09/2016   HCT 37.9 01/09/2016   MCV 89.2 01/09/2016   PLT 258 01/09/2016      Component Value Date/Time   NA 139 01/09/2016 1750   K 3.1 (L) 01/09/2016 1750   CL 102 01/09/2016 1750   CO2 30 01/09/2016 1750   GLUCOSE 128 (H) 01/09/2016 1750   BUN 16 01/09/2016 1750   CREATININE 0.89 01/09/2016 1750   CALCIUM 8.8 (L) 01/09/2016 1750   PROT 7.6 02/17/2014 0004   ALBUMIN 3.5 02/17/2014 0004   AST 18 02/17/2014 0004   ALT 13 02/17/2014 0004   ALKPHOS 94 02/17/2014 0004   BILITOT <0.2 (L) 02/17/2014 0004   GFRNONAA >60 01/09/2016 1750   GFRAA >60 01/09/2016 1750       ASSESSMENT AND PLAN  70 y.o. year old female   Chronic migraine headaches, progressive worsening  She has tried and failed preventive medications Topamax,  Abortive treatment tramadol, Fioricet provides limited help  Add on preventive medication  BOTOX as migraine prevention  BOTOX injection was performed according to protocol by [employer]. 100 units of BOTOX was dissolved into 2 cc NS.    Corrugator 2 sites, 10 units Procerus 1 site, 5 unit Frontalis 4 sites,  20 units, Temporalis 8 sites,  40 units  Occipitalis 6 sites, 30 units Cervical Paraspinal, 4 sites, 20 units Trapezius, 6 sites, 30 units  Extra 45 units was injected at her upper cervical paraspinal muscles.  Patient tolerate the injection well. Will return for repeat injection in 3 months.  Seizure  Keep current dose of Topamax 200 mg twice a day  Keppra 750 mg twice a day  Last seizure was in April 2015, if she continued to do well, may consider tapering off Keppra   Marcial Pacas, M.D. Ph.D.  [facility] [place], [place] [address] Phone: [phone] Fax:      [phone]

## 2016-04-17 NOTE — Telephone Encounter (Signed)
Pt had botox inj today 04/17/16. Pt needs 12 week botox scheduled please. Verified phone number. cb

## 2016-04-23 ENCOUNTER — Telehealth: Payer: Self-pay | Admitting: Neurology

## 2016-04-23 NOTE — Telephone Encounter (Signed)
Patient's Botox injection scheduled for 07-24-16 at 2:30.

## 2016-04-23 NOTE — Telephone Encounter (Signed)
Patient is calling and has questions about the Botox injection she had on 04-17-16. Since then her chest has been hurting and pain in both of arms and legs. Please call and discuss.

## 2016-04-23 NOTE — Telephone Encounter (Signed)
Dr. Krista Blue reviewed her chart - states this is unrelated to Botox.  Spoke to patient - says she has been feeling bad and has been taking cold medications.  Instructed her to contact her PCP for further evaluation.  She was agreeable to this plan.

## 2016-04-23 NOTE — Telephone Encounter (Signed)
Would you mind calling to schedule this patient?

## 2016-04-30 ENCOUNTER — Other Ambulatory Visit: Payer: Self-pay | Admitting: Neurology

## 2016-04-30 ENCOUNTER — Emergency Department (HOSPITAL_BASED_OUTPATIENT_CLINIC_OR_DEPARTMENT_OTHER)
Admission: EM | Admit: 2016-04-30 | Discharge: 2016-04-30 | Disposition: A | Discharge status: Home / Self Care | Payer: Medicare Other | Attending: Emergency Medicine | Admitting: Emergency Medicine

## 2016-04-30 ENCOUNTER — Encounter (HOSPITAL_BASED_OUTPATIENT_CLINIC_OR_DEPARTMENT_OTHER): Payer: Self-pay | Admitting: *Deleted

## 2016-04-30 DIAGNOSIS — Z7982 Long term (current) use of aspirin: Secondary | ICD-10-CM | POA: Diagnosis not present

## 2016-04-30 DIAGNOSIS — H9201 Otalgia, right ear: Secondary | ICD-10-CM | POA: Diagnosis not present

## 2016-04-30 DIAGNOSIS — E119 Type 2 diabetes mellitus without complications: Secondary | ICD-10-CM | POA: Diagnosis not present

## 2016-04-30 DIAGNOSIS — Z794 Long term (current) use of insulin: Secondary | ICD-10-CM | POA: Insufficient documentation

## 2016-04-30 DIAGNOSIS — J029 Acute pharyngitis, unspecified: Secondary | ICD-10-CM | POA: Insufficient documentation

## 2016-04-30 DIAGNOSIS — I10 Essential (primary) hypertension: Secondary | ICD-10-CM | POA: Insufficient documentation

## 2016-04-30 LAB — RAPID STREP SCREEN (MED CTR MEBANE ONLY): STREPTOCOCCUS, GROUP A SCREEN (DIRECT): NEGATIVE

## 2016-04-30 MED ORDER — IBUPROFEN 800 MG PO TABS
800.0000 mg | ORAL_TABLET | Freq: Once | ORAL | Status: AC
  Administered 2016-04-30: 800 mg via ORAL
  Filled 2016-04-30: qty 1

## 2016-04-30 NOTE — Discharge Instructions (Signed)
Take motrin and tylenol for your pain. See  your doctor. Get help right away if: Your neck becomes stiff. You drool or are unable to swallow liquids. You vomit or are unable to keep medicines or liquids down. You have severe pain that does not go away with the use of recommended medicines. You have trouble breathing (not caused by a stuffy nose).

## 2016-04-30 NOTE — ED Provider Notes (Signed)
[facility]PT [facility] Provider Note   CSN: [id number] Arrival date & time: 04/30/16  [id number]     History   Chief Complaint Chief Complaint  Patient presents with  . Otalgia    HPI [name] [name] is a 70 y.o. female  With sore throat and R ear pain x 2 days. Throbbing, 8/10, worse with swallowing. + dry cough (mild). Denies congestion, rhinorrhea, headaches. Denies fevers. Able to tolerate POs  HPI  Past Medical History:  Diagnosis Date  . Arthritis    "all over my body" (12/28/2012)  . Chronic bronchitis ([place])    "get it q yr" (12/28/2012)  . Chronic lower back pain   . Enlarged heart   . GERD (gastroesophageal reflux disease)   . [place] mal seizure [facility]) ~ 1962   "last sz was 08/2012; long time before that" (12/28/2012)  . [id number])    "maybe 2/month" (12/28/2012)  . Hypertension   . OSA on CPAP   . Type II diabetes mellitus [facility])     Patient Active Problem List   Diagnosis Date Noted  . Seizure disorder, grand mal ([place]) 09/20/2014  . Chronic migraine without aura, not intractable, without status migrainosus 03/14/2014  . Abnormality of gait 11/29/2013  . HTN (hypertension) 12/28/2012  . OSA on CPAP 12/28/2012  . Back pain 06/19/2011  . Diabetes ([place]) 06/19/2011  . Seizure disorder ([place]) 06/19/2011    Past Surgical History:  Procedure Laterality Date  . ABDOMINAL HYSTERECTOMY  1970's  . CATARACT EXTRACTION W/ INTRAOCULAR LENS  IMPLANT, BILATERAL Bilateral ~ 2012  . DILATION AND CURETTAGE OF UTERUS  1970's  . LEFT HEART CATHETERIZATION WITH CORONARY ANGIOGRAM N/A 03/02/2013   Procedure: LEFT HEART CATHETERIZATION WITH CORONARY ANGIOGRAM;  Surgeon: Laverda Page, MD;  Location: [facility] CATH LAB;  Service: Cardiovascular;  Laterality: N/A;    OB History    No data available       Home Medications    Prior to Admission medications   Medication Sig Start Date End Date Taking? Authorizing Provider  albuterol (PROVENTIL HFA;VENTOLIN HFA) 108 (90 BASE)  MCG/ACT inhaler Inhale 2 puffs into the lungs every 6 (six) hours as needed for wheezing.    Historical Provider, MD  aspirin 81 MG chewable tablet Chew 81 mg by mouth daily.    Historical Provider, MD  azithromycin (ZITHROMAX) 250 MG tablet Take 1 tablet (250 mg total) by mouth daily. Take 1 every day until finished. 03/13/16   Merrily Pew, MD  butalbital-acetaminophen-caffeine (FIORICET) 50-325-40 MG tablet Take 1 tablet by mouth every 8 (eight) hours as needed for headache or migraine. 01/09/16 01/08/17  Domenic Moras, PA-C  cholecalciferol (VITAMIN D) 1000 UNITS tablet Take 1 tablet (1,000 Units total) by mouth daily. 12/31/12   Sheila Oats, MD  dorzolamide (TRUSOPT) 2 % ophthalmic solution Place 1 drop into both eyes 3 (three) times daily.    Historical Provider, MD  gabapentin (NEURONTIN) 300 MG capsule Take 300 mg by mouth 3 (three) times daily.    Historical Provider, MD  hydrochlorothiazide (HYDRODIURIL) 25 MG tablet Take 25 mg by mouth daily.      Historical Provider, MD  Insulin Detemir (LEVEMIR FLEXTOUCH) 100 UNIT/ML Pen Inject into the skin. 03/14/15 03/13/16  Historical Provider, MD  levETIRAcetam (KEPPRA) 750 MG tablet Take 1 tablet (750 mg total) by mouth 2 (two) times daily. 09/13/15   Dennie Bible, NP  Liraglutide (VICTOZA) 18 MG/3ML SOPN Inject 1.8 mg into the skin. 04/03/15   Historical Provider, MD  losartan (COZAAR) 50 MG tablet Take 50 mg by mouth daily.    Historical Provider, MD  ondansetron (ZOFRAN ODT) 4 MG disintegrating tablet Take 1 tablet (4 mg total) by mouth every 8 (eight) hours as needed. 01/30/16   Marcial Pacas, MD  predniSONE (DELTASONE) 20 MG tablet 2 tabs po daily x 4 days 03/12/16   Merrily Pew, MD  propranolol (INDERAL) 40 MG tablet Take 1 tablet (40 mg total) by mouth 2 (two) times daily. 01/30/16   Marcial Pacas, MD  ranitidine (ZANTAC) 150 MG tablet Take 150 mg by mouth 2 (two) times daily.      Historical Provider, MD  SUMAtriptan (IMITREX) 25 MG tablet Take 1 tab  at onset of migraine.  May repeat in 2 hrs, prn.  Max: 2 tab/24 hrs - 10 tabs/30 days. 02/29/16   Marcial Pacas, MD  topiramate (TOPAMAX) 200 MG tablet Take 1 tablet by mouth two  times daily 09/13/15   Dennie Bible, NP  traMADol (ULTRAM) 50 MG tablet Take 1 tablet (50 mg total) by mouth every 6 (six) hours as needed. 09/13/15   Dennie Bible, NP    Family History Family History  Problem Relation Age of Onset  . Diabetes type II Sister   . Coronary artery disease Sister   . Seizures Sister   . Seizures Daughter     Social History Social History  Substance Use Topics  . Smoking status: Never Smoker  . Smokeless tobacco: Never Used  . Alcohol use No     Allergies   Patient has no known allergies.   Review of Systems Review of Systems  Ten systems reviewed and are negative for acute change, except as noted in the HPI.   Physical Exam Updated Vital Signs BP 159/96 (BP Location: Left Arm)   Pulse 94   Temp 98.2 F (36.8 C) (Oral)   Resp 18   Ht 5' (1.524 m)   Wt 95.7 kg   SpO2 98%   BMI 41.21 kg/m   Physical Exam  Constitutional: She is oriented to person, place, and time. She appears well-developed and well-nourished. No distress.  HENT:  Head: Normocephalic and atraumatic.  Right Ear: Tympanic membrane and external ear normal. No mastoid tenderness.  Left Ear: Tympanic membrane and external ear normal. No mastoid tenderness.  Mouth/Throat: Uvula is midline. No trismus in the jaw. No uvula swelling. Posterior oropharyngeal erythema present. No oropharyngeal exudate or posterior oropharyngeal edema.  Hoarse voice  Eyes: Conjunctivae are normal. No scleral icterus.  Neck: Normal range of motion.  Cardiovascular: Normal rate, regular rhythm and normal heart sounds.  Exam reveals no gallop and no friction rub.   No murmur heard. Pulmonary/Chest: Effort normal and breath sounds normal. No respiratory distress.  Abdominal: Soft. Bowel sounds are normal. She  exhibits no distension and no mass. There is no tenderness. There is no guarding.  Neurological: She is alert and oriented to person, place, and time.  Skin: Skin is warm and dry. She is not diaphoretic.     ED Treatments / Results  Labs (all labs ordered are listed, but only abnormal results are displayed) Labs Reviewed - No data to display  EKG  EKG Interpretation None       Radiology No results found.  Procedures Procedures (including critical care time)  Medications Ordered in ED Medications - No data to display   Initial Impression / Assessment and Plan / ED Course  I have reviewed the triage vital signs  and the nursing notes.  Pertinent labs & imaging results that were available during my care of the patient were reviewed by me and considered in my medical decision making (see chart for details).  Clinical Course    Pt afebrile without tonsillar exudate, negative strep. Presents with mild cervical lymphadenopathy, & dysphagia; diagnosis of viral pharyngitis. No abx indicated. DC w symptomatic tx for pain  Pt does not appear dehydrated, but did discuss importance of water rehydration. Presentation non concerning for PTA or infxn spread to soft tissue. No trismus or uvula deviation. Specific return precautions discussed. Pt able to drink water in ED without difficulty with intact air way. Recommended PCP follow up.   Final Clinical Impressions(s) / ED Diagnoses   Final diagnoses:  Viral pharyngitis  Ear pain, right    New Prescriptions New Prescriptions   No medications on file     Margarita Mail, PA-C 04/30/16 [place], MD 05/03/16 [phone]

## 2016-04-30 NOTE — ED Triage Notes (Signed)
Pt reports right ear pain x Monday with cough. Denies any fevers.

## 2016-05-02 LAB — CULTURE, GROUP A STREP (THRC)

## 2016-05-03 DIAGNOSIS — E1140 Type 2 diabetes mellitus with diabetic neuropathy, unspecified: Secondary | ICD-10-CM | POA: Diagnosis not present

## 2016-05-03 DIAGNOSIS — I10 Essential (primary) hypertension: Secondary | ICD-10-CM | POA: Diagnosis not present

## 2016-05-09 DIAGNOSIS — E119 Type 2 diabetes mellitus without complications: Secondary | ICD-10-CM | POA: Diagnosis not present

## 2016-05-09 DIAGNOSIS — E1140 Type 2 diabetes mellitus with diabetic neuropathy, unspecified: Secondary | ICD-10-CM | POA: Diagnosis not present

## 2016-05-09 DIAGNOSIS — J069 Acute upper respiratory infection, unspecified: Secondary | ICD-10-CM | POA: Diagnosis not present

## 2016-05-09 DIAGNOSIS — G43909 Migraine, unspecified, not intractable, without status migrainosus: Secondary | ICD-10-CM | POA: Diagnosis not present

## 2016-05-09 DIAGNOSIS — I10 Essential (primary) hypertension: Secondary | ICD-10-CM | POA: Diagnosis not present

## 2016-05-15 ENCOUNTER — Other Ambulatory Visit: Payer: Self-pay | Admitting: Neurology

## 2016-05-15 MED ORDER — TRAMADOL HCL 50 MG PO TABS: 50.0000 mg | ORAL_TABLET | Freq: Four times a day (QID) | ORAL | 5 refills | Status: AC | PRN

## 2016-05-15 NOTE — Progress Notes (Signed)
Refilled her tramadol. 

## 2016-05-28 DIAGNOSIS — E119 Type 2 diabetes mellitus without complications: Secondary | ICD-10-CM | POA: Diagnosis not present

## 2016-05-28 DIAGNOSIS — E559 Vitamin D deficiency, unspecified: Secondary | ICD-10-CM | POA: Diagnosis not present

## 2016-05-28 DIAGNOSIS — Z794 Long term (current) use of insulin: Secondary | ICD-10-CM | POA: Diagnosis not present

## 2016-05-28 DIAGNOSIS — I10 Essential (primary) hypertension: Secondary | ICD-10-CM | POA: Diagnosis not present

## 2016-05-28 DIAGNOSIS — E113593 Type 2 diabetes mellitus with proliferative diabetic retinopathy without macular edema, bilateral: Secondary | ICD-10-CM | POA: Diagnosis not present

## 2016-06-06 DIAGNOSIS — I10 Essential (primary) hypertension: Secondary | ICD-10-CM | POA: Diagnosis not present

## 2016-06-06 DIAGNOSIS — E1140 Type 2 diabetes mellitus with diabetic neuropathy, unspecified: Secondary | ICD-10-CM | POA: Diagnosis not present

## 2016-06-18 DIAGNOSIS — E119 Type 2 diabetes mellitus without complications: Secondary | ICD-10-CM | POA: Diagnosis not present

## 2016-06-18 DIAGNOSIS — I10 Essential (primary) hypertension: Secondary | ICD-10-CM | POA: Diagnosis not present

## 2016-06-18 DIAGNOSIS — G43909 Migraine, unspecified, not intractable, without status migrainosus: Secondary | ICD-10-CM | POA: Diagnosis not present

## 2016-06-22 ENCOUNTER — Other Ambulatory Visit: Payer: Self-pay | Admitting: Nurse Practitioner

## 2016-07-03 ENCOUNTER — Encounter (HOSPITAL_BASED_OUTPATIENT_CLINIC_OR_DEPARTMENT_OTHER): Payer: Self-pay | Admitting: Emergency Medicine

## 2016-07-03 ENCOUNTER — Emergency Department (HOSPITAL_BASED_OUTPATIENT_CLINIC_OR_DEPARTMENT_OTHER)
Admission: EM | Admit: 2016-07-03 | Discharge: 2016-07-03 | Disposition: A | Discharge status: Home / Self Care | Payer: Medicare Other | Attending: Emergency Medicine | Admitting: Emergency Medicine

## 2016-07-03 DIAGNOSIS — E119 Type 2 diabetes mellitus without complications: Secondary | ICD-10-CM | POA: Insufficient documentation

## 2016-07-03 DIAGNOSIS — Z794 Long term (current) use of insulin: Secondary | ICD-10-CM | POA: Diagnosis not present

## 2016-07-03 DIAGNOSIS — H578 Other specified disorders of eye and adnexa: Secondary | ICD-10-CM | POA: Diagnosis not present

## 2016-07-03 DIAGNOSIS — I10 Essential (primary) hypertension: Secondary | ICD-10-CM | POA: Diagnosis not present

## 2016-07-03 DIAGNOSIS — Z7982 Long term (current) use of aspirin: Secondary | ICD-10-CM | POA: Diagnosis not present

## 2016-07-03 DIAGNOSIS — H5789 Other specified disorders of eye and adnexa: Secondary | ICD-10-CM

## 2016-07-03 DIAGNOSIS — H579 Unspecified disorder of eye and adnexa: Secondary | ICD-10-CM | POA: Diagnosis present

## 2016-07-03 MED ORDER — TETRACAINE HCL 0.5 % OP SOLN
1.0000 [drp] | Freq: Once | OPHTHALMIC | Status: AC
Start: 2016-07-03 — End: 2016-07-03
  Administered 2016-07-03: 1 [drp] via OPHTHALMIC
  Filled 2016-07-03: qty 4

## 2016-07-03 NOTE — ED Notes (Signed)
Pt states the pain in her eyes and blood shot eyes started at the last part of December. Pt's previous eye surgeries were a couple years previous. Pt's last Botox injection was around the middle of November.

## 2016-07-03 NOTE — ED Provider Notes (Signed)
[facility] DEPT [facility] Provider Note   CSN: [id number] Arrival date & time: 07/03/16  1842  By signing my name below, I, [name] [name], attest that this documentation has been prepared under the direction and in the presence of Alyse Low, [place]. Electronically Signed: Dora [name], Scribe. 07/03/2016. 10:23 PM.  History   Chief Complaint Chief Complaint  Patient presents with  . Eye Problem    The history is provided by the patient. No language interpreter was used.     HPI Comments: [name] [name] is a 71 y.o. female who presents to the Emergency Department complaining of intermittent left eye redness for about one month. She notes associated left eye pain and some occasional spotty vision. Pt is currently receiving Botox shots in her head for chronic headaches and notes her eye redness has been present intermittently since receiving a shot in December 2017. Pt has a h/o retinal hemorrhage and is concerned for the same. She states she has a h/o glaucoma and cataract surgery as well. Pt currently uses prescription eyedrops regularly for glaucoma. She denies any other complaints at this time.  Past Medical History:  Diagnosis Date  . Arthritis    "all over my body" (12/28/2012)  . Chronic bronchitis ([place])    "get it q yr" (12/28/2012)  . Chronic lower back pain   . Enlarged heart   . GERD (gastroesophageal reflux disease)   . [place] mal seizure [facility]) ~ 1962   "last sz was 08/2012; long time before that" (12/28/2012)  . [id number])    "maybe 2/month" (12/28/2012)  . Hypertension   . OSA on CPAP   . Type II diabetes mellitus [facility])     Patient Active Problem List   Diagnosis Date Noted  . Seizure disorder, grand mal ([place]) 09/20/2014  . Chronic migraine without aura, not intractable, without status migrainosus 03/14/2014  . Abnormality of gait 11/29/2013  . HTN (hypertension) 12/28/2012  . OSA on CPAP 12/28/2012  . Back pain 06/19/2011  . Diabetes ([place]) 06/19/2011  .  Seizure disorder ([place]) 06/19/2011    Past Surgical History:  Procedure Laterality Date  . ABDOMINAL HYSTERECTOMY  1970's  . CATARACT EXTRACTION W/ INTRAOCULAR LENS  IMPLANT, BILATERAL Bilateral ~ 2012  . DILATION AND CURETTAGE OF UTERUS  1970's  . LEFT HEART CATHETERIZATION WITH CORONARY ANGIOGRAM N/A 03/02/2013   Procedure: LEFT HEART CATHETERIZATION WITH CORONARY ANGIOGRAM;  Surgeon: Laverda Page, MD;  Location: [facility] CATH LAB;  Service: Cardiovascular;  Laterality: N/A;    OB History    No data available       Home Medications    Prior to Admission medications   Medication Sig Start Date End Date Taking? Authorizing Provider  albuterol (PROVENTIL HFA;VENTOLIN HFA) 108 (90 BASE) MCG/ACT inhaler Inhale 2 puffs into the lungs every 6 (six) hours as needed for wheezing.    Historical Provider, MD  aspirin 81 MG chewable tablet Chew 81 mg by mouth daily.    Historical Provider, MD  azithromycin (ZITHROMAX) 250 MG tablet Take 1 tablet (250 mg total) by mouth daily. Take 1 every day until finished. 03/13/16   Merrily Pew, MD  butalbital-acetaminophen-caffeine (FIORICET) 50-325-40 MG tablet Take 1 tablet by mouth every 8 (eight) hours as needed for headache or migraine. 01/09/16 01/08/17  Domenic Moras, PA-C  cholecalciferol (VITAMIN D) 1000 UNITS tablet Take 1 tablet (1,000 Units total) by mouth daily. 12/31/12   Sheila Oats, MD  dorzolamide (TRUSOPT) 2 % ophthalmic solution Place 1 drop into both  eyes 3 (three) times daily.    Historical Provider, MD  gabapentin (NEURONTIN) 300 MG capsule Take 300 mg by mouth 3 (three) times daily.    Historical Provider, MD  hydrochlorothiazide (HYDRODIURIL) 25 MG tablet Take 25 mg by mouth daily.      Historical Provider, MD  Insulin Detemir (LEVEMIR FLEXTOUCH) 100 UNIT/ML Pen Inject into the skin. 03/14/15 03/13/16  Historical Provider, MD  levETIRAcetam (KEPPRA) 750 MG tablet Take 1 tablet (750 mg total) by mouth 2 (two) times daily. 09/13/15   Dennie Bible, NP  Liraglutide (VICTOZA) 18 MG/3ML SOPN Inject 1.8 mg into the skin. 04/03/15   Historical Provider, MD  losartan (COZAAR) 50 MG tablet Take 50 mg by mouth daily.    Historical Provider, MD  ondansetron (ZOFRAN ODT) 4 MG disintegrating tablet Take 1 tablet (4 mg total) by mouth every 8 (eight) hours as needed. 01/30/16   Marcial Pacas, MD  predniSONE (DELTASONE) 20 MG tablet 2 tabs po daily x 4 days 03/12/16   Merrily Pew, MD  propranolol (INDERAL) 40 MG tablet Take 1 tablet (40 mg total) by mouth 2 (two) times daily. 01/30/16   Marcial Pacas, MD  ranitidine (ZANTAC) 150 MG tablet Take 150 mg by mouth 2 (two) times daily.      Historical Provider, MD  SUMAtriptan (IMITREX) 25 MG tablet Take 1 tab at onset of migraine.  May repeat in 2 hrs, prn.  Max: 2 tab/24 hrs - 10 tabs/30 days. 02/29/16   Marcial Pacas, MD  topiramate (TOPAMAX) 200 MG tablet TAKE 1 TABLET BY MOUTH TWO  TIMES DAILY 06/25/16   Dennie Bible, NP  traMADol (ULTRAM) 50 MG tablet Take 1 tablet (50 mg total) by mouth every 6 (six) hours as needed. 05/15/16   Marcial Pacas, MD    Family History Family History  Problem Relation Age of Onset  . Diabetes type II Sister   . Coronary artery disease Sister   . Seizures Sister   . Seizures Daughter     Social History Social History  Substance Use Topics  . Smoking status: Never Smoker  . Smokeless tobacco: Never Used  . Alcohol use No     Allergies   Patient has no known allergies.   Review of Systems Review of Systems  Eyes: Positive for pain (L), redness (L) and visual disturbance.  Neurological: Positive for headaches.  All other systems reviewed and are negative.    Physical Exam Updated Vital Signs BP 150/85   Pulse 91   Temp 98.4 F (36.9 C)   Resp 16   Ht 5\' 1"  (1.549 m)   Wt 208 lb (94.3 kg)   SpO2 96%   BMI 39.30 kg/m   Physical Exam  Constitutional: She is oriented to person, place, and time. She appears well-developed and well-nourished. No  distress.  HENT:  Head: Normocephalic and atraumatic.  Eyes: Conjunctivae and EOM are normal.  Neck: Neck supple. No tracheal deviation present.  Cardiovascular: Normal rate.   Pulmonary/Chest: Effort normal. No respiratory distress.  Musculoskeletal: Normal range of motion.  Neurological: She is alert and oriented to person, place, and time.  Skin: Skin is warm and dry.  Psychiatric: She has a normal mood and affect. Her behavior is normal.  Nursing note and vitals reviewed.    ED Treatments / Results  Labs (all labs ordered are listed, but only abnormal results are displayed) Labs Reviewed - No data to display  EKG  EKG Interpretation None  Radiology No results found.  Procedures Procedures (including critical care time)  DIAGNOSTIC STUDIES: Oxygen Saturation is 96% on RA, adequate by my interpretation.    COORDINATION OF CARE: 10:31 PM Discussed treatment plan with pt at bedside and pt agreed to plan.  Medications Ordered in ED Medications - No data to display   Initial Impression / Assessment and Plan / ED Course  I have reviewed the triage vital signs and the nursing notes.  Pertinent labs & imaging results that were available during my care of the patient were reviewed by me and considered in my medical decision making (see chart for details).    Pressure right l left 14  Final Clinical Impressions(s) / ED Diagnoses   Final diagnoses:  Eye irritation    New Prescriptions Discharge Medication List as of 07/03/2016 11:10 PM     I doubt symptoms due to botox as botox ws in November.  I advised pt to call her Optholmologist for eye exa, Meds ordered this encounter  Medications  . tetracaine (PONTOCAINE) 0.5 % ophthalmic solution 1 drop  An After Visit Summary was printed and given to the patient.   Hollace Kinnier [place]pe, PA-C 07/04/16 1610    Julianne Rice, MD 07/05/16 [phone]

## 2016-07-03 NOTE — ED Notes (Signed)
EMT was unaware of EKG due to nurse first putting in the order with no notification.

## 2016-07-03 NOTE — ED Triage Notes (Signed)
Pt taking botox for headaches, pt also having issues with her eyes.  Bilateral corneal implants and retinal hemmorage.   Pt relates that she has gotten injections and laser surgery for bleeding.  Pt is having bloodshot eyes and increased pain to eye since December.  Pt called [facility] and was directed to have them "checked"

## 2016-07-03 NOTE — Discharge Instructions (Signed)
Schedule to see your Opthomologist (eye doctor ) for evaluation

## 2016-07-04 DIAGNOSIS — H401130 Primary open-angle glaucoma, bilateral, stage unspecified: Secondary | ICD-10-CM | POA: Diagnosis not present

## 2016-07-04 DIAGNOSIS — H04123 Dry eye syndrome of bilateral lacrimal glands: Secondary | ICD-10-CM | POA: Diagnosis not present

## 2016-07-24 ENCOUNTER — Ambulatory Visit: Payer: Medicare Other | Admitting: Neurology

## 2016-07-29 DIAGNOSIS — Z9841 Cataract extraction status, right eye: Secondary | ICD-10-CM | POA: Diagnosis not present

## 2016-07-29 DIAGNOSIS — H04123 Dry eye syndrome of bilateral lacrimal glands: Secondary | ICD-10-CM | POA: Diagnosis not present

## 2016-07-29 DIAGNOSIS — Z961 Presence of intraocular lens: Secondary | ICD-10-CM | POA: Diagnosis not present

## 2016-07-29 DIAGNOSIS — H0259 Other disorders affecting eyelid function: Secondary | ICD-10-CM | POA: Diagnosis not present

## 2016-07-29 DIAGNOSIS — H401133 Primary open-angle glaucoma, bilateral, severe stage: Secondary | ICD-10-CM | POA: Diagnosis not present

## 2016-07-29 DIAGNOSIS — Z9842 Cataract extraction status, left eye: Secondary | ICD-10-CM | POA: Diagnosis not present

## 2016-07-29 DIAGNOSIS — G43909 Migraine, unspecified, not intractable, without status migrainosus: Secondary | ICD-10-CM | POA: Diagnosis not present

## 2016-07-29 DIAGNOSIS — E113299 Type 2 diabetes mellitus with mild nonproliferative diabetic retinopathy without macular edema, unspecified eye: Secondary | ICD-10-CM | POA: Diagnosis not present

## 2016-07-29 DIAGNOSIS — Z7982 Long term (current) use of aspirin: Secondary | ICD-10-CM | POA: Diagnosis not present

## 2016-07-29 DIAGNOSIS — I10 Essential (primary) hypertension: Secondary | ICD-10-CM | POA: Diagnosis not present

## 2016-07-29 DIAGNOSIS — Z794 Long term (current) use of insulin: Secondary | ICD-10-CM | POA: Diagnosis not present

## 2016-07-29 DIAGNOSIS — Z79899 Other long term (current) drug therapy: Secondary | ICD-10-CM | POA: Diagnosis not present

## 2016-08-06 DIAGNOSIS — E119 Type 2 diabetes mellitus without complications: Secondary | ICD-10-CM | POA: Diagnosis not present

## 2016-08-06 DIAGNOSIS — Z0000 Encounter for general adult medical examination without abnormal findings: Secondary | ICD-10-CM | POA: Diagnosis not present

## 2016-08-06 DIAGNOSIS — E1140 Type 2 diabetes mellitus with diabetic neuropathy, unspecified: Secondary | ICD-10-CM | POA: Diagnosis not present

## 2016-08-06 DIAGNOSIS — I10 Essential (primary) hypertension: Secondary | ICD-10-CM | POA: Diagnosis not present

## 2016-08-06 DIAGNOSIS — Z1383 Encounter for screening for respiratory disorder NEC: Secondary | ICD-10-CM | POA: Diagnosis not present

## 2016-08-06 DIAGNOSIS — Z01118 Encounter for examination of ears and hearing with other abnormal findings: Secondary | ICD-10-CM | POA: Diagnosis not present

## 2016-08-06 DIAGNOSIS — Z136 Encounter for screening for cardiovascular disorders: Secondary | ICD-10-CM | POA: Diagnosis not present

## 2016-08-06 DIAGNOSIS — H538 Other visual disturbances: Secondary | ICD-10-CM | POA: Diagnosis not present

## 2016-09-02 ENCOUNTER — Other Ambulatory Visit: Payer: Self-pay | Admitting: Nurse Practitioner

## 2016-09-03 DIAGNOSIS — E113593 Type 2 diabetes mellitus with proliferative diabetic retinopathy without macular edema, bilateral: Secondary | ICD-10-CM | POA: Diagnosis not present

## 2016-09-03 DIAGNOSIS — E119 Type 2 diabetes mellitus without complications: Secondary | ICD-10-CM | POA: Diagnosis not present

## 2016-09-03 DIAGNOSIS — R5383 Other fatigue: Secondary | ICD-10-CM | POA: Diagnosis not present

## 2016-09-03 DIAGNOSIS — Z794 Long term (current) use of insulin: Secondary | ICD-10-CM | POA: Diagnosis not present

## 2016-09-03 DIAGNOSIS — E1142 Type 2 diabetes mellitus with diabetic polyneuropathy: Secondary | ICD-10-CM | POA: Diagnosis not present

## 2016-09-03 DIAGNOSIS — E559 Vitamin D deficiency, unspecified: Secondary | ICD-10-CM | POA: Diagnosis not present

## 2016-09-03 DIAGNOSIS — I10 Essential (primary) hypertension: Secondary | ICD-10-CM | POA: Diagnosis not present

## 2016-09-26 DIAGNOSIS — E1140 Type 2 diabetes mellitus with diabetic neuropathy, unspecified: Secondary | ICD-10-CM | POA: Diagnosis not present

## 2016-09-26 DIAGNOSIS — E119 Type 2 diabetes mellitus without complications: Secondary | ICD-10-CM | POA: Diagnosis not present

## 2016-09-26 DIAGNOSIS — M545 Low back pain: Secondary | ICD-10-CM | POA: Diagnosis not present

## 2016-09-26 DIAGNOSIS — R197 Diarrhea, unspecified: Secondary | ICD-10-CM | POA: Diagnosis not present

## 2016-09-26 DIAGNOSIS — I10 Essential (primary) hypertension: Secondary | ICD-10-CM | POA: Diagnosis not present

## 2016-09-26 DIAGNOSIS — G43909 Migraine, unspecified, not intractable, without status migrainosus: Secondary | ICD-10-CM | POA: Diagnosis not present

## 2016-09-26 DIAGNOSIS — R110 Nausea: Secondary | ICD-10-CM | POA: Diagnosis not present

## 2016-10-03 DIAGNOSIS — Z1231 Encounter for screening mammogram for malignant neoplasm of breast: Secondary | ICD-10-CM | POA: Diagnosis not present

## 2016-10-11 DIAGNOSIS — H9201 Otalgia, right ear: Secondary | ICD-10-CM | POA: Diagnosis not present

## 2016-10-11 DIAGNOSIS — I10 Essential (primary) hypertension: Secondary | ICD-10-CM | POA: Diagnosis not present

## 2016-10-11 DIAGNOSIS — J45909 Unspecified asthma, uncomplicated: Secondary | ICD-10-CM | POA: Diagnosis not present

## 2016-10-11 DIAGNOSIS — E119 Type 2 diabetes mellitus without complications: Secondary | ICD-10-CM | POA: Diagnosis not present

## 2016-10-11 DIAGNOSIS — E1140 Type 2 diabetes mellitus with diabetic neuropathy, unspecified: Secondary | ICD-10-CM | POA: Diagnosis not present

## 2016-10-11 DIAGNOSIS — Z0000 Encounter for general adult medical examination without abnormal findings: Secondary | ICD-10-CM | POA: Diagnosis not present

## 2016-10-23 DIAGNOSIS — H5213 Myopia, bilateral: Secondary | ICD-10-CM | POA: Diagnosis not present

## 2016-10-23 DIAGNOSIS — Z794 Long term (current) use of insulin: Secondary | ICD-10-CM | POA: Diagnosis not present

## 2016-10-23 DIAGNOSIS — E113293 Type 2 diabetes mellitus with mild nonproliferative diabetic retinopathy without macular edema, bilateral: Secondary | ICD-10-CM | POA: Diagnosis not present

## 2016-10-23 DIAGNOSIS — H401134 Primary open-angle glaucoma, bilateral, indeterminate stage: Secondary | ICD-10-CM | POA: Diagnosis not present

## 2016-10-23 DIAGNOSIS — H52203 Unspecified astigmatism, bilateral: Secondary | ICD-10-CM | POA: Diagnosis not present

## 2016-11-27 DIAGNOSIS — I10 Essential (primary) hypertension: Secondary | ICD-10-CM | POA: Diagnosis not present

## 2016-11-27 DIAGNOSIS — E1142 Type 2 diabetes mellitus with diabetic polyneuropathy: Secondary | ICD-10-CM | POA: Diagnosis not present

## 2016-11-27 DIAGNOSIS — Z794 Long term (current) use of insulin: Secondary | ICD-10-CM | POA: Diagnosis not present

## 2016-11-27 DIAGNOSIS — E113593 Type 2 diabetes mellitus with proliferative diabetic retinopathy without macular edema, bilateral: Secondary | ICD-10-CM | POA: Diagnosis not present

## 2016-11-27 DIAGNOSIS — E119 Type 2 diabetes mellitus without complications: Secondary | ICD-10-CM | POA: Diagnosis not present

## 2016-12-13 DIAGNOSIS — R0789 Other chest pain: Secondary | ICD-10-CM | POA: Diagnosis not present

## 2016-12-13 DIAGNOSIS — R110 Nausea: Secondary | ICD-10-CM | POA: Diagnosis not present

## 2016-12-13 DIAGNOSIS — R109 Unspecified abdominal pain: Secondary | ICD-10-CM | POA: Diagnosis not present

## 2016-12-13 DIAGNOSIS — R0602 Shortness of breath: Secondary | ICD-10-CM | POA: Diagnosis not present

## 2016-12-13 DIAGNOSIS — R079 Chest pain, unspecified: Secondary | ICD-10-CM | POA: Diagnosis not present

## 2016-12-13 DIAGNOSIS — K219 Gastro-esophageal reflux disease without esophagitis: Secondary | ICD-10-CM | POA: Diagnosis not present

## 2016-12-13 DIAGNOSIS — I517 Cardiomegaly: Secondary | ICD-10-CM | POA: Diagnosis not present

## 2016-12-19 DIAGNOSIS — J45909 Unspecified asthma, uncomplicated: Secondary | ICD-10-CM | POA: Diagnosis not present

## 2016-12-19 DIAGNOSIS — E119 Type 2 diabetes mellitus without complications: Secondary | ICD-10-CM | POA: Diagnosis not present

## 2016-12-19 DIAGNOSIS — I10 Essential (primary) hypertension: Secondary | ICD-10-CM | POA: Diagnosis not present

## 2016-12-19 DIAGNOSIS — I119 Hypertensive heart disease without heart failure: Secondary | ICD-10-CM | POA: Diagnosis not present

## 2016-12-19 DIAGNOSIS — G43909 Migraine, unspecified, not intractable, without status migrainosus: Secondary | ICD-10-CM | POA: Diagnosis not present

## 2016-12-19 DIAGNOSIS — E1140 Type 2 diabetes mellitus with diabetic neuropathy, unspecified: Secondary | ICD-10-CM | POA: Diagnosis not present

## 2016-12-19 DIAGNOSIS — Z0000 Encounter for general adult medical examination without abnormal findings: Secondary | ICD-10-CM | POA: Diagnosis not present

## 2017-01-02 DIAGNOSIS — J45909 Unspecified asthma, uncomplicated: Secondary | ICD-10-CM | POA: Diagnosis not present

## 2017-01-02 DIAGNOSIS — I10 Essential (primary) hypertension: Secondary | ICD-10-CM | POA: Diagnosis not present

## 2017-01-02 DIAGNOSIS — L0231 Cutaneous abscess of buttock: Secondary | ICD-10-CM | POA: Diagnosis not present

## 2017-01-02 DIAGNOSIS — M25562 Pain in left knee: Secondary | ICD-10-CM | POA: Diagnosis not present

## 2017-01-02 DIAGNOSIS — E119 Type 2 diabetes mellitus without complications: Secondary | ICD-10-CM | POA: Diagnosis not present

## 2017-01-10 DIAGNOSIS — G8929 Other chronic pain: Secondary | ICD-10-CM | POA: Diagnosis not present

## 2017-01-10 DIAGNOSIS — M1712 Unilateral primary osteoarthritis, left knee: Secondary | ICD-10-CM | POA: Diagnosis not present

## 2017-01-10 DIAGNOSIS — M25562 Pain in left knee: Secondary | ICD-10-CM | POA: Diagnosis not present

## 2017-01-30 DIAGNOSIS — E1140 Type 2 diabetes mellitus with diabetic neuropathy, unspecified: Secondary | ICD-10-CM | POA: Diagnosis not present

## 2017-01-30 DIAGNOSIS — J45909 Unspecified asthma, uncomplicated: Secondary | ICD-10-CM | POA: Diagnosis not present

## 2017-01-30 DIAGNOSIS — G43909 Migraine, unspecified, not intractable, without status migrainosus: Secondary | ICD-10-CM | POA: Diagnosis not present

## 2017-01-30 DIAGNOSIS — E119 Type 2 diabetes mellitus without complications: Secondary | ICD-10-CM | POA: Diagnosis not present

## 2017-01-30 DIAGNOSIS — I10 Essential (primary) hypertension: Secondary | ICD-10-CM | POA: Diagnosis not present

## 2017-03-12 DIAGNOSIS — Z83511 Family history of glaucoma: Secondary | ICD-10-CM | POA: Diagnosis not present

## 2017-03-12 DIAGNOSIS — H401134 Primary open-angle glaucoma, bilateral, indeterminate stage: Secondary | ICD-10-CM | POA: Diagnosis not present

## 2017-03-17 DIAGNOSIS — G43909 Migraine, unspecified, not intractable, without status migrainosus: Secondary | ICD-10-CM | POA: Diagnosis not present

## 2017-03-17 DIAGNOSIS — I10 Essential (primary) hypertension: Secondary | ICD-10-CM | POA: Diagnosis not present

## 2017-03-17 DIAGNOSIS — J45909 Unspecified asthma, uncomplicated: Secondary | ICD-10-CM | POA: Diagnosis not present

## 2017-03-17 DIAGNOSIS — M545 Low back pain: Secondary | ICD-10-CM | POA: Diagnosis not present

## 2017-03-17 DIAGNOSIS — Z23 Encounter for immunization: Secondary | ICD-10-CM | POA: Diagnosis not present

## 2017-03-17 DIAGNOSIS — E1140 Type 2 diabetes mellitus with diabetic neuropathy, unspecified: Secondary | ICD-10-CM | POA: Diagnosis not present

## 2017-03-31 DIAGNOSIS — E559 Vitamin D deficiency, unspecified: Secondary | ICD-10-CM | POA: Diagnosis not present

## 2017-03-31 DIAGNOSIS — E113553 Type 2 diabetes mellitus with stable proliferative diabetic retinopathy, bilateral: Secondary | ICD-10-CM | POA: Diagnosis not present

## 2017-03-31 DIAGNOSIS — I10 Essential (primary) hypertension: Secondary | ICD-10-CM | POA: Diagnosis not present

## 2017-03-31 DIAGNOSIS — E1142 Type 2 diabetes mellitus with diabetic polyneuropathy: Secondary | ICD-10-CM | POA: Diagnosis not present

## 2017-03-31 DIAGNOSIS — Z794 Long term (current) use of insulin: Secondary | ICD-10-CM | POA: Diagnosis not present

## 2017-04-01 ENCOUNTER — Other Ambulatory Visit: Payer: Self-pay | Admitting: Neurology

## 2017-04-24 DIAGNOSIS — E119 Type 2 diabetes mellitus without complications: Secondary | ICD-10-CM | POA: Diagnosis not present

## 2017-04-24 DIAGNOSIS — E1140 Type 2 diabetes mellitus with diabetic neuropathy, unspecified: Secondary | ICD-10-CM | POA: Diagnosis not present

## 2017-04-24 DIAGNOSIS — M545 Low back pain: Secondary | ICD-10-CM | POA: Diagnosis not present

## 2017-04-24 DIAGNOSIS — G43909 Migraine, unspecified, not intractable, without status migrainosus: Secondary | ICD-10-CM | POA: Diagnosis not present

## 2017-04-24 DIAGNOSIS — J45909 Unspecified asthma, uncomplicated: Secondary | ICD-10-CM | POA: Diagnosis not present

## 2017-04-24 DIAGNOSIS — I10 Essential (primary) hypertension: Secondary | ICD-10-CM | POA: Diagnosis not present

## 2017-06-04 DIAGNOSIS — E119 Type 2 diabetes mellitus without complications: Secondary | ICD-10-CM | POA: Diagnosis not present

## 2017-06-04 DIAGNOSIS — G43909 Migraine, unspecified, not intractable, without status migrainosus: Secondary | ICD-10-CM | POA: Diagnosis not present

## 2017-06-26 DIAGNOSIS — K5900 Constipation, unspecified: Secondary | ICD-10-CM | POA: Diagnosis not present

## 2017-06-26 DIAGNOSIS — G40409 Other generalized epilepsy and epileptic syndromes, not intractable, without status epilepticus: Secondary | ICD-10-CM | POA: Diagnosis not present

## 2017-06-26 DIAGNOSIS — E1140 Type 2 diabetes mellitus with diabetic neuropathy, unspecified: Secondary | ICD-10-CM | POA: Diagnosis not present

## 2017-06-26 DIAGNOSIS — M7989 Other specified soft tissue disorders: Secondary | ICD-10-CM | POA: Diagnosis not present

## 2017-06-26 DIAGNOSIS — R569 Unspecified convulsions: Secondary | ICD-10-CM | POA: Diagnosis not present

## 2017-06-26 DIAGNOSIS — E1142 Type 2 diabetes mellitus with diabetic polyneuropathy: Secondary | ICD-10-CM | POA: Diagnosis not present

## 2017-06-26 DIAGNOSIS — Z794 Long term (current) use of insulin: Secondary | ICD-10-CM | POA: Diagnosis not present

## 2017-06-26 DIAGNOSIS — I10 Essential (primary) hypertension: Secondary | ICD-10-CM | POA: Diagnosis not present

## 2017-06-26 DIAGNOSIS — E11319 Type 2 diabetes mellitus with unspecified diabetic retinopathy without macular edema: Secondary | ICD-10-CM | POA: Diagnosis not present

## 2017-06-26 DIAGNOSIS — R072 Precordial pain: Secondary | ICD-10-CM | POA: Diagnosis not present

## 2017-06-26 DIAGNOSIS — R05 Cough: Secondary | ICD-10-CM | POA: Diagnosis not present

## 2017-06-26 DIAGNOSIS — R079 Chest pain, unspecified: Secondary | ICD-10-CM | POA: Diagnosis not present

## 2017-06-26 DIAGNOSIS — M545 Low back pain: Secondary | ICD-10-CM | POA: Diagnosis not present

## 2017-06-26 DIAGNOSIS — J45909 Unspecified asthma, uncomplicated: Secondary | ICD-10-CM | POA: Diagnosis not present

## 2017-06-26 DIAGNOSIS — E669 Obesity, unspecified: Secondary | ICD-10-CM | POA: Diagnosis not present

## 2017-06-26 DIAGNOSIS — R0602 Shortness of breath: Secondary | ICD-10-CM | POA: Diagnosis not present

## 2017-06-26 DIAGNOSIS — G43909 Migraine, unspecified, not intractable, without status migrainosus: Secondary | ICD-10-CM | POA: Diagnosis not present

## 2017-06-26 DIAGNOSIS — E119 Type 2 diabetes mellitus without complications: Secondary | ICD-10-CM | POA: Diagnosis not present

## 2017-06-26 DIAGNOSIS — R0789 Other chest pain: Secondary | ICD-10-CM | POA: Diagnosis not present

## 2017-06-26 DIAGNOSIS — G40909 Epilepsy, unspecified, not intractable, without status epilepticus: Secondary | ICD-10-CM | POA: Diagnosis not present

## 2017-06-26 DIAGNOSIS — G43709 Chronic migraine without aura, not intractable, without status migrainosus: Secondary | ICD-10-CM | POA: Diagnosis not present

## 2017-06-26 DIAGNOSIS — R2689 Other abnormalities of gait and mobility: Secondary | ICD-10-CM | POA: Diagnosis not present

## 2017-06-27 DIAGNOSIS — Z7982 Long term (current) use of aspirin: Secondary | ICD-10-CM | POA: Diagnosis not present

## 2017-06-27 DIAGNOSIS — G8929 Other chronic pain: Secondary | ICD-10-CM | POA: Diagnosis not present

## 2017-06-27 DIAGNOSIS — G40409 Other generalized epilepsy and epileptic syndromes, not intractable, without status epilepticus: Secondary | ICD-10-CM | POA: Diagnosis not present

## 2017-06-27 DIAGNOSIS — M545 Low back pain: Secondary | ICD-10-CM | POA: Diagnosis not present

## 2017-06-27 DIAGNOSIS — E1142 Type 2 diabetes mellitus with diabetic polyneuropathy: Secondary | ICD-10-CM | POA: Diagnosis not present

## 2017-06-27 DIAGNOSIS — I700 Atherosclerosis of aorta: Secondary | ICD-10-CM | POA: Diagnosis not present

## 2017-06-27 DIAGNOSIS — R079 Chest pain, unspecified: Secondary | ICD-10-CM | POA: Diagnosis not present

## 2017-06-27 DIAGNOSIS — Z7409 Other reduced mobility: Secondary | ICD-10-CM | POA: Diagnosis not present

## 2017-06-27 DIAGNOSIS — G43709 Chronic migraine without aura, not intractable, without status migrainosus: Secondary | ICD-10-CM | POA: Diagnosis not present

## 2017-06-27 DIAGNOSIS — I10 Essential (primary) hypertension: Secondary | ICD-10-CM | POA: Diagnosis not present

## 2017-06-28 DIAGNOSIS — E1142 Type 2 diabetes mellitus with diabetic polyneuropathy: Secondary | ICD-10-CM | POA: Diagnosis not present

## 2017-06-28 DIAGNOSIS — G40409 Other generalized epilepsy and epileptic syndromes, not intractable, without status epilepticus: Secondary | ICD-10-CM | POA: Diagnosis not present

## 2017-06-28 DIAGNOSIS — G43709 Chronic migraine without aura, not intractable, without status migrainosus: Secondary | ICD-10-CM | POA: Diagnosis not present

## 2017-06-28 DIAGNOSIS — Z7409 Other reduced mobility: Secondary | ICD-10-CM | POA: Diagnosis not present

## 2017-06-28 DIAGNOSIS — R079 Chest pain, unspecified: Secondary | ICD-10-CM | POA: Diagnosis not present

## 2017-06-28 DIAGNOSIS — M545 Low back pain: Secondary | ICD-10-CM | POA: Diagnosis not present

## 2017-06-28 DIAGNOSIS — I10 Essential (primary) hypertension: Secondary | ICD-10-CM | POA: Diagnosis not present

## 2017-06-28 DIAGNOSIS — Z7982 Long term (current) use of aspirin: Secondary | ICD-10-CM | POA: Diagnosis not present

## 2017-06-28 DIAGNOSIS — G8929 Other chronic pain: Secondary | ICD-10-CM | POA: Diagnosis not present

## 2017-06-29 DIAGNOSIS — R079 Chest pain, unspecified: Secondary | ICD-10-CM | POA: Diagnosis not present

## 2017-06-29 DIAGNOSIS — G43709 Chronic migraine without aura, not intractable, without status migrainosus: Secondary | ICD-10-CM | POA: Diagnosis not present

## 2017-06-29 DIAGNOSIS — G8929 Other chronic pain: Secondary | ICD-10-CM | POA: Diagnosis not present

## 2017-06-29 DIAGNOSIS — G40409 Other generalized epilepsy and epileptic syndromes, not intractable, without status epilepticus: Secondary | ICD-10-CM | POA: Diagnosis not present

## 2017-06-29 DIAGNOSIS — M545 Low back pain: Secondary | ICD-10-CM | POA: Diagnosis not present

## 2017-06-29 DIAGNOSIS — Z7409 Other reduced mobility: Secondary | ICD-10-CM | POA: Diagnosis not present

## 2017-06-29 DIAGNOSIS — E1142 Type 2 diabetes mellitus with diabetic polyneuropathy: Secondary | ICD-10-CM | POA: Diagnosis not present

## 2017-06-29 DIAGNOSIS — I10 Essential (primary) hypertension: Secondary | ICD-10-CM | POA: Diagnosis not present

## 2017-06-29 DIAGNOSIS — Z7982 Long term (current) use of aspirin: Secondary | ICD-10-CM | POA: Diagnosis not present

## 2017-06-30 DIAGNOSIS — M545 Low back pain: Secondary | ICD-10-CM | POA: Diagnosis not present

## 2017-06-30 DIAGNOSIS — R079 Chest pain, unspecified: Secondary | ICD-10-CM | POA: Diagnosis not present

## 2017-06-30 DIAGNOSIS — G40409 Other generalized epilepsy and epileptic syndromes, not intractable, without status epilepticus: Secondary | ICD-10-CM | POA: Diagnosis not present

## 2017-06-30 DIAGNOSIS — E1142 Type 2 diabetes mellitus with diabetic polyneuropathy: Secondary | ICD-10-CM | POA: Diagnosis not present

## 2017-06-30 DIAGNOSIS — I10 Essential (primary) hypertension: Secondary | ICD-10-CM | POA: Diagnosis not present

## 2017-06-30 DIAGNOSIS — Z7409 Other reduced mobility: Secondary | ICD-10-CM | POA: Diagnosis not present

## 2017-06-30 DIAGNOSIS — G8929 Other chronic pain: Secondary | ICD-10-CM | POA: Diagnosis not present

## 2017-06-30 DIAGNOSIS — Z7982 Long term (current) use of aspirin: Secondary | ICD-10-CM | POA: Diagnosis not present

## 2017-06-30 DIAGNOSIS — G43709 Chronic migraine without aura, not intractable, without status migrainosus: Secondary | ICD-10-CM | POA: Diagnosis not present

## 2017-07-01 DIAGNOSIS — I10 Essential (primary) hypertension: Secondary | ICD-10-CM | POA: Diagnosis not present

## 2017-07-01 DIAGNOSIS — E1142 Type 2 diabetes mellitus with diabetic polyneuropathy: Secondary | ICD-10-CM | POA: Diagnosis not present

## 2017-07-01 DIAGNOSIS — K5900 Constipation, unspecified: Secondary | ICD-10-CM | POA: Diagnosis not present

## 2017-07-01 DIAGNOSIS — R079 Chest pain, unspecified: Secondary | ICD-10-CM | POA: Diagnosis not present

## 2017-07-01 DIAGNOSIS — G43909 Migraine, unspecified, not intractable, without status migrainosus: Secondary | ICD-10-CM | POA: Diagnosis not present

## 2017-07-01 DIAGNOSIS — M545 Low back pain: Secondary | ICD-10-CM | POA: Diagnosis not present

## 2017-07-01 DIAGNOSIS — M6281 Muscle weakness (generalized): Secondary | ICD-10-CM | POA: Diagnosis not present

## 2017-07-02 DIAGNOSIS — E119 Type 2 diabetes mellitus without complications: Secondary | ICD-10-CM | POA: Diagnosis not present

## 2017-07-02 DIAGNOSIS — R079 Chest pain, unspecified: Secondary | ICD-10-CM | POA: Diagnosis not present

## 2017-07-02 DIAGNOSIS — K5900 Constipation, unspecified: Secondary | ICD-10-CM | POA: Diagnosis not present

## 2017-07-02 DIAGNOSIS — I10 Essential (primary) hypertension: Secondary | ICD-10-CM | POA: Diagnosis not present

## 2017-07-02 DIAGNOSIS — G43709 Chronic migraine without aura, not intractable, without status migrainosus: Secondary | ICD-10-CM | POA: Diagnosis not present

## 2017-07-02 DIAGNOSIS — G43909 Migraine, unspecified, not intractable, without status migrainosus: Secondary | ICD-10-CM | POA: Diagnosis not present

## 2017-07-02 DIAGNOSIS — E11319 Type 2 diabetes mellitus with unspecified diabetic retinopathy without macular edema: Secondary | ICD-10-CM | POA: Diagnosis not present

## 2017-07-02 DIAGNOSIS — R1312 Dysphagia, oropharyngeal phase: Secondary | ICD-10-CM | POA: Diagnosis not present

## 2017-07-02 DIAGNOSIS — G40909 Epilepsy, unspecified, not intractable, without status epilepticus: Secondary | ICD-10-CM | POA: Diagnosis not present

## 2017-07-02 DIAGNOSIS — R41841 Cognitive communication deficit: Secondary | ICD-10-CM | POA: Diagnosis not present

## 2017-07-02 DIAGNOSIS — G40409 Other generalized epilepsy and epileptic syndromes, not intractable, without status epilepticus: Secondary | ICD-10-CM | POA: Diagnosis not present

## 2017-07-02 DIAGNOSIS — R0789 Other chest pain: Secondary | ICD-10-CM | POA: Diagnosis not present

## 2017-07-02 DIAGNOSIS — M6281 Muscle weakness (generalized): Secondary | ICD-10-CM | POA: Diagnosis not present

## 2017-07-02 DIAGNOSIS — E1142 Type 2 diabetes mellitus with diabetic polyneuropathy: Secondary | ICD-10-CM | POA: Diagnosis not present

## 2017-07-02 DIAGNOSIS — R2689 Other abnormalities of gait and mobility: Secondary | ICD-10-CM | POA: Diagnosis not present

## 2017-07-02 DIAGNOSIS — M545 Low back pain: Secondary | ICD-10-CM | POA: Diagnosis not present

## 2017-07-02 DIAGNOSIS — Z794 Long term (current) use of insulin: Secondary | ICD-10-CM | POA: Diagnosis not present

## 2017-07-03 DIAGNOSIS — I10 Essential (primary) hypertension: Secondary | ICD-10-CM | POA: Diagnosis not present

## 2017-07-03 DIAGNOSIS — E1142 Type 2 diabetes mellitus with diabetic polyneuropathy: Secondary | ICD-10-CM | POA: Diagnosis not present

## 2017-07-03 DIAGNOSIS — G40909 Epilepsy, unspecified, not intractable, without status epilepticus: Secondary | ICD-10-CM | POA: Diagnosis not present

## 2017-07-03 DIAGNOSIS — R0789 Other chest pain: Secondary | ICD-10-CM | POA: Diagnosis not present

## 2017-07-04 DIAGNOSIS — R2689 Other abnormalities of gait and mobility: Secondary | ICD-10-CM | POA: Diagnosis not present

## 2017-07-04 DIAGNOSIS — R079 Chest pain, unspecified: Secondary | ICD-10-CM | POA: Diagnosis not present

## 2017-07-04 DIAGNOSIS — M545 Low back pain: Secondary | ICD-10-CM | POA: Diagnosis not present

## 2017-07-04 DIAGNOSIS — E1142 Type 2 diabetes mellitus with diabetic polyneuropathy: Secondary | ICD-10-CM | POA: Diagnosis not present

## 2017-07-15 DIAGNOSIS — E1142 Type 2 diabetes mellitus with diabetic polyneuropathy: Secondary | ICD-10-CM | POA: Diagnosis not present

## 2017-07-15 DIAGNOSIS — R079 Chest pain, unspecified: Secondary | ICD-10-CM | POA: Diagnosis not present

## 2017-07-15 DIAGNOSIS — M545 Low back pain: Secondary | ICD-10-CM | POA: Diagnosis not present

## 2017-07-15 DIAGNOSIS — R2689 Other abnormalities of gait and mobility: Secondary | ICD-10-CM | POA: Diagnosis not present

## 2017-07-18 ENCOUNTER — Other Ambulatory Visit: Payer: Self-pay | Admitting: Neurology

## 2017-07-18 ENCOUNTER — Other Ambulatory Visit: Payer: Self-pay | Admitting: Nurse Practitioner

## 2017-07-21 ENCOUNTER — Other Ambulatory Visit: Payer: Self-pay

## 2017-07-21 DIAGNOSIS — R079 Chest pain, unspecified: Secondary | ICD-10-CM | POA: Diagnosis not present

## 2017-07-21 DIAGNOSIS — E118 Type 2 diabetes mellitus with unspecified complications: Secondary | ICD-10-CM | POA: Diagnosis not present

## 2017-07-21 DIAGNOSIS — M545 Low back pain: Secondary | ICD-10-CM | POA: Diagnosis not present

## 2017-07-21 DIAGNOSIS — G40909 Epilepsy, unspecified, not intractable, without status epilepticus: Secondary | ICD-10-CM | POA: Diagnosis not present

## 2017-07-21 DIAGNOSIS — R1030 Lower abdominal pain, unspecified: Secondary | ICD-10-CM | POA: Diagnosis not present

## 2017-07-21 NOTE — Patient Outreach (Signed)
[facility]) Care Management  07/21/2017  [name] [name] 25-May-1946 [id number]     Transition of Care Referral  Referral Date: 07/21/17 Referral Source: [facility] Discharge Report Date of Admission: unknown Diagnosis: unknown Date of Discharge: 07/18/17 Facility: [facility]: [facility]    Outreach attempt # 1 to patient. No answer. RN CM left HIPAA compliant voicemail message along with contact info.    Plan: RN CM will make outreach attempt to patient within one business day if no return call from patient.    Enzo Montgomery, RN,BSN,CCM [facility] Management Telephonic Care Management Coordinator Direct Phone: [phone] Toll Free: [phone] Fax: [phone]

## 2017-07-22 ENCOUNTER — Other Ambulatory Visit: Payer: Self-pay

## 2017-07-22 NOTE — Patient Outreach (Addendum)
[facility]) Care Management  07/22/2017  Ivana Nicastro [birth year] [id number]   Transition of Care Referral  Referral Date: 07/21/17 Referral Source: [facility] Discharge Report Date of Admission: unknown Diagnosis: unknown Date of Discharge: 07/18/17 Facility: [facility] [place]: [facility]   Outreach attempt #2 to patient. No answer at present.     Plan: RN CM will send unsuccessful outreach letter to patient, make final call attempt and close case if no response within 10 business days.  Enzo Montgomery, RN,BSN,CCM [facility] Management Telephonic Care Management Coordinator Direct Phone: [phone] Toll Free: [phone] Fax: [phone]

## 2017-07-30 DIAGNOSIS — G4733 Obstructive sleep apnea (adult) (pediatric): Secondary | ICD-10-CM | POA: Diagnosis not present

## 2017-07-30 DIAGNOSIS — M545 Low back pain: Secondary | ICD-10-CM | POA: Diagnosis not present

## 2017-07-30 DIAGNOSIS — M6281 Muscle weakness (generalized): Secondary | ICD-10-CM | POA: Diagnosis not present

## 2017-07-30 DIAGNOSIS — E1140 Type 2 diabetes mellitus with diabetic neuropathy, unspecified: Secondary | ICD-10-CM | POA: Diagnosis not present

## 2017-07-30 DIAGNOSIS — G43709 Chronic migraine without aura, not intractable, without status migrainosus: Secondary | ICD-10-CM | POA: Diagnosis not present

## 2017-07-30 DIAGNOSIS — G40409 Other generalized epilepsy and epileptic syndromes, not intractable, without status epilepticus: Secondary | ICD-10-CM | POA: Diagnosis not present

## 2017-07-30 DIAGNOSIS — G8929 Other chronic pain: Secondary | ICD-10-CM | POA: Diagnosis not present

## 2017-07-30 DIAGNOSIS — I10 Essential (primary) hypertension: Secondary | ICD-10-CM | POA: Diagnosis not present

## 2017-07-30 DIAGNOSIS — E119 Type 2 diabetes mellitus without complications: Secondary | ICD-10-CM | POA: Diagnosis not present

## 2017-07-31 DIAGNOSIS — E669 Obesity, unspecified: Secondary | ICD-10-CM | POA: Diagnosis not present

## 2017-07-31 DIAGNOSIS — I10 Essential (primary) hypertension: Secondary | ICD-10-CM | POA: Diagnosis not present

## 2017-07-31 DIAGNOSIS — E1140 Type 2 diabetes mellitus with diabetic neuropathy, unspecified: Secondary | ICD-10-CM | POA: Diagnosis not present

## 2017-07-31 DIAGNOSIS — H6691 Otitis media, unspecified, right ear: Secondary | ICD-10-CM | POA: Diagnosis not present

## 2017-07-31 DIAGNOSIS — J45909 Unspecified asthma, uncomplicated: Secondary | ICD-10-CM | POA: Diagnosis not present

## 2017-07-31 DIAGNOSIS — M545 Low back pain: Secondary | ICD-10-CM | POA: Diagnosis not present

## 2017-07-31 DIAGNOSIS — R569 Unspecified convulsions: Secondary | ICD-10-CM | POA: Diagnosis not present

## 2017-07-31 DIAGNOSIS — E119 Type 2 diabetes mellitus without complications: Secondary | ICD-10-CM | POA: Diagnosis not present

## 2017-07-31 DIAGNOSIS — G43909 Migraine, unspecified, not intractable, without status migrainosus: Secondary | ICD-10-CM | POA: Diagnosis not present

## 2017-08-01 DIAGNOSIS — M6281 Muscle weakness (generalized): Secondary | ICD-10-CM | POA: Diagnosis not present

## 2017-08-01 DIAGNOSIS — I10 Essential (primary) hypertension: Secondary | ICD-10-CM | POA: Diagnosis not present

## 2017-08-01 DIAGNOSIS — G43709 Chronic migraine without aura, not intractable, without status migrainosus: Secondary | ICD-10-CM | POA: Diagnosis not present

## 2017-08-01 DIAGNOSIS — G4733 Obstructive sleep apnea (adult) (pediatric): Secondary | ICD-10-CM | POA: Diagnosis not present

## 2017-08-01 DIAGNOSIS — M545 Low back pain: Secondary | ICD-10-CM | POA: Diagnosis not present

## 2017-08-01 DIAGNOSIS — E1140 Type 2 diabetes mellitus with diabetic neuropathy, unspecified: Secondary | ICD-10-CM | POA: Diagnosis not present

## 2017-08-01 DIAGNOSIS — G8929 Other chronic pain: Secondary | ICD-10-CM | POA: Diagnosis not present

## 2017-08-01 DIAGNOSIS — G40409 Other generalized epilepsy and epileptic syndromes, not intractable, without status epilepticus: Secondary | ICD-10-CM | POA: Diagnosis not present

## 2017-08-04 ENCOUNTER — Other Ambulatory Visit: Payer: Self-pay

## 2017-08-04 DIAGNOSIS — E1142 Type 2 diabetes mellitus with diabetic polyneuropathy: Secondary | ICD-10-CM | POA: Diagnosis not present

## 2017-08-04 DIAGNOSIS — Z7409 Other reduced mobility: Secondary | ICD-10-CM | POA: Diagnosis not present

## 2017-08-04 DIAGNOSIS — Z6837 Body mass index (BMI) 37.0-37.9, adult: Secondary | ICD-10-CM | POA: Diagnosis not present

## 2017-08-04 DIAGNOSIS — Z794 Long term (current) use of insulin: Secondary | ICD-10-CM | POA: Diagnosis not present

## 2017-08-04 DIAGNOSIS — Z634 Disappearance and death of family member: Secondary | ICD-10-CM | POA: Diagnosis not present

## 2017-08-04 DIAGNOSIS — I10 Essential (primary) hypertension: Secondary | ICD-10-CM | POA: Diagnosis not present

## 2017-08-04 NOTE — Patient Outreach (Signed)
[facility]) Care Management  08/04/2017  [name] [name] 1945-08-28 [id number]   Transition of Care Referral  Referral Date:07/21/17 Referral Source:[employer] Discharge Report Date of Admission:unknown Diagnosis:unknown Date of Discharge:07/18/17 Facility:[facility]   Outreach attempt #3 to patient. Spoke with patient who did not wish to talk long she voices she is getting dressed and headed to MD appt. She confirms that she ha reliable transportation to get her to appts. She voices she has support in the home to assist her as needed. She denies any issues or concerns regarding her meds and confirmed that she has all her meds in the home. She voices no current symptoms and/or worsening problems. She is aware of when to seek medical attention for changes in condition.     Plan: RN CM will notify [facility] administrative assistant of case status.    Enzo Montgomery, RN,BSN,CCM [place] Management Telephonic Care Management Coordinator Direct Phone: [phone] Toll Free: [phone] Fax: [phone]

## 2017-08-05 DIAGNOSIS — G8929 Other chronic pain: Secondary | ICD-10-CM | POA: Diagnosis not present

## 2017-08-05 DIAGNOSIS — M545 Low back pain: Secondary | ICD-10-CM | POA: Diagnosis not present

## 2017-08-05 DIAGNOSIS — E1140 Type 2 diabetes mellitus with diabetic neuropathy, unspecified: Secondary | ICD-10-CM | POA: Diagnosis not present

## 2017-08-05 DIAGNOSIS — G40409 Other generalized epilepsy and epileptic syndromes, not intractable, without status epilepticus: Secondary | ICD-10-CM | POA: Diagnosis not present

## 2017-08-05 DIAGNOSIS — M6281 Muscle weakness (generalized): Secondary | ICD-10-CM | POA: Diagnosis not present

## 2017-08-05 DIAGNOSIS — G43709 Chronic migraine without aura, not intractable, without status migrainosus: Secondary | ICD-10-CM | POA: Diagnosis not present

## 2017-08-05 DIAGNOSIS — I10 Essential (primary) hypertension: Secondary | ICD-10-CM | POA: Diagnosis not present

## 2017-08-05 DIAGNOSIS — G4733 Obstructive sleep apnea (adult) (pediatric): Secondary | ICD-10-CM | POA: Diagnosis not present

## 2017-08-06 DIAGNOSIS — M6281 Muscle weakness (generalized): Secondary | ICD-10-CM | POA: Diagnosis not present

## 2017-08-06 DIAGNOSIS — G4733 Obstructive sleep apnea (adult) (pediatric): Secondary | ICD-10-CM | POA: Diagnosis not present

## 2017-08-06 DIAGNOSIS — G8929 Other chronic pain: Secondary | ICD-10-CM | POA: Diagnosis not present

## 2017-08-06 DIAGNOSIS — M545 Low back pain: Secondary | ICD-10-CM | POA: Diagnosis not present

## 2017-08-06 DIAGNOSIS — G43709 Chronic migraine without aura, not intractable, without status migrainosus: Secondary | ICD-10-CM | POA: Diagnosis not present

## 2017-08-06 DIAGNOSIS — G40409 Other generalized epilepsy and epileptic syndromes, not intractable, without status epilepticus: Secondary | ICD-10-CM | POA: Diagnosis not present

## 2017-08-06 DIAGNOSIS — I10 Essential (primary) hypertension: Secondary | ICD-10-CM | POA: Diagnosis not present

## 2017-08-06 DIAGNOSIS — E1140 Type 2 diabetes mellitus with diabetic neuropathy, unspecified: Secondary | ICD-10-CM | POA: Diagnosis not present

## 2017-08-07 DIAGNOSIS — G43709 Chronic migraine without aura, not intractable, without status migrainosus: Secondary | ICD-10-CM | POA: Diagnosis not present

## 2017-08-07 DIAGNOSIS — M545 Low back pain: Secondary | ICD-10-CM | POA: Diagnosis not present

## 2017-08-07 DIAGNOSIS — G4733 Obstructive sleep apnea (adult) (pediatric): Secondary | ICD-10-CM | POA: Diagnosis not present

## 2017-08-07 DIAGNOSIS — M6281 Muscle weakness (generalized): Secondary | ICD-10-CM | POA: Diagnosis not present

## 2017-08-07 DIAGNOSIS — I10 Essential (primary) hypertension: Secondary | ICD-10-CM | POA: Diagnosis not present

## 2017-08-07 DIAGNOSIS — G40409 Other generalized epilepsy and epileptic syndromes, not intractable, without status epilepticus: Secondary | ICD-10-CM | POA: Diagnosis not present

## 2017-08-07 DIAGNOSIS — G8929 Other chronic pain: Secondary | ICD-10-CM | POA: Diagnosis not present

## 2017-08-07 DIAGNOSIS — E1140 Type 2 diabetes mellitus with diabetic neuropathy, unspecified: Secondary | ICD-10-CM | POA: Diagnosis not present

## 2017-08-08 DIAGNOSIS — M6281 Muscle weakness (generalized): Secondary | ICD-10-CM | POA: Diagnosis not present

## 2017-08-08 DIAGNOSIS — G4733 Obstructive sleep apnea (adult) (pediatric): Secondary | ICD-10-CM | POA: Diagnosis not present

## 2017-08-08 DIAGNOSIS — I10 Essential (primary) hypertension: Secondary | ICD-10-CM | POA: Diagnosis not present

## 2017-08-08 DIAGNOSIS — G8929 Other chronic pain: Secondary | ICD-10-CM | POA: Diagnosis not present

## 2017-08-08 DIAGNOSIS — G43709 Chronic migraine without aura, not intractable, without status migrainosus: Secondary | ICD-10-CM | POA: Diagnosis not present

## 2017-08-08 DIAGNOSIS — M545 Low back pain: Secondary | ICD-10-CM | POA: Diagnosis not present

## 2017-08-08 DIAGNOSIS — E1140 Type 2 diabetes mellitus with diabetic neuropathy, unspecified: Secondary | ICD-10-CM | POA: Diagnosis not present

## 2017-08-08 DIAGNOSIS — G40409 Other generalized epilepsy and epileptic syndromes, not intractable, without status epilepticus: Secondary | ICD-10-CM | POA: Diagnosis not present

## 2017-08-12 DIAGNOSIS — M6281 Muscle weakness (generalized): Secondary | ICD-10-CM | POA: Diagnosis not present

## 2017-08-12 DIAGNOSIS — G40409 Other generalized epilepsy and epileptic syndromes, not intractable, without status epilepticus: Secondary | ICD-10-CM | POA: Diagnosis not present

## 2017-08-12 DIAGNOSIS — E1140 Type 2 diabetes mellitus with diabetic neuropathy, unspecified: Secondary | ICD-10-CM | POA: Diagnosis not present

## 2017-08-12 DIAGNOSIS — M545 Low back pain: Secondary | ICD-10-CM | POA: Diagnosis not present

## 2017-08-12 DIAGNOSIS — I10 Essential (primary) hypertension: Secondary | ICD-10-CM | POA: Diagnosis not present

## 2017-08-12 DIAGNOSIS — G4733 Obstructive sleep apnea (adult) (pediatric): Secondary | ICD-10-CM | POA: Diagnosis not present

## 2017-08-12 DIAGNOSIS — G8929 Other chronic pain: Secondary | ICD-10-CM | POA: Diagnosis not present

## 2017-08-12 DIAGNOSIS — G43709 Chronic migraine without aura, not intractable, without status migrainosus: Secondary | ICD-10-CM | POA: Diagnosis not present

## 2017-08-13 DIAGNOSIS — E1140 Type 2 diabetes mellitus with diabetic neuropathy, unspecified: Secondary | ICD-10-CM | POA: Diagnosis not present

## 2017-08-13 DIAGNOSIS — M545 Low back pain: Secondary | ICD-10-CM | POA: Diagnosis not present

## 2017-08-13 DIAGNOSIS — E119 Type 2 diabetes mellitus without complications: Secondary | ICD-10-CM | POA: Diagnosis not present

## 2017-08-13 DIAGNOSIS — I10 Essential (primary) hypertension: Secondary | ICD-10-CM | POA: Diagnosis not present

## 2017-08-13 DIAGNOSIS — M6281 Muscle weakness (generalized): Secondary | ICD-10-CM | POA: Diagnosis not present

## 2017-08-13 DIAGNOSIS — G43709 Chronic migraine without aura, not intractable, without status migrainosus: Secondary | ICD-10-CM | POA: Diagnosis not present

## 2017-08-13 DIAGNOSIS — G4733 Obstructive sleep apnea (adult) (pediatric): Secondary | ICD-10-CM | POA: Diagnosis not present

## 2017-08-13 DIAGNOSIS — G8929 Other chronic pain: Secondary | ICD-10-CM | POA: Diagnosis not present

## 2017-08-13 DIAGNOSIS — G40409 Other generalized epilepsy and epileptic syndromes, not intractable, without status epilepticus: Secondary | ICD-10-CM | POA: Diagnosis not present

## 2017-08-14 DIAGNOSIS — G8929 Other chronic pain: Secondary | ICD-10-CM | POA: Diagnosis not present

## 2017-08-14 DIAGNOSIS — E1140 Type 2 diabetes mellitus with diabetic neuropathy, unspecified: Secondary | ICD-10-CM | POA: Diagnosis not present

## 2017-08-14 DIAGNOSIS — G43709 Chronic migraine without aura, not intractable, without status migrainosus: Secondary | ICD-10-CM | POA: Diagnosis not present

## 2017-08-14 DIAGNOSIS — I10 Essential (primary) hypertension: Secondary | ICD-10-CM | POA: Diagnosis not present

## 2017-08-14 DIAGNOSIS — M545 Low back pain: Secondary | ICD-10-CM | POA: Diagnosis not present

## 2017-08-14 DIAGNOSIS — G40409 Other generalized epilepsy and epileptic syndromes, not intractable, without status epilepticus: Secondary | ICD-10-CM | POA: Diagnosis not present

## 2017-08-14 DIAGNOSIS — M6281 Muscle weakness (generalized): Secondary | ICD-10-CM | POA: Diagnosis not present

## 2017-08-14 DIAGNOSIS — G4733 Obstructive sleep apnea (adult) (pediatric): Secondary | ICD-10-CM | POA: Diagnosis not present

## 2017-08-15 DIAGNOSIS — E1140 Type 2 diabetes mellitus with diabetic neuropathy, unspecified: Secondary | ICD-10-CM | POA: Diagnosis not present

## 2017-08-15 DIAGNOSIS — G43709 Chronic migraine without aura, not intractable, without status migrainosus: Secondary | ICD-10-CM | POA: Diagnosis not present

## 2017-08-15 DIAGNOSIS — M545 Low back pain: Secondary | ICD-10-CM | POA: Diagnosis not present

## 2017-08-15 DIAGNOSIS — M6281 Muscle weakness (generalized): Secondary | ICD-10-CM | POA: Diagnosis not present

## 2017-08-15 DIAGNOSIS — G4733 Obstructive sleep apnea (adult) (pediatric): Secondary | ICD-10-CM | POA: Diagnosis not present

## 2017-08-15 DIAGNOSIS — G40409 Other generalized epilepsy and epileptic syndromes, not intractable, without status epilepticus: Secondary | ICD-10-CM | POA: Diagnosis not present

## 2017-08-15 DIAGNOSIS — I10 Essential (primary) hypertension: Secondary | ICD-10-CM | POA: Diagnosis not present

## 2017-08-15 DIAGNOSIS — G8929 Other chronic pain: Secondary | ICD-10-CM | POA: Diagnosis not present

## 2017-08-18 DIAGNOSIS — R079 Chest pain, unspecified: Secondary | ICD-10-CM | POA: Diagnosis not present

## 2017-08-18 DIAGNOSIS — E118 Type 2 diabetes mellitus with unspecified complications: Secondary | ICD-10-CM | POA: Diagnosis not present

## 2017-08-18 DIAGNOSIS — R1030 Lower abdominal pain, unspecified: Secondary | ICD-10-CM | POA: Diagnosis not present

## 2017-08-18 DIAGNOSIS — G40909 Epilepsy, unspecified, not intractable, without status epilepticus: Secondary | ICD-10-CM | POA: Diagnosis not present

## 2017-08-18 DIAGNOSIS — M545 Low back pain: Secondary | ICD-10-CM | POA: Diagnosis not present

## 2017-08-20 DIAGNOSIS — G40409 Other generalized epilepsy and epileptic syndromes, not intractable, without status epilepticus: Secondary | ICD-10-CM | POA: Diagnosis not present

## 2017-08-20 DIAGNOSIS — I10 Essential (primary) hypertension: Secondary | ICD-10-CM | POA: Diagnosis not present

## 2017-08-20 DIAGNOSIS — M545 Low back pain: Secondary | ICD-10-CM | POA: Diagnosis not present

## 2017-08-20 DIAGNOSIS — G4733 Obstructive sleep apnea (adult) (pediatric): Secondary | ICD-10-CM | POA: Diagnosis not present

## 2017-08-20 DIAGNOSIS — G43709 Chronic migraine without aura, not intractable, without status migrainosus: Secondary | ICD-10-CM | POA: Diagnosis not present

## 2017-08-20 DIAGNOSIS — G8929 Other chronic pain: Secondary | ICD-10-CM | POA: Diagnosis not present

## 2017-08-20 DIAGNOSIS — E1140 Type 2 diabetes mellitus with diabetic neuropathy, unspecified: Secondary | ICD-10-CM | POA: Diagnosis not present

## 2017-08-20 DIAGNOSIS — M6281 Muscle weakness (generalized): Secondary | ICD-10-CM | POA: Diagnosis not present

## 2017-08-26 DIAGNOSIS — G40409 Other generalized epilepsy and epileptic syndromes, not intractable, without status epilepticus: Secondary | ICD-10-CM | POA: Diagnosis not present

## 2017-08-26 DIAGNOSIS — G8929 Other chronic pain: Secondary | ICD-10-CM | POA: Diagnosis not present

## 2017-08-26 DIAGNOSIS — G4733 Obstructive sleep apnea (adult) (pediatric): Secondary | ICD-10-CM | POA: Diagnosis not present

## 2017-08-26 DIAGNOSIS — I10 Essential (primary) hypertension: Secondary | ICD-10-CM | POA: Diagnosis not present

## 2017-08-26 DIAGNOSIS — E1140 Type 2 diabetes mellitus with diabetic neuropathy, unspecified: Secondary | ICD-10-CM | POA: Diagnosis not present

## 2017-08-26 DIAGNOSIS — M6281 Muscle weakness (generalized): Secondary | ICD-10-CM | POA: Diagnosis not present

## 2017-08-26 DIAGNOSIS — G43709 Chronic migraine without aura, not intractable, without status migrainosus: Secondary | ICD-10-CM | POA: Diagnosis not present

## 2017-08-26 DIAGNOSIS — M545 Low back pain: Secondary | ICD-10-CM | POA: Diagnosis not present

## 2017-08-27 ENCOUNTER — Other Ambulatory Visit (HOSPITAL_BASED_OUTPATIENT_CLINIC_OR_DEPARTMENT_OTHER): Payer: Self-pay | Admitting: Internal Medicine

## 2017-08-27 DIAGNOSIS — M6281 Muscle weakness (generalized): Secondary | ICD-10-CM | POA: Diagnosis not present

## 2017-08-27 DIAGNOSIS — E1140 Type 2 diabetes mellitus with diabetic neuropathy, unspecified: Secondary | ICD-10-CM | POA: Diagnosis not present

## 2017-08-27 DIAGNOSIS — Z1231 Encounter for screening mammogram for malignant neoplasm of breast: Secondary | ICD-10-CM

## 2017-08-27 DIAGNOSIS — G43709 Chronic migraine without aura, not intractable, without status migrainosus: Secondary | ICD-10-CM | POA: Diagnosis not present

## 2017-08-27 DIAGNOSIS — I10 Essential (primary) hypertension: Secondary | ICD-10-CM | POA: Diagnosis not present

## 2017-08-27 DIAGNOSIS — M545 Low back pain: Secondary | ICD-10-CM | POA: Diagnosis not present

## 2017-08-27 DIAGNOSIS — G40409 Other generalized epilepsy and epileptic syndromes, not intractable, without status epilepticus: Secondary | ICD-10-CM | POA: Diagnosis not present

## 2017-08-27 DIAGNOSIS — G4733 Obstructive sleep apnea (adult) (pediatric): Secondary | ICD-10-CM | POA: Diagnosis not present

## 2017-08-27 DIAGNOSIS — G8929 Other chronic pain: Secondary | ICD-10-CM | POA: Diagnosis not present

## 2017-09-03 DIAGNOSIS — Z83511 Family history of glaucoma: Secondary | ICD-10-CM | POA: Diagnosis not present

## 2017-09-03 DIAGNOSIS — H04123 Dry eye syndrome of bilateral lacrimal glands: Secondary | ICD-10-CM | POA: Diagnosis not present

## 2017-09-03 DIAGNOSIS — H52203 Unspecified astigmatism, bilateral: Secondary | ICD-10-CM | POA: Diagnosis not present

## 2017-09-03 DIAGNOSIS — Z961 Presence of intraocular lens: Secondary | ICD-10-CM | POA: Diagnosis not present

## 2017-09-03 DIAGNOSIS — E113293 Type 2 diabetes mellitus with mild nonproliferative diabetic retinopathy without macular edema, bilateral: Secondary | ICD-10-CM | POA: Diagnosis not present

## 2017-09-03 DIAGNOSIS — H5213 Myopia, bilateral: Secondary | ICD-10-CM | POA: Diagnosis not present

## 2017-09-03 DIAGNOSIS — H524 Presbyopia: Secondary | ICD-10-CM | POA: Diagnosis not present

## 2017-09-03 DIAGNOSIS — Z794 Long term (current) use of insulin: Secondary | ICD-10-CM | POA: Diagnosis not present

## 2017-09-03 DIAGNOSIS — H401134 Primary open-angle glaucoma, bilateral, indeterminate stage: Secondary | ICD-10-CM | POA: Diagnosis not present

## 2017-09-05 DIAGNOSIS — M545 Low back pain: Secondary | ICD-10-CM | POA: Diagnosis not present

## 2017-09-05 DIAGNOSIS — E1140 Type 2 diabetes mellitus with diabetic neuropathy, unspecified: Secondary | ICD-10-CM | POA: Diagnosis not present

## 2017-09-05 DIAGNOSIS — Z0000 Encounter for general adult medical examination without abnormal findings: Secondary | ICD-10-CM | POA: Diagnosis not present

## 2017-09-05 DIAGNOSIS — E119 Type 2 diabetes mellitus without complications: Secondary | ICD-10-CM | POA: Diagnosis not present

## 2017-09-05 DIAGNOSIS — Z01118 Encounter for examination of ears and hearing with other abnormal findings: Secondary | ICD-10-CM | POA: Diagnosis not present

## 2017-09-05 DIAGNOSIS — I10 Essential (primary) hypertension: Secondary | ICD-10-CM | POA: Diagnosis not present

## 2017-09-05 DIAGNOSIS — Z113 Encounter for screening for infections with a predominantly sexual mode of transmission: Secondary | ICD-10-CM | POA: Diagnosis not present

## 2017-09-05 DIAGNOSIS — Z5181 Encounter for therapeutic drug level monitoring: Secondary | ICD-10-CM | POA: Diagnosis not present

## 2017-09-05 DIAGNOSIS — E669 Obesity, unspecified: Secondary | ICD-10-CM | POA: Diagnosis not present

## 2017-09-05 DIAGNOSIS — J45909 Unspecified asthma, uncomplicated: Secondary | ICD-10-CM | POA: Diagnosis not present

## 2017-09-05 DIAGNOSIS — G43909 Migraine, unspecified, not intractable, without status migrainosus: Secondary | ICD-10-CM | POA: Diagnosis not present

## 2017-09-11 ENCOUNTER — Ambulatory Visit (HOSPITAL_BASED_OUTPATIENT_CLINIC_OR_DEPARTMENT_OTHER)
Admission: RE | Admit: 2017-09-11 | Discharge: 2017-09-11 | Disposition: A | Discharge status: Home / Self Care | Payer: Medicare HMO | Source: Ambulatory Visit | Attending: Internal Medicine | Admitting: Internal Medicine

## 2017-09-11 DIAGNOSIS — Z1231 Encounter for screening mammogram for malignant neoplasm of breast: Secondary | ICD-10-CM | POA: Insufficient documentation

## 2017-09-18 DIAGNOSIS — M545 Low back pain: Secondary | ICD-10-CM | POA: Diagnosis not present

## 2017-09-18 DIAGNOSIS — E118 Type 2 diabetes mellitus with unspecified complications: Secondary | ICD-10-CM | POA: Diagnosis not present

## 2017-09-18 DIAGNOSIS — G40909 Epilepsy, unspecified, not intractable, without status epilepticus: Secondary | ICD-10-CM | POA: Diagnosis not present

## 2017-09-18 DIAGNOSIS — R079 Chest pain, unspecified: Secondary | ICD-10-CM | POA: Diagnosis not present

## 2017-09-18 DIAGNOSIS — R1030 Lower abdominal pain, unspecified: Secondary | ICD-10-CM | POA: Diagnosis not present

## 2017-10-08 DIAGNOSIS — I119 Hypertensive heart disease without heart failure: Secondary | ICD-10-CM | POA: Diagnosis not present

## 2017-10-08 DIAGNOSIS — G4733 Obstructive sleep apnea (adult) (pediatric): Secondary | ICD-10-CM | POA: Diagnosis not present

## 2017-10-13 DIAGNOSIS — J45909 Unspecified asthma, uncomplicated: Secondary | ICD-10-CM | POA: Diagnosis not present

## 2017-10-13 DIAGNOSIS — Z0000 Encounter for general adult medical examination without abnormal findings: Secondary | ICD-10-CM | POA: Diagnosis not present

## 2017-10-13 DIAGNOSIS — E669 Obesity, unspecified: Secondary | ICD-10-CM | POA: Diagnosis not present

## 2017-10-13 DIAGNOSIS — I10 Essential (primary) hypertension: Secondary | ICD-10-CM | POA: Diagnosis not present

## 2017-10-13 DIAGNOSIS — E1140 Type 2 diabetes mellitus with diabetic neuropathy, unspecified: Secondary | ICD-10-CM | POA: Diagnosis not present

## 2017-10-13 DIAGNOSIS — E119 Type 2 diabetes mellitus without complications: Secondary | ICD-10-CM | POA: Diagnosis not present

## 2017-10-13 DIAGNOSIS — R569 Unspecified convulsions: Secondary | ICD-10-CM | POA: Diagnosis not present

## 2017-10-13 DIAGNOSIS — G43909 Migraine, unspecified, not intractable, without status migrainosus: Secondary | ICD-10-CM | POA: Diagnosis not present

## 2017-10-13 DIAGNOSIS — M545 Low back pain: Secondary | ICD-10-CM | POA: Diagnosis not present

## 2017-10-18 DIAGNOSIS — R1030 Lower abdominal pain, unspecified: Secondary | ICD-10-CM | POA: Diagnosis not present

## 2017-10-18 DIAGNOSIS — M545 Low back pain: Secondary | ICD-10-CM | POA: Diagnosis not present

## 2017-10-18 DIAGNOSIS — E118 Type 2 diabetes mellitus with unspecified complications: Secondary | ICD-10-CM | POA: Diagnosis not present

## 2017-10-18 DIAGNOSIS — G40909 Epilepsy, unspecified, not intractable, without status epilepticus: Secondary | ICD-10-CM | POA: Diagnosis not present

## 2017-10-18 DIAGNOSIS — R079 Chest pain, unspecified: Secondary | ICD-10-CM | POA: Diagnosis not present

## 2017-10-21 DIAGNOSIS — G43909 Migraine, unspecified, not intractable, without status migrainosus: Secondary | ICD-10-CM | POA: Diagnosis not present

## 2017-10-21 DIAGNOSIS — Z1389 Encounter for screening for other disorder: Secondary | ICD-10-CM | POA: Diagnosis not present

## 2017-10-21 DIAGNOSIS — J45909 Unspecified asthma, uncomplicated: Secondary | ICD-10-CM | POA: Diagnosis not present

## 2017-10-21 DIAGNOSIS — E669 Obesity, unspecified: Secondary | ICD-10-CM | POA: Diagnosis not present

## 2017-10-21 DIAGNOSIS — E119 Type 2 diabetes mellitus without complications: Secondary | ICD-10-CM | POA: Diagnosis not present

## 2017-10-21 DIAGNOSIS — N760 Acute vaginitis: Secondary | ICD-10-CM | POA: Diagnosis not present

## 2017-10-21 DIAGNOSIS — I10 Essential (primary) hypertension: Secondary | ICD-10-CM | POA: Diagnosis not present

## 2017-10-21 DIAGNOSIS — E1140 Type 2 diabetes mellitus with diabetic neuropathy, unspecified: Secondary | ICD-10-CM | POA: Diagnosis not present

## 2017-10-21 DIAGNOSIS — R569 Unspecified convulsions: Secondary | ICD-10-CM | POA: Diagnosis not present

## 2017-10-26 DIAGNOSIS — N898 Other specified noninflammatory disorders of vagina: Secondary | ICD-10-CM | POA: Diagnosis not present

## 2017-10-26 DIAGNOSIS — E1142 Type 2 diabetes mellitus with diabetic polyneuropathy: Secondary | ICD-10-CM | POA: Diagnosis not present

## 2017-10-26 DIAGNOSIS — I10 Essential (primary) hypertension: Secondary | ICD-10-CM | POA: Diagnosis not present

## 2017-10-26 DIAGNOSIS — Z794 Long term (current) use of insulin: Secondary | ICD-10-CM | POA: Diagnosis not present

## 2017-10-26 DIAGNOSIS — N950 Postmenopausal bleeding: Secondary | ICD-10-CM | POA: Diagnosis not present

## 2017-10-26 DIAGNOSIS — E119 Type 2 diabetes mellitus without complications: Secondary | ICD-10-CM | POA: Diagnosis not present

## 2017-10-26 DIAGNOSIS — N938 Other specified abnormal uterine and vaginal bleeding: Secondary | ICD-10-CM | POA: Diagnosis not present

## 2017-10-27 DIAGNOSIS — R52 Pain, unspecified: Secondary | ICD-10-CM | POA: Diagnosis not present

## 2017-10-27 DIAGNOSIS — I10 Essential (primary) hypertension: Secondary | ICD-10-CM | POA: Diagnosis not present

## 2017-10-27 DIAGNOSIS — R569 Unspecified convulsions: Secondary | ICD-10-CM | POA: Diagnosis not present

## 2017-10-27 DIAGNOSIS — M545 Low back pain: Secondary | ICD-10-CM | POA: Diagnosis not present

## 2017-10-27 DIAGNOSIS — E119 Type 2 diabetes mellitus without complications: Secondary | ICD-10-CM | POA: Diagnosis not present

## 2017-10-27 DIAGNOSIS — G43909 Migraine, unspecified, not intractable, without status migrainosus: Secondary | ICD-10-CM | POA: Diagnosis not present

## 2017-10-27 DIAGNOSIS — E669 Obesity, unspecified: Secondary | ICD-10-CM | POA: Diagnosis not present

## 2017-10-27 DIAGNOSIS — J45909 Unspecified asthma, uncomplicated: Secondary | ICD-10-CM | POA: Diagnosis not present

## 2017-10-27 DIAGNOSIS — M791 Myalgia, unspecified site: Secondary | ICD-10-CM | POA: Diagnosis not present

## 2017-10-31 DIAGNOSIS — Z113 Encounter for screening for infections with a predominantly sexual mode of transmission: Secondary | ICD-10-CM | POA: Diagnosis not present

## 2017-10-31 DIAGNOSIS — N939 Abnormal uterine and vaginal bleeding, unspecified: Secondary | ICD-10-CM | POA: Diagnosis not present

## 2017-11-04 DIAGNOSIS — J45909 Unspecified asthma, uncomplicated: Secondary | ICD-10-CM | POA: Diagnosis not present

## 2017-11-04 DIAGNOSIS — I10 Essential (primary) hypertension: Secondary | ICD-10-CM | POA: Diagnosis not present

## 2017-11-04 DIAGNOSIS — G43909 Migraine, unspecified, not intractable, without status migrainosus: Secondary | ICD-10-CM | POA: Diagnosis not present

## 2017-11-04 DIAGNOSIS — M545 Low back pain: Secondary | ICD-10-CM | POA: Diagnosis not present

## 2017-11-04 DIAGNOSIS — R569 Unspecified convulsions: Secondary | ICD-10-CM | POA: Diagnosis not present

## 2017-11-04 DIAGNOSIS — E119 Type 2 diabetes mellitus without complications: Secondary | ICD-10-CM | POA: Diagnosis not present

## 2017-11-04 DIAGNOSIS — E669 Obesity, unspecified: Secondary | ICD-10-CM | POA: Diagnosis not present

## 2017-11-04 DIAGNOSIS — R52 Pain, unspecified: Secondary | ICD-10-CM | POA: Diagnosis not present

## 2017-11-04 DIAGNOSIS — E1140 Type 2 diabetes mellitus with diabetic neuropathy, unspecified: Secondary | ICD-10-CM | POA: Diagnosis not present

## 2017-11-05 DIAGNOSIS — I10 Essential (primary) hypertension: Secondary | ICD-10-CM | POA: Diagnosis not present

## 2017-11-05 DIAGNOSIS — E119 Type 2 diabetes mellitus without complications: Secondary | ICD-10-CM | POA: Diagnosis not present

## 2017-11-10 DIAGNOSIS — G43019 Migraine without aura, intractable, without status migrainosus: Secondary | ICD-10-CM | POA: Diagnosis not present

## 2017-11-10 DIAGNOSIS — R569 Unspecified convulsions: Secondary | ICD-10-CM | POA: Diagnosis not present

## 2017-11-18 DIAGNOSIS — R079 Chest pain, unspecified: Secondary | ICD-10-CM | POA: Diagnosis not present

## 2017-11-18 DIAGNOSIS — G40909 Epilepsy, unspecified, not intractable, without status epilepticus: Secondary | ICD-10-CM | POA: Diagnosis not present

## 2017-11-18 DIAGNOSIS — M545 Low back pain: Secondary | ICD-10-CM | POA: Diagnosis not present

## 2017-11-18 DIAGNOSIS — E118 Type 2 diabetes mellitus with unspecified complications: Secondary | ICD-10-CM | POA: Diagnosis not present

## 2017-11-18 DIAGNOSIS — R1030 Lower abdominal pain, unspecified: Secondary | ICD-10-CM | POA: Diagnosis not present

## 2017-12-02 DIAGNOSIS — H9209 Otalgia, unspecified ear: Secondary | ICD-10-CM | POA: Diagnosis not present

## 2017-12-02 DIAGNOSIS — I10 Essential (primary) hypertension: Secondary | ICD-10-CM | POA: Diagnosis not present

## 2017-12-02 DIAGNOSIS — Z794 Long term (current) use of insulin: Secondary | ICD-10-CM | POA: Diagnosis not present

## 2017-12-02 DIAGNOSIS — E119 Type 2 diabetes mellitus without complications: Secondary | ICD-10-CM | POA: Diagnosis not present

## 2017-12-08 DIAGNOSIS — R569 Unspecified convulsions: Secondary | ICD-10-CM | POA: Diagnosis not present

## 2017-12-08 DIAGNOSIS — J45909 Unspecified asthma, uncomplicated: Secondary | ICD-10-CM | POA: Diagnosis not present

## 2017-12-08 DIAGNOSIS — G43909 Migraine, unspecified, not intractable, without status migrainosus: Secondary | ICD-10-CM | POA: Diagnosis not present

## 2017-12-08 DIAGNOSIS — E669 Obesity, unspecified: Secondary | ICD-10-CM | POA: Diagnosis not present

## 2017-12-08 DIAGNOSIS — M545 Low back pain: Secondary | ICD-10-CM | POA: Diagnosis not present

## 2017-12-08 DIAGNOSIS — I10 Essential (primary) hypertension: Secondary | ICD-10-CM | POA: Diagnosis not present

## 2017-12-08 DIAGNOSIS — E119 Type 2 diabetes mellitus without complications: Secondary | ICD-10-CM | POA: Diagnosis not present

## 2017-12-08 DIAGNOSIS — E1140 Type 2 diabetes mellitus with diabetic neuropathy, unspecified: Secondary | ICD-10-CM | POA: Diagnosis not present

## 2017-12-18 DIAGNOSIS — M545 Low back pain: Secondary | ICD-10-CM | POA: Diagnosis not present

## 2017-12-18 DIAGNOSIS — G40909 Epilepsy, unspecified, not intractable, without status epilepticus: Secondary | ICD-10-CM | POA: Diagnosis not present

## 2017-12-18 DIAGNOSIS — R1030 Lower abdominal pain, unspecified: Secondary | ICD-10-CM | POA: Diagnosis not present

## 2017-12-18 DIAGNOSIS — E118 Type 2 diabetes mellitus with unspecified complications: Secondary | ICD-10-CM | POA: Diagnosis not present

## 2017-12-18 DIAGNOSIS — R079 Chest pain, unspecified: Secondary | ICD-10-CM | POA: Diagnosis not present

## 2018-01-08 DIAGNOSIS — G43019 Migraine without aura, intractable, without status migrainosus: Secondary | ICD-10-CM | POA: Diagnosis not present

## 2018-01-08 DIAGNOSIS — R569 Unspecified convulsions: Secondary | ICD-10-CM | POA: Diagnosis not present

## 2018-01-16 DIAGNOSIS — R569 Unspecified convulsions: Secondary | ICD-10-CM | POA: Diagnosis not present

## 2018-01-16 DIAGNOSIS — E669 Obesity, unspecified: Secondary | ICD-10-CM | POA: Diagnosis not present

## 2018-01-16 DIAGNOSIS — G43909 Migraine, unspecified, not intractable, without status migrainosus: Secondary | ICD-10-CM | POA: Diagnosis not present

## 2018-01-16 DIAGNOSIS — M545 Low back pain: Secondary | ICD-10-CM | POA: Diagnosis not present

## 2018-01-16 DIAGNOSIS — J45909 Unspecified asthma, uncomplicated: Secondary | ICD-10-CM | POA: Diagnosis not present

## 2018-01-16 DIAGNOSIS — E119 Type 2 diabetes mellitus without complications: Secondary | ICD-10-CM | POA: Diagnosis not present

## 2018-01-16 DIAGNOSIS — I10 Essential (primary) hypertension: Secondary | ICD-10-CM | POA: Diagnosis not present

## 2018-01-16 DIAGNOSIS — E1140 Type 2 diabetes mellitus with diabetic neuropathy, unspecified: Secondary | ICD-10-CM | POA: Diagnosis not present

## 2018-01-18 DIAGNOSIS — G40909 Epilepsy, unspecified, not intractable, without status epilepticus: Secondary | ICD-10-CM | POA: Diagnosis not present

## 2018-01-18 DIAGNOSIS — R079 Chest pain, unspecified: Secondary | ICD-10-CM | POA: Diagnosis not present

## 2018-01-18 DIAGNOSIS — M545 Low back pain: Secondary | ICD-10-CM | POA: Diagnosis not present

## 2018-01-18 DIAGNOSIS — R1030 Lower abdominal pain, unspecified: Secondary | ICD-10-CM | POA: Diagnosis not present

## 2018-01-18 DIAGNOSIS — E118 Type 2 diabetes mellitus with unspecified complications: Secondary | ICD-10-CM | POA: Diagnosis not present

## 2018-01-21 DIAGNOSIS — H401134 Primary open-angle glaucoma, bilateral, indeterminate stage: Secondary | ICD-10-CM | POA: Diagnosis not present

## 2018-01-21 DIAGNOSIS — H5203 Hypermetropia, bilateral: Secondary | ICD-10-CM | POA: Diagnosis not present

## 2018-01-21 DIAGNOSIS — H524 Presbyopia: Secondary | ICD-10-CM | POA: Diagnosis not present

## 2018-01-21 DIAGNOSIS — H52203 Unspecified astigmatism, bilateral: Secondary | ICD-10-CM | POA: Diagnosis not present

## 2018-01-26 DIAGNOSIS — J4520 Mild intermittent asthma, uncomplicated: Secondary | ICD-10-CM | POA: Diagnosis not present

## 2018-01-26 DIAGNOSIS — E1140 Type 2 diabetes mellitus with diabetic neuropathy, unspecified: Secondary | ICD-10-CM | POA: Diagnosis not present

## 2018-01-26 DIAGNOSIS — M545 Low back pain: Secondary | ICD-10-CM | POA: Diagnosis not present

## 2018-01-26 DIAGNOSIS — G43909 Migraine, unspecified, not intractable, without status migrainosus: Secondary | ICD-10-CM | POA: Diagnosis not present

## 2018-01-26 DIAGNOSIS — E669 Obesity, unspecified: Secondary | ICD-10-CM | POA: Diagnosis not present

## 2018-01-26 DIAGNOSIS — I10 Essential (primary) hypertension: Secondary | ICD-10-CM | POA: Diagnosis not present

## 2018-01-26 DIAGNOSIS — E119 Type 2 diabetes mellitus without complications: Secondary | ICD-10-CM | POA: Diagnosis not present

## 2018-01-26 DIAGNOSIS — R569 Unspecified convulsions: Secondary | ICD-10-CM | POA: Diagnosis not present

## 2018-02-18 DIAGNOSIS — E118 Type 2 diabetes mellitus with unspecified complications: Secondary | ICD-10-CM | POA: Diagnosis not present

## 2018-02-18 DIAGNOSIS — R079 Chest pain, unspecified: Secondary | ICD-10-CM | POA: Diagnosis not present

## 2018-02-18 DIAGNOSIS — G40909 Epilepsy, unspecified, not intractable, without status epilepticus: Secondary | ICD-10-CM | POA: Diagnosis not present

## 2018-02-18 DIAGNOSIS — R109 Unspecified abdominal pain: Secondary | ICD-10-CM | POA: Diagnosis not present

## 2018-02-18 DIAGNOSIS — R634 Abnormal weight loss: Secondary | ICD-10-CM | POA: Diagnosis not present

## 2018-02-18 DIAGNOSIS — R1030 Lower abdominal pain, unspecified: Secondary | ICD-10-CM | POA: Diagnosis not present

## 2018-02-18 DIAGNOSIS — M545 Low back pain: Secondary | ICD-10-CM | POA: Diagnosis not present

## 2018-02-19 DIAGNOSIS — J4520 Mild intermittent asthma, uncomplicated: Secondary | ICD-10-CM | POA: Diagnosis not present

## 2018-02-19 DIAGNOSIS — Z23 Encounter for immunization: Secondary | ICD-10-CM | POA: Diagnosis not present

## 2018-02-19 DIAGNOSIS — R569 Unspecified convulsions: Secondary | ICD-10-CM | POA: Diagnosis not present

## 2018-02-20 DIAGNOSIS — H401134 Primary open-angle glaucoma, bilateral, indeterminate stage: Secondary | ICD-10-CM | POA: Diagnosis not present

## 2018-02-27 DIAGNOSIS — Z1211 Encounter for screening for malignant neoplasm of colon: Secondary | ICD-10-CM | POA: Diagnosis not present

## 2018-02-27 DIAGNOSIS — K649 Unspecified hemorrhoids: Secondary | ICD-10-CM | POA: Diagnosis not present

## 2018-02-27 DIAGNOSIS — R1084 Generalized abdominal pain: Secondary | ICD-10-CM | POA: Diagnosis not present

## 2018-02-27 DIAGNOSIS — K639 Disease of intestine, unspecified: Secondary | ICD-10-CM | POA: Diagnosis not present

## 2018-02-27 DIAGNOSIS — K449 Diaphragmatic hernia without obstruction or gangrene: Secondary | ICD-10-CM | POA: Diagnosis not present

## 2018-02-27 DIAGNOSIS — R1013 Epigastric pain: Secondary | ICD-10-CM | POA: Diagnosis not present

## 2018-02-27 DIAGNOSIS — K648 Other hemorrhoids: Secondary | ICD-10-CM | POA: Diagnosis not present

## 2018-02-27 DIAGNOSIS — R634 Abnormal weight loss: Secondary | ICD-10-CM | POA: Diagnosis not present

## 2018-03-11 DIAGNOSIS — R634 Abnormal weight loss: Secondary | ICD-10-CM | POA: Diagnosis not present

## 2018-03-11 DIAGNOSIS — K5730 Diverticulosis of large intestine without perforation or abscess without bleeding: Secondary | ICD-10-CM | POA: Diagnosis not present

## 2018-03-20 DIAGNOSIS — M545 Low back pain: Secondary | ICD-10-CM | POA: Diagnosis not present

## 2018-03-20 DIAGNOSIS — R079 Chest pain, unspecified: Secondary | ICD-10-CM | POA: Diagnosis not present

## 2018-03-20 DIAGNOSIS — E118 Type 2 diabetes mellitus with unspecified complications: Secondary | ICD-10-CM | POA: Diagnosis not present

## 2018-03-20 DIAGNOSIS — G40909 Epilepsy, unspecified, not intractable, without status epilepticus: Secondary | ICD-10-CM | POA: Diagnosis not present

## 2018-03-20 DIAGNOSIS — R1030 Lower abdominal pain, unspecified: Secondary | ICD-10-CM | POA: Diagnosis not present

## 2018-04-10 DIAGNOSIS — G43019 Migraine without aura, intractable, without status migrainosus: Secondary | ICD-10-CM | POA: Diagnosis not present

## 2018-04-10 DIAGNOSIS — R51 Headache: Secondary | ICD-10-CM | POA: Diagnosis not present

## 2018-04-13 DIAGNOSIS — E119 Type 2 diabetes mellitus without complications: Secondary | ICD-10-CM | POA: Diagnosis not present

## 2018-04-13 DIAGNOSIS — J4520 Mild intermittent asthma, uncomplicated: Secondary | ICD-10-CM | POA: Diagnosis not present

## 2018-04-13 DIAGNOSIS — G43909 Migraine, unspecified, not intractable, without status migrainosus: Secondary | ICD-10-CM | POA: Diagnosis not present

## 2018-04-13 DIAGNOSIS — M545 Low back pain: Secondary | ICD-10-CM | POA: Diagnosis not present

## 2018-04-13 DIAGNOSIS — R569 Unspecified convulsions: Secondary | ICD-10-CM | POA: Diagnosis not present

## 2018-04-13 DIAGNOSIS — E669 Obesity, unspecified: Secondary | ICD-10-CM | POA: Diagnosis not present

## 2018-04-13 DIAGNOSIS — I10 Essential (primary) hypertension: Secondary | ICD-10-CM | POA: Diagnosis not present

## 2018-04-13 DIAGNOSIS — E1140 Type 2 diabetes mellitus with diabetic neuropathy, unspecified: Secondary | ICD-10-CM | POA: Diagnosis not present

## 2018-04-20 DIAGNOSIS — E118 Type 2 diabetes mellitus with unspecified complications: Secondary | ICD-10-CM | POA: Diagnosis not present

## 2018-04-20 DIAGNOSIS — G40909 Epilepsy, unspecified, not intractable, without status epilepticus: Secondary | ICD-10-CM | POA: Diagnosis not present

## 2018-04-20 DIAGNOSIS — R079 Chest pain, unspecified: Secondary | ICD-10-CM | POA: Diagnosis not present

## 2018-04-20 DIAGNOSIS — R1030 Lower abdominal pain, unspecified: Secondary | ICD-10-CM | POA: Diagnosis not present

## 2018-04-20 DIAGNOSIS — M545 Low back pain: Secondary | ICD-10-CM | POA: Diagnosis not present

## 2018-05-18 DIAGNOSIS — G43019 Migraine without aura, intractable, without status migrainosus: Secondary | ICD-10-CM | POA: Diagnosis not present

## 2018-05-18 DIAGNOSIS — R51 Headache: Secondary | ICD-10-CM | POA: Diagnosis not present

## 2018-05-18 DIAGNOSIS — R569 Unspecified convulsions: Secondary | ICD-10-CM | POA: Diagnosis not present

## 2018-05-20 DIAGNOSIS — M545 Low back pain: Secondary | ICD-10-CM | POA: Diagnosis not present

## 2018-05-20 DIAGNOSIS — G40909 Epilepsy, unspecified, not intractable, without status epilepticus: Secondary | ICD-10-CM | POA: Diagnosis not present

## 2018-05-20 DIAGNOSIS — R079 Chest pain, unspecified: Secondary | ICD-10-CM | POA: Diagnosis not present

## 2018-05-20 DIAGNOSIS — R1030 Lower abdominal pain, unspecified: Secondary | ICD-10-CM | POA: Diagnosis not present

## 2018-05-20 DIAGNOSIS — E118 Type 2 diabetes mellitus with unspecified complications: Secondary | ICD-10-CM | POA: Diagnosis not present

## 2018-05-25 DIAGNOSIS — H401134 Primary open-angle glaucoma, bilateral, indeterminate stage: Secondary | ICD-10-CM | POA: Diagnosis not present

## 2018-05-26 DIAGNOSIS — M545 Low back pain: Secondary | ICD-10-CM | POA: Diagnosis not present

## 2018-05-26 DIAGNOSIS — G43909 Migraine, unspecified, not intractable, without status migrainosus: Secondary | ICD-10-CM | POA: Diagnosis not present

## 2018-05-26 DIAGNOSIS — E1140 Type 2 diabetes mellitus with diabetic neuropathy, unspecified: Secondary | ICD-10-CM | POA: Diagnosis not present

## 2018-05-26 DIAGNOSIS — E669 Obesity, unspecified: Secondary | ICD-10-CM | POA: Diagnosis not present

## 2018-05-26 DIAGNOSIS — J4520 Mild intermittent asthma, uncomplicated: Secondary | ICD-10-CM | POA: Diagnosis not present

## 2018-05-26 DIAGNOSIS — R569 Unspecified convulsions: Secondary | ICD-10-CM | POA: Diagnosis not present

## 2018-05-26 DIAGNOSIS — I10 Essential (primary) hypertension: Secondary | ICD-10-CM | POA: Diagnosis not present

## 2018-05-26 DIAGNOSIS — E119 Type 2 diabetes mellitus without complications: Secondary | ICD-10-CM | POA: Diagnosis not present

## 2018-07-01 DIAGNOSIS — E119 Type 2 diabetes mellitus without complications: Secondary | ICD-10-CM | POA: Diagnosis not present

## 2018-07-01 DIAGNOSIS — R05 Cough: Secondary | ICD-10-CM | POA: Diagnosis not present

## 2018-07-01 DIAGNOSIS — J0100 Acute maxillary sinusitis, unspecified: Secondary | ICD-10-CM | POA: Diagnosis not present

## 2018-07-01 DIAGNOSIS — I10 Essential (primary) hypertension: Secondary | ICD-10-CM | POA: Diagnosis not present

## 2018-07-01 DIAGNOSIS — G43909 Migraine, unspecified, not intractable, without status migrainosus: Secondary | ICD-10-CM | POA: Diagnosis not present

## 2018-07-20 DIAGNOSIS — I10 Essential (primary) hypertension: Secondary | ICD-10-CM | POA: Diagnosis not present

## 2018-07-20 DIAGNOSIS — G43909 Migraine, unspecified, not intractable, without status migrainosus: Secondary | ICD-10-CM | POA: Diagnosis not present

## 2018-07-20 DIAGNOSIS — M545 Low back pain: Secondary | ICD-10-CM | POA: Diagnosis not present

## 2018-07-20 DIAGNOSIS — R569 Unspecified convulsions: Secondary | ICD-10-CM | POA: Diagnosis not present

## 2018-07-20 DIAGNOSIS — E1140 Type 2 diabetes mellitus with diabetic neuropathy, unspecified: Secondary | ICD-10-CM | POA: Diagnosis not present

## 2018-07-27 DIAGNOSIS — M2011 Hallux valgus (acquired), right foot: Secondary | ICD-10-CM | POA: Diagnosis not present

## 2018-07-27 DIAGNOSIS — R2681 Unsteadiness on feet: Secondary | ICD-10-CM | POA: Diagnosis not present

## 2018-07-27 DIAGNOSIS — Z9181 History of falling: Secondary | ICD-10-CM | POA: Diagnosis not present

## 2018-07-27 DIAGNOSIS — E119 Type 2 diabetes mellitus without complications: Secondary | ICD-10-CM | POA: Diagnosis not present

## 2018-07-27 DIAGNOSIS — M2012 Hallux valgus (acquired), left foot: Secondary | ICD-10-CM | POA: Diagnosis not present

## 2018-07-30 DIAGNOSIS — R2681 Unsteadiness on feet: Secondary | ICD-10-CM | POA: Diagnosis not present

## 2018-07-30 DIAGNOSIS — Z9181 History of falling: Secondary | ICD-10-CM | POA: Diagnosis not present

## 2018-08-03 DIAGNOSIS — E1140 Type 2 diabetes mellitus with diabetic neuropathy, unspecified: Secondary | ICD-10-CM | POA: Diagnosis not present

## 2018-08-03 DIAGNOSIS — I10 Essential (primary) hypertension: Secondary | ICD-10-CM | POA: Diagnosis not present

## 2018-08-04 ENCOUNTER — Other Ambulatory Visit (HOSPITAL_BASED_OUTPATIENT_CLINIC_OR_DEPARTMENT_OTHER): Payer: Self-pay | Admitting: Internal Medicine

## 2018-08-04 DIAGNOSIS — Z1231 Encounter for screening mammogram for malignant neoplasm of breast: Secondary | ICD-10-CM

## 2018-08-04 DIAGNOSIS — Z9181 History of falling: Secondary | ICD-10-CM | POA: Diagnosis not present

## 2018-08-04 DIAGNOSIS — R2681 Unsteadiness on feet: Secondary | ICD-10-CM | POA: Diagnosis not present

## 2018-08-07 DIAGNOSIS — R2681 Unsteadiness on feet: Secondary | ICD-10-CM | POA: Diagnosis not present

## 2018-08-07 DIAGNOSIS — Z9181 History of falling: Secondary | ICD-10-CM | POA: Diagnosis not present

## 2018-08-11 DIAGNOSIS — Z9181 History of falling: Secondary | ICD-10-CM | POA: Diagnosis not present

## 2018-08-11 DIAGNOSIS — R2681 Unsteadiness on feet: Secondary | ICD-10-CM | POA: Diagnosis not present

## 2018-08-14 DIAGNOSIS — R2681 Unsteadiness on feet: Secondary | ICD-10-CM | POA: Diagnosis not present

## 2018-08-14 DIAGNOSIS — Z9181 History of falling: Secondary | ICD-10-CM | POA: Diagnosis not present

## 2018-08-18 DIAGNOSIS — R2681 Unsteadiness on feet: Secondary | ICD-10-CM | POA: Diagnosis not present

## 2018-08-18 DIAGNOSIS — Z9181 History of falling: Secondary | ICD-10-CM | POA: Diagnosis not present

## 2018-08-21 DIAGNOSIS — Z9181 History of falling: Secondary | ICD-10-CM | POA: Diagnosis not present

## 2018-08-21 DIAGNOSIS — R2681 Unsteadiness on feet: Secondary | ICD-10-CM | POA: Diagnosis not present

## 2018-08-25 DIAGNOSIS — R2681 Unsteadiness on feet: Secondary | ICD-10-CM | POA: Diagnosis not present

## 2018-08-25 DIAGNOSIS — Z9181 History of falling: Secondary | ICD-10-CM | POA: Diagnosis not present

## 2018-08-28 DIAGNOSIS — R2681 Unsteadiness on feet: Secondary | ICD-10-CM | POA: Diagnosis not present

## 2018-08-28 DIAGNOSIS — Z9181 History of falling: Secondary | ICD-10-CM | POA: Diagnosis not present

## 2018-09-01 DIAGNOSIS — Z9181 History of falling: Secondary | ICD-10-CM | POA: Diagnosis not present

## 2018-09-01 DIAGNOSIS — R2681 Unsteadiness on feet: Secondary | ICD-10-CM | POA: Diagnosis not present

## 2018-09-07 DIAGNOSIS — Z131 Encounter for screening for diabetes mellitus: Secondary | ICD-10-CM | POA: Diagnosis not present

## 2018-09-07 DIAGNOSIS — Z5181 Encounter for therapeutic drug level monitoring: Secondary | ICD-10-CM | POA: Diagnosis not present

## 2018-09-07 DIAGNOSIS — Z0001 Encounter for general adult medical examination with abnormal findings: Secondary | ICD-10-CM | POA: Diagnosis not present

## 2018-09-07 DIAGNOSIS — Z01021 Encounter for examination of eyes and vision following failed vision screening with abnormal findings: Secondary | ICD-10-CM | POA: Diagnosis not present

## 2018-09-07 DIAGNOSIS — Z1389 Encounter for screening for other disorder: Secondary | ICD-10-CM | POA: Diagnosis not present

## 2018-09-07 DIAGNOSIS — Z01118 Encounter for examination of ears and hearing with other abnormal findings: Secondary | ICD-10-CM | POA: Diagnosis not present

## 2018-09-07 DIAGNOSIS — Z1329 Encounter for screening for other suspected endocrine disorder: Secondary | ICD-10-CM | POA: Diagnosis not present

## 2018-09-07 DIAGNOSIS — Z136 Encounter for screening for cardiovascular disorders: Secondary | ICD-10-CM | POA: Diagnosis not present

## 2018-09-07 DIAGNOSIS — I10 Essential (primary) hypertension: Secondary | ICD-10-CM | POA: Diagnosis not present

## 2018-09-11 DIAGNOSIS — Z9181 History of falling: Secondary | ICD-10-CM | POA: Diagnosis not present

## 2018-09-11 DIAGNOSIS — R2681 Unsteadiness on feet: Secondary | ICD-10-CM | POA: Diagnosis not present

## 2018-09-16 ENCOUNTER — Ambulatory Visit (HOSPITAL_BASED_OUTPATIENT_CLINIC_OR_DEPARTMENT_OTHER): Payer: Medicare HMO

## 2018-09-24 DIAGNOSIS — R2681 Unsteadiness on feet: Secondary | ICD-10-CM | POA: Diagnosis not present

## 2018-09-24 DIAGNOSIS — Z9181 History of falling: Secondary | ICD-10-CM | POA: Diagnosis not present

## 2018-10-02 DIAGNOSIS — Z9181 History of falling: Secondary | ICD-10-CM | POA: Diagnosis not present

## 2018-10-02 DIAGNOSIS — R2681 Unsteadiness on feet: Secondary | ICD-10-CM | POA: Diagnosis not present

## 2018-10-07 DIAGNOSIS — H52203 Unspecified astigmatism, bilateral: Secondary | ICD-10-CM | POA: Diagnosis not present

## 2018-10-07 DIAGNOSIS — H401134 Primary open-angle glaucoma, bilateral, indeterminate stage: Secondary | ICD-10-CM | POA: Diagnosis not present

## 2018-10-07 DIAGNOSIS — E113292 Type 2 diabetes mellitus with mild nonproliferative diabetic retinopathy without macular edema, left eye: Secondary | ICD-10-CM | POA: Diagnosis not present

## 2018-10-07 DIAGNOSIS — E113211 Type 2 diabetes mellitus with mild nonproliferative diabetic retinopathy with macular edema, right eye: Secondary | ICD-10-CM | POA: Diagnosis not present

## 2018-10-07 DIAGNOSIS — H524 Presbyopia: Secondary | ICD-10-CM | POA: Diagnosis not present

## 2018-10-13 DIAGNOSIS — G4733 Obstructive sleep apnea (adult) (pediatric): Secondary | ICD-10-CM | POA: Diagnosis not present

## 2018-10-13 DIAGNOSIS — R569 Unspecified convulsions: Secondary | ICD-10-CM | POA: Diagnosis not present

## 2018-10-13 DIAGNOSIS — G43709 Chronic migraine without aura, not intractable, without status migrainosus: Secondary | ICD-10-CM | POA: Diagnosis not present

## 2018-10-19 DIAGNOSIS — N179 Acute kidney failure, unspecified: Secondary | ICD-10-CM | POA: Diagnosis not present

## 2018-10-19 DIAGNOSIS — I10 Essential (primary) hypertension: Secondary | ICD-10-CM | POA: Diagnosis not present

## 2018-10-19 DIAGNOSIS — E119 Type 2 diabetes mellitus without complications: Secondary | ICD-10-CM | POA: Diagnosis not present

## 2018-10-19 DIAGNOSIS — E1140 Type 2 diabetes mellitus with diabetic neuropathy, unspecified: Secondary | ICD-10-CM | POA: Diagnosis not present

## 2018-10-19 DIAGNOSIS — Z0001 Encounter for general adult medical examination with abnormal findings: Secondary | ICD-10-CM | POA: Diagnosis not present

## 2018-10-21 DIAGNOSIS — G4733 Obstructive sleep apnea (adult) (pediatric): Secondary | ICD-10-CM | POA: Diagnosis not present

## 2018-11-16 ENCOUNTER — Encounter (HOSPITAL_BASED_OUTPATIENT_CLINIC_OR_DEPARTMENT_OTHER): Payer: Self-pay

## 2018-11-16 ENCOUNTER — Other Ambulatory Visit: Payer: Self-pay

## 2018-11-16 ENCOUNTER — Ambulatory Visit (HOSPITAL_BASED_OUTPATIENT_CLINIC_OR_DEPARTMENT_OTHER)
Admission: RE | Admit: 2018-11-16 | Discharge: 2018-11-16 | Disposition: A | Discharge status: Home / Self Care | Payer: Medicare Other | Source: Ambulatory Visit | Attending: Internal Medicine | Admitting: Internal Medicine

## 2018-11-16 DIAGNOSIS — Z1231 Encounter for screening mammogram for malignant neoplasm of breast: Secondary | ICD-10-CM | POA: Diagnosis not present

## 2018-11-18 DIAGNOSIS — J4520 Mild intermittent asthma, uncomplicated: Secondary | ICD-10-CM | POA: Diagnosis not present

## 2018-11-18 DIAGNOSIS — N179 Acute kidney failure, unspecified: Secondary | ICD-10-CM | POA: Diagnosis not present

## 2018-11-18 DIAGNOSIS — M545 Low back pain: Secondary | ICD-10-CM | POA: Diagnosis not present

## 2018-11-18 DIAGNOSIS — E119 Type 2 diabetes mellitus without complications: Secondary | ICD-10-CM | POA: Diagnosis not present

## 2018-11-18 DIAGNOSIS — I10 Essential (primary) hypertension: Secondary | ICD-10-CM | POA: Diagnosis not present

## 2018-11-18 DIAGNOSIS — E1140 Type 2 diabetes mellitus with diabetic neuropathy, unspecified: Secondary | ICD-10-CM | POA: Diagnosis not present

## 2018-11-24 DIAGNOSIS — R569 Unspecified convulsions: Secondary | ICD-10-CM | POA: Diagnosis not present

## 2018-11-30 DIAGNOSIS — R94120 Abnormal auditory function study: Secondary | ICD-10-CM | POA: Diagnosis not present

## 2018-11-30 DIAGNOSIS — H903 Sensorineural hearing loss, bilateral: Secondary | ICD-10-CM | POA: Diagnosis not present

## 2018-12-03 DIAGNOSIS — E119 Type 2 diabetes mellitus without complications: Secondary | ICD-10-CM | POA: Diagnosis not present

## 2018-12-03 DIAGNOSIS — H903 Sensorineural hearing loss, bilateral: Secondary | ICD-10-CM | POA: Diagnosis not present

## 2018-12-03 DIAGNOSIS — G43709 Chronic migraine without aura, not intractable, without status migrainosus: Secondary | ICD-10-CM | POA: Diagnosis not present

## 2018-12-03 DIAGNOSIS — I10 Essential (primary) hypertension: Secondary | ICD-10-CM | POA: Diagnosis not present

## 2018-12-03 DIAGNOSIS — E1140 Type 2 diabetes mellitus with diabetic neuropathy, unspecified: Secondary | ICD-10-CM | POA: Diagnosis not present

## 2018-12-21 DIAGNOSIS — E1140 Type 2 diabetes mellitus with diabetic neuropathy, unspecified: Secondary | ICD-10-CM | POA: Diagnosis not present

## 2018-12-21 DIAGNOSIS — R569 Unspecified convulsions: Secondary | ICD-10-CM | POA: Diagnosis not present

## 2018-12-21 DIAGNOSIS — E119 Type 2 diabetes mellitus without complications: Secondary | ICD-10-CM | POA: Diagnosis not present

## 2018-12-21 DIAGNOSIS — J4520 Mild intermittent asthma, uncomplicated: Secondary | ICD-10-CM | POA: Diagnosis not present

## 2018-12-21 DIAGNOSIS — I10 Essential (primary) hypertension: Secondary | ICD-10-CM | POA: Diagnosis not present

## 2019-01-20 DIAGNOSIS — R0789 Other chest pain: Secondary | ICD-10-CM | POA: Diagnosis not present

## 2019-01-20 DIAGNOSIS — E1140 Type 2 diabetes mellitus with diabetic neuropathy, unspecified: Secondary | ICD-10-CM | POA: Diagnosis not present

## 2019-01-20 DIAGNOSIS — R569 Unspecified convulsions: Secondary | ICD-10-CM | POA: Diagnosis not present

## 2019-01-20 DIAGNOSIS — I10 Essential (primary) hypertension: Secondary | ICD-10-CM | POA: Diagnosis not present

## 2019-01-20 DIAGNOSIS — J4520 Mild intermittent asthma, uncomplicated: Secondary | ICD-10-CM | POA: Diagnosis not present

## 2019-01-20 DIAGNOSIS — E119 Type 2 diabetes mellitus without complications: Secondary | ICD-10-CM | POA: Diagnosis not present

## 2019-01-20 DIAGNOSIS — Z794 Long term (current) use of insulin: Secondary | ICD-10-CM | POA: Diagnosis not present

## 2019-01-20 DIAGNOSIS — E1165 Type 2 diabetes mellitus with hyperglycemia: Secondary | ICD-10-CM | POA: Diagnosis not present

## 2019-01-25 ENCOUNTER — Inpatient Hospital Stay (HOSPITAL_BASED_OUTPATIENT_CLINIC_OR_DEPARTMENT_OTHER)
Admission: EM | Admit: 2019-01-25 | Discharge: 2019-01-28 | DRG: 287 | Disposition: A | Discharge status: Home / Self Care | Payer: Medicare Other | Attending: Internal Medicine | Admitting: Internal Medicine

## 2019-01-25 ENCOUNTER — Encounter (HOSPITAL_BASED_OUTPATIENT_CLINIC_OR_DEPARTMENT_OTHER): Payer: Self-pay

## 2019-01-25 ENCOUNTER — Emergency Department (HOSPITAL_BASED_OUTPATIENT_CLINIC_OR_DEPARTMENT_OTHER): Payer: Medicare Other

## 2019-01-25 ENCOUNTER — Other Ambulatory Visit: Payer: Self-pay

## 2019-01-25 DIAGNOSIS — Z79891 Long term (current) use of opiate analgesic: Secondary | ICD-10-CM | POA: Diagnosis not present

## 2019-01-25 DIAGNOSIS — I119 Hypertensive heart disease without heart failure: Secondary | ICD-10-CM | POA: Diagnosis present

## 2019-01-25 DIAGNOSIS — I10 Essential (primary) hypertension: Secondary | ICD-10-CM

## 2019-01-25 DIAGNOSIS — I959 Hypotension, unspecified: Secondary | ICD-10-CM | POA: Diagnosis not present

## 2019-01-25 DIAGNOSIS — Z9842 Cataract extraction status, left eye: Secondary | ICD-10-CM | POA: Diagnosis not present

## 2019-01-25 DIAGNOSIS — E109 Type 1 diabetes mellitus without complications: Secondary | ICD-10-CM | POA: Diagnosis not present

## 2019-01-25 DIAGNOSIS — Z9989 Dependence on other enabling machines and devices: Secondary | ICD-10-CM | POA: Diagnosis not present

## 2019-01-25 DIAGNOSIS — G40909 Epilepsy, unspecified, not intractable, without status epilepticus: Secondary | ICD-10-CM

## 2019-01-25 DIAGNOSIS — I259 Chronic ischemic heart disease, unspecified: Secondary | ICD-10-CM | POA: Diagnosis not present

## 2019-01-25 DIAGNOSIS — K219 Gastro-esophageal reflux disease without esophagitis: Secondary | ICD-10-CM | POA: Diagnosis not present

## 2019-01-25 DIAGNOSIS — E1040 Type 1 diabetes mellitus with diabetic neuropathy, unspecified: Secondary | ICD-10-CM | POA: Diagnosis not present

## 2019-01-25 DIAGNOSIS — Z79899 Other long term (current) drug therapy: Secondary | ICD-10-CM

## 2019-01-25 DIAGNOSIS — Z794 Long term (current) use of insulin: Secondary | ICD-10-CM | POA: Diagnosis not present

## 2019-01-25 DIAGNOSIS — Z9841 Cataract extraction status, right eye: Secondary | ICD-10-CM | POA: Diagnosis not present

## 2019-01-25 DIAGNOSIS — I200 Unstable angina: Secondary | ICD-10-CM | POA: Diagnosis not present

## 2019-01-25 DIAGNOSIS — G4733 Obstructive sleep apnea (adult) (pediatric): Secondary | ICD-10-CM

## 2019-01-25 DIAGNOSIS — Z20828 Contact with and (suspected) exposure to other viral communicable diseases: Secondary | ICD-10-CM | POA: Diagnosis present

## 2019-01-25 DIAGNOSIS — Z90710 Acquired absence of both cervix and uterus: Secondary | ICD-10-CM

## 2019-01-25 DIAGNOSIS — E119 Type 2 diabetes mellitus without complications: Secondary | ICD-10-CM | POA: Diagnosis not present

## 2019-01-25 DIAGNOSIS — I25110 Atherosclerotic heart disease of native coronary artery with unstable angina pectoris: Principal | ICD-10-CM | POA: Diagnosis present

## 2019-01-25 DIAGNOSIS — Z7982 Long term (current) use of aspirin: Secondary | ICD-10-CM | POA: Diagnosis not present

## 2019-01-25 DIAGNOSIS — E11610 Type 2 diabetes mellitus with diabetic neuropathic arthropathy: Secondary | ICD-10-CM | POA: Diagnosis not present

## 2019-01-25 DIAGNOSIS — R079 Chest pain, unspecified: Secondary | ICD-10-CM | POA: Diagnosis not present

## 2019-01-25 DIAGNOSIS — Z6837 Body mass index (BMI) 37.0-37.9, adult: Secondary | ICD-10-CM

## 2019-01-25 DIAGNOSIS — Z833 Family history of diabetes mellitus: Secondary | ICD-10-CM

## 2019-01-25 DIAGNOSIS — E1169 Type 2 diabetes mellitus with other specified complication: Secondary | ICD-10-CM | POA: Diagnosis not present

## 2019-01-25 DIAGNOSIS — Z961 Presence of intraocular lens: Secondary | ICD-10-CM | POA: Diagnosis not present

## 2019-01-25 DIAGNOSIS — E1165 Type 2 diabetes mellitus with hyperglycemia: Secondary | ICD-10-CM | POA: Diagnosis not present

## 2019-01-25 DIAGNOSIS — Z8249 Family history of ischemic heart disease and other diseases of the circulatory system: Secondary | ICD-10-CM | POA: Diagnosis not present

## 2019-01-25 DIAGNOSIS — R0789 Other chest pain: Secondary | ICD-10-CM | POA: Diagnosis not present

## 2019-01-25 LAB — CBC
HCT: 39.3 % (ref 36.0–46.0)
Hemoglobin: 12.4 g/dL (ref 12.0–15.0)
MCH: 28.9 pg (ref 26.0–34.0)
MCHC: 31.6 g/dL (ref 30.0–36.0)
MCV: 91.6 fL (ref 80.0–100.0)
Platelets: 246 10*3/uL (ref 150–400)
RBC: 4.29 MIL/uL (ref 3.87–5.11)
RDW: 13.6 % (ref 11.5–15.5)
WBC: 9.3 10*3/uL (ref 4.0–10.5)
nRBC: 0 % (ref 0.0–0.2)

## 2019-01-25 LAB — TROPONIN I (HIGH SENSITIVITY)
Troponin I (High Sensitivity): 5 ng/L (ref <18)
Troponin I (High Sensitivity): 5 ng/L (ref <18)
Troponin I (High Sensitivity): 6 ng/L (ref <18)

## 2019-01-25 LAB — BASIC METABOLIC PANEL
Anion gap: 8 (ref 5–15)
BUN: 18 mg/dL (ref 8–23)
CO2: 27 mmol/L (ref 22–32)
Calcium: 9.4 mg/dL (ref 8.9–10.3)
Chloride: 102 mmol/L (ref 98–111)
Creatinine, Ser: 1.09 mg/dL — ABNORMAL HIGH (ref 0.44–1.00)
GFR calc Af Amer: 59 mL/min — ABNORMAL LOW (ref >60)
GFR calc non Af Amer: 51 mL/min — ABNORMAL LOW (ref >60)
Glucose, Bld: 127 mg/dL — ABNORMAL HIGH (ref 70–99)
Potassium: 3.1 mmol/L — ABNORMAL LOW (ref 3.5–5.1)
Sodium: 137 mmol/L (ref 135–145)

## 2019-01-25 LAB — GLUCOSE, CAPILLARY: Glucose-Capillary: 90 mg/dL (ref 70–99)

## 2019-01-25 LAB — MAGNESIUM: Magnesium: 2.1 mg/dL (ref 1.7–2.4)

## 2019-01-25 LAB — SARS CORONAVIRUS 2 BY RT PCR (HOSPITAL ORDER, PERFORMED IN ~~LOC~~ HOSPITAL LAB): SARS Coronavirus 2: NEGATIVE

## 2019-01-25 LAB — CBG MONITORING, ED: Glucose-Capillary: 78 mg/dL (ref 70–99)

## 2019-01-25 LAB — BRAIN NATRIURETIC PEPTIDE: B Natriuretic Peptide: 44.9 pg/mL (ref 0.0–100.0)

## 2019-01-25 MED ORDER — DORZOLAMIDE HCL 2 % OP SOLN
1.0000 [drp] | Freq: Three times a day (TID) | OPHTHALMIC | Status: DC
  Administered 2019-01-26 – 2019-01-28 (×5): 1 [drp] via OPHTHALMIC
  Filled 2019-01-25 (×2): qty 10

## 2019-01-25 MED ORDER — TOPIRAMATE 100 MG PO TABS
200.0000 mg | ORAL_TABLET | Freq: Two times a day (BID) | ORAL | Status: DC
  Administered 2019-01-25 – 2019-01-28 (×6): 200 mg via ORAL
  Filled 2019-01-25 (×6): qty 2

## 2019-01-25 MED ORDER — SODIUM CHLORIDE 0.9 % IV BOLUS
500.0000 mL | Freq: Once | INTRAVENOUS | Status: AC
  Administered 2019-01-25: 500 mL via INTRAVENOUS

## 2019-01-25 MED ORDER — ACETAMINOPHEN 325 MG PO TABS
650.0000 mg | ORAL_TABLET | ORAL | Status: DC | PRN
  Administered 2019-01-25: 23:00:00 650 mg via ORAL
  Filled 2019-01-25: qty 2

## 2019-01-25 MED ORDER — LOSARTAN POTASSIUM 50 MG PO TABS
50.0000 mg | ORAL_TABLET | Freq: Every day | ORAL | Status: DC
  Administered 2019-01-26 – 2019-01-28 (×3): 50 mg via ORAL
  Filled 2019-01-25 (×3): qty 1

## 2019-01-25 MED ORDER — MORPHINE SULFATE (PF) 2 MG/ML IV SOLN
2.0000 mg | INTRAVENOUS | Status: DC | PRN
  Administered 2019-01-26: 2 mg via INTRAVENOUS
  Filled 2019-01-25: qty 1

## 2019-01-25 MED ORDER — PROPRANOLOL HCL 40 MG PO TABS
40.0000 mg | ORAL_TABLET | Freq: Two times a day (BID) | ORAL | Status: DC
  Administered 2019-01-25 – 2019-01-28 (×6): 40 mg via ORAL
  Filled 2019-01-25 (×7): qty 1

## 2019-01-25 MED ORDER — POTASSIUM CHLORIDE CRYS ER 20 MEQ PO TBCR
20.0000 meq | EXTENDED_RELEASE_TABLET | Freq: Once | ORAL | Status: AC
  Administered 2019-01-25: 20 meq via ORAL
  Filled 2019-01-25: qty 1

## 2019-01-25 MED ORDER — MORPHINE SULFATE (PF) 4 MG/ML IV SOLN
4.0000 mg | Freq: Once | INTRAVENOUS | Status: AC
  Administered 2019-01-25: 14:00:00 4 mg via INTRAVENOUS
  Filled 2019-01-25: qty 1

## 2019-01-25 MED ORDER — FAMOTIDINE 20 MG PO TABS
20.0000 mg | ORAL_TABLET | Freq: Two times a day (BID) | ORAL | Status: DC
  Administered 2019-01-25 – 2019-01-28 (×6): 20 mg via ORAL
  Filled 2019-01-25 (×6): qty 1

## 2019-01-25 MED ORDER — LEVETIRACETAM 750 MG PO TABS
750.0000 mg | ORAL_TABLET | Freq: Two times a day (BID) | ORAL | Status: DC
  Administered 2019-01-25 – 2019-01-28 (×6): 750 mg via ORAL
  Filled 2019-01-25 (×7): qty 1

## 2019-01-25 MED ORDER — LIRAGLUTIDE 18 MG/3ML ~~LOC~~ SOPN: 1.8000 mg | PEN_INJECTOR | Freq: Every day | SUBCUTANEOUS | Status: DC

## 2019-01-25 MED ORDER — GABAPENTIN 300 MG PO CAPS
300.0000 mg | ORAL_CAPSULE | Freq: Three times a day (TID) | ORAL | Status: DC
  Administered 2019-01-25 – 2019-01-28 (×7): 300 mg via ORAL
  Filled 2019-01-25 (×8): qty 1

## 2019-01-25 MED ORDER — ENOXAPARIN SODIUM 40 MG/0.4ML ~~LOC~~ SOLN
40.0000 mg | SUBCUTANEOUS | Status: DC
  Administered 2019-01-25 – 2019-01-27 (×3): 40 mg via SUBCUTANEOUS
  Filled 2019-01-25 (×3): qty 0.4

## 2019-01-25 MED ORDER — POTASSIUM CHLORIDE 10 MEQ/100ML IV SOLN
10.0000 meq | Freq: Once | INTRAVENOUS | Status: AC
  Administered 2019-01-25: 16:00:00 10 meq via INTRAVENOUS
  Filled 2019-01-25: qty 100

## 2019-01-25 MED ORDER — ALBUTEROL SULFATE (2.5 MG/3ML) 0.083% IN NEBU: 3.0000 mL | INHALATION_SOLUTION | Freq: Four times a day (QID) | RESPIRATORY_TRACT | Status: DC | PRN

## 2019-01-25 MED ORDER — VITAMIN D3 25 MCG (1000 UNIT) PO TABS
1000.0000 [IU] | ORAL_TABLET | Freq: Every day | ORAL | Status: DC
  Administered 2019-01-26 – 2019-01-28 (×3): 1000 [IU] via ORAL
  Filled 2019-01-25 (×6): qty 1

## 2019-01-25 MED ORDER — POTASSIUM CHLORIDE 10 MEQ/100ML IV SOLN: 10.0000 meq | Freq: Once | INTRAVENOUS | Status: DC

## 2019-01-25 MED ORDER — SODIUM CHLORIDE 0.9% FLUSH
3.0000 mL | Freq: Once | INTRAVENOUS | Status: DC
  Filled 2019-01-25: qty 3

## 2019-01-25 MED ORDER — ONDANSETRON HCL 4 MG/2ML IJ SOLN
4.0000 mg | Freq: Once | INTRAMUSCULAR | Status: AC
  Administered 2019-01-25: 4 mg via INTRAVENOUS
  Filled 2019-01-25: qty 2

## 2019-01-25 MED ORDER — HYDROCHLOROTHIAZIDE 25 MG PO TABS
25.0000 mg | ORAL_TABLET | Freq: Every day | ORAL | Status: DC
  Administered 2019-01-26: 09:00:00 25 mg via ORAL
  Filled 2019-01-25: qty 1

## 2019-01-25 MED ORDER — POTASSIUM CHLORIDE 10 MEQ/100ML IV SOLN
10.0000 meq | Freq: Once | INTRAVENOUS | Status: AC
  Administered 2019-01-25: 15:00:00 10 meq via INTRAVENOUS
  Filled 2019-01-25: qty 100

## 2019-01-25 MED ORDER — ASPIRIN 81 MG PO CHEW
81.0000 mg | CHEWABLE_TABLET | Freq: Every day | ORAL | Status: DC
  Administered 2019-01-26 – 2019-01-28 (×3): 81 mg via ORAL
  Filled 2019-01-25 (×3): qty 1

## 2019-01-25 MED ORDER — ASPIRIN 81 MG PO CHEW
324.0000 mg | CHEWABLE_TABLET | Freq: Once | ORAL | Status: AC
  Administered 2019-01-25: 14:00:00 324 mg via ORAL
  Filled 2019-01-25: qty 4

## 2019-01-25 MED ORDER — ONDANSETRON HCL 4 MG/2ML IJ SOLN: 4.0000 mg | Freq: Four times a day (QID) | INTRAMUSCULAR | Status: DC | PRN

## 2019-01-25 MED ORDER — MORPHINE SULFATE (PF) 4 MG/ML IV SOLN
4.0000 mg | Freq: Once | INTRAVENOUS | Status: AC
  Administered 2019-01-25: 16:00:00 4 mg via INTRAVENOUS
  Filled 2019-01-25: qty 1

## 2019-01-25 NOTE — H&P (Signed)
History and Physical    [name] [name] YHC:[id number] DOB: [birth year] DOA: 01/25/2019  PCP: Benito Mccreedy, MD  Patient coming from: Home  I have personally briefly reviewed patient's old medical records in [place]  Chief Complaint: CP  HPI: [name] [name] is a 73 y.o. female with medical history significant of DM2, HTN, OSA.  Patient presents to the ED at [facility] with c/o CP.  CP is sharp, not worse with exertion, and relieved by NTG.  CP initially onset last week. CP is a heaviness, L sided, and does radiate to B arms and back.  Saw PCP last week, prescribed SL NTG which she has been intermittently using since then, sent to ED today.   ED Course: Trop neg X2.  EKG as below (not showing up in Epic for some reason):   Cards called by EDP and patient transferred for admission.   Review of Systems: As per HPI, otherwise all review of systems negative.  Past Medical History:  Diagnosis Date  . Arthritis    "all over my body" (12/28/2012)  . Chronic bronchitis ([place])    "get it q yr" (12/28/2012)  . Chronic lower back pain   . Enlarged heart   . GERD (gastroesophageal reflux disease)   . [place] mal seizure [facility]) ~ 1962   "last sz was 08/2012; long time before that" (12/28/2012)  . DVVOHYWV(371.0)    "maybe 2/month" (12/28/2012)  . Hypertension   . OSA on CPAP   . Type II diabetes mellitus ([place])     Past Surgical History:  Procedure Laterality Date  . ABDOMINAL HYSTERECTOMY  1970's  . CATARACT EXTRACTION W/ INTRAOCULAR LENS  IMPLANT, BILATERAL Bilateral ~ 2012  . DILATION AND CURETTAGE OF UTERUS  1970's  . LEFT HEART CATHETERIZATION WITH CORONARY ANGIOGRAM N/A 03/02/2013   Procedure: LEFT HEART CATHETERIZATION WITH CORONARY ANGIOGRAM;  Surgeon: Laverda Page, MD;  Location: [facility] CATH LAB;  Service: Cardiovascular;  Laterality: N/A;     reports that she has never smoked. She has never used smokeless tobacco. She reports that she does not drink alcohol or use drugs.  No  Known Allergies  Family History  Problem Relation Age of Onset  . Diabetes type II Sister   . Coronary artery disease Sister   . Seizures Sister   . Seizures Daughter   . Heart Problems Sister 44       CABG     Prior to Admission medications   Medication Sig Start Date End Date Taking? Authorizing Provider  albuterol (PROVENTIL HFA;VENTOLIN HFA) 108 (90 BASE) MCG/ACT inhaler Inhale 2 puffs into the lungs every 6 (six) hours as needed for wheezing.   Yes [provider]  aspirin 81 MG chewable tablet Chew 81 mg by mouth daily.   Yes [provider]  cholecalciferol (VITAMIN D) 1000 UNITS tablet Take 1 tablet (1,000 Units total) by mouth daily. 12/31/12  Yes Viyuoh, Adeline C, MD  dorzolamide (TRUSOPT) 2 % ophthalmic solution Place 1 drop into both eyes 3 (three) times daily.   Yes [provider]  gabapentin (NEURONTIN) 300 MG capsule Take 300 mg by mouth 3 (three) times daily.   Yes [provider]  hydrochlorothiazide (HYDRODIURIL) 25 MG tablet Take 25 mg by mouth daily.     Yes [provider]  levETIRAcetam (KEPPRA) 750 MG tablet Take 1 tablet (750 mg total) by mouth 2 (two) times daily. Please call [phone] to schedule an appt for continued refills. Patient taking differently: Take 750  mg by mouth 2 (two) times daily.  04/01/17  Yes Marcial Pacas, MD  Liraglutide (VICTOZA) 18 MG/3ML SOPN Inject 1.8 mg into the skin daily.  04/03/15  Yes [provider]  losartan (COZAAR) 50 MG tablet Take 50 mg by mouth daily.   Yes [provider]  nitroGLYCERIN (NITROLINGUAL) 0.4 MG/SPRAY spray Place 1 spray under the tongue every 5 (five) minutes x 3 doses as needed for chest pain.   Yes [provider]  ondansetron (ZOFRAN ODT) 4 MG disintegrating tablet Take 1 tablet (4 mg total) by mouth every 8 (eight) hours as needed. Patient taking differently: Take 4 mg by mouth every 8 (eight) hours as needed for nausea.  01/30/16  Yes Marcial Pacas, MD  propranolol (INDERAL) 40 MG tablet Take 1 tablet (40 mg total) by mouth 2 (two) times daily. 01/30/16  Yes Marcial Pacas, MD  ranitidine (ZANTAC) 150 MG tablet Take 150 mg by mouth 2 (two) times daily.     Yes [provider]  SUMAtriptan (IMITREX) 25 MG tablet Take 1 tab at onset of migraine.  May repeat in 2 hrs, prn.  Max: 2 tab/24 hrs - 10 tabs/30 days. Patient taking differently: Take 25 mg by mouth See admin instructions. Take 1 tab at onset of migraine.  May repeat in 2 hrs, prn.  Max: 2 tab/24 hrs - 10 tabs/30 days. 02/29/16  Yes Marcial Pacas, MD  topiramate (TOPAMAX) 200 MG tablet TAKE 1 TABLET BY MOUTH TWO  TIMES DAILY Patient taking differently: Take 200 mg by mouth 2 (two) times daily. Take 1 tablet by mouth two  times daily 06/25/16  Yes Dennie Bible, NP  traMADol (ULTRAM) 50 MG tablet Take 1 tablet (50 mg total) by mouth every 6 (six) hours as needed. Patient taking differently: Take 50 mg by mouth every 6 (six) hours as needed for moderate pain.  05/15/16  Yes Marcial Pacas, MD    Physical Exam: Vitals:   01/25/19 1800 01/25/19 1900 01/25/19 1930 01/25/19 2050  BP: 122/72 136/76 135/72 (!) 124/53  Pulse: (!) 55 (!) 52 (!) 53 (!) 52  Resp: 10 11 10    Temp:    98.2 F (36.8 C)  TempSrc:    Oral  SpO2: 95% 96% 97% 100%  Weight:      Height:        Constitutional: NAD, calm, comfortable Eyes: PERRL, lids and conjunctivae normal ENMT: Mucous membranes are moist. Posterior pharynx clear of any exudate or lesions.Normal dentition.  Neck: normal, supple, no masses, no thyromegaly Respiratory: clear to auscultation bilaterally, no wheezing, no crackles. Normal respiratory effort. No accessory muscle use.  Cardiovascular: Regular rate and rhythm, no murmurs / rubs / gallops. No extremity edema. 2+ pedal pulses. No carotid bruits.  Abdomen: no tenderness, no masses palpated. No hepatosplenomegaly. Bowel sounds positive.  Musculoskeletal: no clubbing / cyanosis. No  joint deformity upper and lower extremities. Good ROM, no contractures. Normal muscle tone.  Skin: no rashes, lesions, ulcers. No induration Neurologic: CN 2-12 grossly intact. Sensation intact, DTR normal. Strength 5/5 in all 4.  Psychiatric: Normal judgment and insight. Alert and oriented x 3. Normal mood.    Labs on Admission: I have personally reviewed following labs and imaging studies  CBC: Recent Labs  Lab 01/25/19 1302  WBC 9.3  HGB 12.4  HCT 39.3  MCV 91.6  PLT 537   Basic Metabolic Panel: Recent Labs  Lab 01/25/19 1302  NA 137  K 3.1*  CL  102  CO2 27  GLUCOSE 127*  BUN 18  CREATININE 1.09*  CALCIUM 9.4  MG 2.1   GFR: Estimated Creatinine Clearance: 48.5 mL/min (A) (by C-G formula based on SCr of 1.09 mg/dL (H)). Liver Function Tests: No results for input(s): AST, ALT, ALKPHOS, BILITOT, PROT, ALBUMIN in the last 168 hours. No results for input(s): LIPASE, AMYLASE in the last 168 hours. No results for input(s): AMMONIA in the last 168 hours. Coagulation Profile: No results for input(s): INR, PROTIME in the last 168 hours. Cardiac Enzymes: No results for input(s): CKTOTAL, CKMB, CKMBINDEX, TROPONINI in the last 168 hours. BNP (last 3 results) No results for input(s): PROBNP in the last 8760 hours. HbA1C: No results for input(s): HGBA1C in the last 72 hours. CBG: Recent Labs  Lab 01/25/19 2002 01/25/19 2127  GLUCAP 78 90   Lipid Profile: No results for input(s): CHOL, HDL, LDLCALC, TRIG, CHOLHDL, LDLDIRECT in the last 72 hours. Thyroid Function Tests: No results for input(s): TSH, T4TOTAL, FREET4, T3FREE, THYROIDAB in the last 72 hours. Anemia Panel: No results for input(s): VITAMINB12, FOLATE, FERRITIN, TIBC, IRON, RETICCTPCT in the last 72 hours. Urine analysis:    Component Value Date/Time   COLORURINE YELLOW 06/21/2015 2245   APPEARANCEUR CLEAR 06/21/2015 2245   LABSPEC 1.013 06/21/2015 2245   PHURINE 7.0 06/21/2015 2245   GLUCOSEU NEGATIVE  06/21/2015 2245   HGBUR NEGATIVE 06/21/2015 2245   BILIRUBINUR NEGATIVE 06/21/2015 2245   KETONESUR NEGATIVE 06/21/2015 2245   PROTEINUR NEGATIVE 06/21/2015 2245   UROBILINOGEN 0.2 06/18/2011 1750   NITRITE NEGATIVE 06/21/2015 2245   LEUKOCYTESUR NEGATIVE 06/21/2015 2245    Radiological Exams on Admission: Dg Chest 2 View  Result Date: 01/25/2019 CLINICAL DATA:  Acute chest pain for 1 week. EXAM: CHEST - 2 VIEW COMPARISON:  03/12/2016 and prior radiographs FINDINGS: Cardiomegaly noted. There is no evidence of focal airspace disease, pulmonary edema, suspicious pulmonary nodule/mass, pleural effusion, or pneumothorax. No acute bony abnormalities are identified. IMPRESSION: Cardiomegaly without evidence of acute cardiopulmonary disease. Electronically Signed   By: Margarette Canada M.D.   On: 01/25/2019 12:46    EKG: Independently reviewed.  Assessment/Plan Principal Problem:   Chest pain Active Problems:   Diabetes (HCC)   Seizure disorder (HCC)   HTN (hypertension)   OSA on CPAP    1. CP - 1. CP obs pathway 2. Serial trops 3. Tele monitor 4. NPO after MN 5. Cards eval in AM 2. HTN - continue home BP meds 3. DM2 - 1. Continue Victoza 4. OSA - 1. Continue CPAP 5. Seizure disorder - 1. Continue Keppra and Topamax  DVT prophylaxis: Lovenox Code Status: Full Family Communication: No family in room Disposition Plan: Home after admit Consults called: None, (Cards called by EDP) Admission status: place in obs    Amol Domanski, [place] Hospitalists  How to contact the [facility] Attending or Consulting provider [place] or covering provider during after hours [place], for this patient?  1. Check the care team in [facility] and look for a) attending/consulting TRH provider listed and b) the [facility] team listed 2. Log into [email or web address]  Amion Physician Scheduling and messaging for groups and whole hospitals  On call and physician scheduling software for group practices, residents,  hospitalists and other medical providers for call, clinic, rotation and shift schedules. OnCall [employer] is a hospital-wide system for scheduling doctors and paging doctors on call. EasyPlot is for scientific plotting and data analysis.  [email or web address]  and use [place]'s  universal password to access. If you do not have the password, please contact the hospital operator.  3. Locate the [facility] provider you are looking for under [facility] and page to a number that you can be directly reached. 4. If you still have difficulty reaching the provider, please page the [facility] (Director on Call) for the Hospitalists listed on amion for assistance.  01/25/2019, 11:00 PM

## 2019-01-25 NOTE — ED Notes (Signed)
Report to Timmothy Sours, RN at [place].

## 2019-01-25 NOTE — ED Notes (Signed)
Pt placed on bedside commode. Pt unsteady

## 2019-01-25 NOTE — ED Provider Notes (Signed)
[facility] EMERGENCY DEPARTMENT Provider Note   CSN: [id number] Arrival date & time: 01/25/19  1203    History   Chief Complaint Chief Complaint  Patient presents with  . Chest Pain    HPI Mliss Wedin is a 73 y.o. female.  HPI: A 73 year old patient with a history of treated diabetes, hypertension and obesity presents for evaluation of chest pain. Initial onset of pain was more than 6 hours ago. The patient's chest pain is sharp, is not worse with exertion and is relieved by nitroglycerin. The patient complains of nausea. The patient's chest pain is middle- or left-sided, is not well-localized, is not described as heaviness/pressure/tightness and does radiate to the arms/jaw/neck. The patient denies diaphoresis. The patient has no history of stroke, has no history of peripheral artery disease, has not smoked in the past 90 days, has no relevant family history of coronary artery disease (first degree relative at less than age 30) and has no history of hypercholesterolemia.   HPI  Shamyra Farias is a 73 y.o. female, with a history of GERD, seizures, HTN, DM, obesity, presenting to the ED with chest pain beginning last week. She experiences this pain daily, sometimes throughout the day. Pain is central chest, stabbing, constant, radiating to the bilateral arms and through to the back. Pain was 8/10 when she awoke this morning, improved to 7/10 after SL NTG just prior to arrival.  Pain is accompanied by shortness of breath, nausea, lightheadedness. She also notes a new complaint of orthopnea, stating she starting sleeping on three pillows last week. Occasional lower extremity edema.   Patient states she saw her PCP last week for her chest pain.  He prescribed her sublingual nitroglycerin, which she has been using intermittently since then.  He again saw her today and recommended she come to the emergency room with concerns for cardiac origin of her pain.  Denies fever/chills, cough,  abdominal pain, vomiting, diarrhea, focal weakness, numbness, or any other complaints.      Past Medical History:  Diagnosis Date  . Arthritis    "all over my body" (12/28/2012)  . Chronic bronchitis ([place])    "get it q yr" (12/28/2012)  . Chronic lower back pain   . Enlarged heart   . GERD (gastroesophageal reflux disease)   . [place] mal seizure [facility]) ~ 1962   "last sz was 08/2012; long time before that" (12/28/2012)  . SWFUXNAT(557.3)    "maybe 2/month" (12/28/2012)  . Hypertension   . OSA on CPAP   . Type II diabetes mellitus [facility])     Patient Active Problem List   Diagnosis Date Noted  . Chest pain 01/25/2019  . Seizure disorder, grand mal ([place]) 09/20/2014  . Chronic migraine without aura, not intractable, without status migrainosus 03/14/2014  . Abnormality of gait 11/29/2013  . HTN (hypertension) 12/28/2012  . OSA on CPAP 12/28/2012  . Back pain 06/19/2011  . Diabetes ([place]) 06/19/2011  . Seizure disorder ([place]) 06/19/2011    Past Surgical History:  Procedure Laterality Date  . ABDOMINAL HYSTERECTOMY  1970's  . CATARACT EXTRACTION W/ INTRAOCULAR LENS  IMPLANT, BILATERAL Bilateral ~ 2012  . DILATION AND CURETTAGE OF UTERUS  1970's  . LEFT HEART CATHETERIZATION WITH CORONARY ANGIOGRAM N/A 03/02/2013   Procedure: LEFT HEART CATHETERIZATION WITH CORONARY ANGIOGRAM;  Surgeon: Laverda Page, MD;  Location: [facility] CATH LAB;  Service: Cardiovascular;  Laterality: N/A;     OB History   No obstetric history on file.  Home Medications    Prior to Admission medications   Medication Sig Start Date End Date Taking? Authorizing Provider  albuterol (PROVENTIL HFA;VENTOLIN HFA) 108 (90 BASE) MCG/ACT inhaler Inhale 2 puffs into the lungs every 6 (six) hours as needed for wheezing.    [provider]  aspirin 81 MG chewable tablet Chew 81 mg by mouth daily.    [provider]  azithromycin (ZITHROMAX) 250 MG tablet Take 1 tablet (250 mg total) by mouth daily.  Take 1 every day until finished. 03/13/16   Mesner, Corene Cornea, MD  cholecalciferol (VITAMIN D) 1000 UNITS tablet Take 1 tablet (1,000 Units total) by mouth daily. 12/31/12   Viyuoh, Alison Stalling, MD  dorzolamide (TRUSOPT) 2 % ophthalmic solution Place 1 drop into both eyes 3 (three) times daily.    [provider]  gabapentin (NEURONTIN) 300 MG capsule Take 300 mg by mouth 3 (three) times daily.    [provider]  hydrochlorothiazide (HYDRODIURIL) 25 MG tablet Take 25 mg by mouth daily.      [provider]  Insulin Detemir (LEVEMIR FLEXTOUCH) 100 UNIT/ML Pen Inject into the skin. 03/14/15 03/13/16  [provider]  levETIRAcetam (KEPPRA) 750 MG tablet Take 1 tablet (750 mg total) by mouth 2 (two) times daily. Please call [phone] to schedule an appt for continued refills. 04/01/17   Marcial Pacas, MD  Liraglutide (VICTOZA) 18 MG/3ML SOPN Inject 1.8 mg into the skin. 04/03/15   [provider]  losartan (COZAAR) 50 MG tablet Take 50 mg by mouth daily.    [provider]  ondansetron (ZOFRAN ODT) 4 MG disintegrating tablet Take 1 tablet (4 mg total) by mouth every 8 (eight) hours as needed. 01/30/16   Marcial Pacas, MD  predniSONE (DELTASONE) 20 MG tablet 2 tabs po daily x 4 days 03/12/16   Mesner, Corene Cornea, MD  propranolol (INDERAL) 40 MG tablet Take 1 tablet (40 mg total) by mouth 2 (two) times daily. 01/30/16   Marcial Pacas, MD  ranitidine (ZANTAC) 150 MG tablet Take 150 mg by mouth 2 (two) times daily.      [provider]  SUMAtriptan (IMITREX) 25 MG tablet Take 1 tab at onset of migraine.  May repeat in 2 hrs, prn.  Max: 2 tab/24 hrs - 10 tabs/30 days. 02/29/16   Marcial Pacas, MD  topiramate (TOPAMAX) 200 MG tablet TAKE 1 TABLET BY MOUTH TWO  TIMES DAILY 06/25/16   Dennie Bible, NP  traMADol (ULTRAM) 50 MG tablet Take 1 tablet (50 mg total) by mouth every 6 (six) hours as needed. 05/15/16   Marcial Pacas, MD    Family History Family History  Problem  Relation Age of Onset  . Diabetes type II Sister   . Coronary artery disease Sister   . Seizures Sister   . Seizures Daughter   . Heart Problems Sister 48       CABG    Social History Social History   Tobacco Use  . Smoking status: Never Smoker  . Smokeless tobacco: Never Used  Substance Use Topics  . Alcohol use: No  . Drug use: No     Allergies   Patient has no known allergies.   Review of Systems Review of Systems  Constitutional: Negative for chills, diaphoresis and fever.  Respiratory: Positive for shortness of breath. Negative for cough.   Cardiovascular: Positive for chest pain and leg swelling.  Gastrointestinal: Positive for nausea. Negative for abdominal pain, diarrhea and vomiting.  Neurological: Positive for  light-headedness. Negative for syncope, weakness and numbness.  All other systems reviewed and are negative.    Physical Exam Updated Vital Signs BP (!) 129/57 (BP Location: Right Arm)   Pulse 69   Temp 98.4 F (36.9 C) (Oral)   Resp 18   Ht 5' (1.524 m)   Wt 93.9 kg   SpO2 99%   BMI 40.43 kg/m   Physical Exam Vitals signs and nursing note reviewed.  Constitutional:      General: She is not in acute distress.    Appearance: She is well-developed. She is not diaphoretic.  HENT:     Head: Normocephalic and atraumatic.     Mouth/Throat:     Mouth: Mucous membranes are moist.     Pharynx: Oropharynx is clear.  Eyes:     Conjunctiva/sclera: Conjunctivae normal.  Neck:     Musculoskeletal: Neck supple.  Cardiovascular:     Rate and Rhythm: Normal rate and regular rhythm.     Pulses: Normal pulses.          Radial pulses are 2+ on the right side and 2+ on the left side.       Dorsalis pedis pulses are 2+ on the right side and 2+ on the left side.       Posterior tibial pulses are 2+ on the right side and 2+ on the left side.     Heart sounds: Normal heart sounds.     Comments: Tactile temperature in the extremities appropriate and equal  bilaterally. Pulmonary:     Effort: Pulmonary effort is normal. No respiratory distress.     Breath sounds: Normal breath sounds.  Abdominal:     Palpations: Abdomen is soft.     Tenderness: There is no abdominal tenderness. There is no guarding.  Musculoskeletal:     Right lower leg: No edema.     Left lower leg: No edema.  Lymphadenopathy:     Cervical: No cervical adenopathy.  Skin:    General: Skin is warm and dry.  Neurological:     Mental Status: She is alert.     Comments: Sensation grossly intact to light touch in the extremities.  Grip strengths equal bilaterally.  Strength 5/5 in all extremities. Coordination intact. Cranial nerves III-XII grossly intact. No facial droop.   Psychiatric:        Mood and Affect: Mood and affect normal.        Speech: Speech normal.        Behavior: Behavior normal.      ED Treatments / Results  Labs (all labs ordered are listed, but only abnormal results are displayed) Labs Reviewed  BASIC METABOLIC PANEL - Abnormal; Notable for the following components:      Result Value   Potassium 3.1 (*)    Glucose, Bld 127 (*)    Creatinine, Ser 1.09 (*)    GFR calc non Af Amer 51 (*)    GFR calc Af Amer 59 (*)    All other components within normal limits  SARS CORONAVIRUS 2 (HOSPITAL ORDER, [place] [facility])  CBC  BRAIN NATRIURETIC PEPTIDE  MAGNESIUM  TROPONIN I (HIGH SENSITIVITY)  TROPONIN I (HIGH SENSITIVITY)    EKG None  ED ECG REPORT   Date: 01/25/2019  Rate: 66  Rhythm: Sinus rhythm  QRS Axis: normal  Intervals: normal  ST/T Wave abnormalities: nonspecific ST changes  Conduction Disutrbances:none  Narrative Interpretation:   Old EKG Reviewed: unchanged  I have personally  reviewed the EKG tracing and agree with the computerized printout as noted.              Radiology Dg Chest 2 View  Result Date: 01/25/2019 CLINICAL DATA:  Acute chest pain for 1 week. EXAM: CHEST - 2 VIEW COMPARISON:   03/12/2016 and prior radiographs FINDINGS: Cardiomegaly noted. There is no evidence of focal airspace disease, pulmonary edema, suspicious pulmonary nodule/mass, pleural effusion, or pneumothorax. No acute bony abnormalities are identified. IMPRESSION: Cardiomegaly without evidence of acute cardiopulmonary disease. Electronically Signed   By: Margarette Canada M.D.   On: 01/25/2019 12:46    Procedures Procedures (including critical care time)  Medications Ordered in ED Medications  sodium chloride flush (NS) 0.9 % injection 3 mL (3 mLs Intravenous Not Given 01/25/19 1243)  aspirin chewable tablet 324 mg (324 mg Oral Given 01/25/19 1344)  morphine 4 MG/ML injection 4 mg (4 mg Intravenous Given 01/25/19 1421)  ondansetron (ZOFRAN) injection 4 mg (4 mg Intravenous Given 01/25/19 1419)  sodium chloride 0.9 % bolus 500 mL (0 mLs Intravenous Stopped 01/25/19 1602)  potassium chloride SA (K-DUR) CR tablet 20 mEq (20 mEq Oral Given 01/25/19 1453)  potassium chloride 10 mEq in 100 mL IVPB (0 mEq Intravenous Stopped 01/25/19 1603)    Followed by  potassium chloride 10 mEq in 100 mL IVPB ( Intravenous Stopped 01/25/19 1721)  morphine 4 MG/ML injection 4 mg (4 mg Intravenous Given 01/25/19 1602)     Initial Impression / Assessment and Plan / ED Course  I have reviewed the triage vital signs and the nursing notes.  Pertinent labs & imaging results that were available during my care of the patient were reviewed by me and considered in my medical decision making (see chart for details).  Clinical Course as of Jan 25 1851  Mon Jan 25, 2019  1950 Spoke with Dr. Alda Lea, cardiology. May not be cardiac, but admission for further workup would be a good plan. Requests admission via hospitalist.     [SJ]  1510 Discussed proposed plan for transfer and admission at [facility]. Agrees to the plan. States her pain has improved to 4/10.    [SJ]  1526 Spoke with Dr. Tamala Julian, hospitalist. Agrees to admit the patient.    [SJ]     Clinical Course User Index [SJ] ,  C, PA-C    HEAR Score: 6  Patient presents with chest pain intermittent over the last week.  It improves with nitroglycerin. EKG without evidence of acute ischemia or pathologic/symptomatic arrhythmia.  EKG also appears to be unchanged from previous. Wells criteria score is 0, indicating low risk for PE.   Dissection was considered, but thought less likely base on: History and description of the pain are not suggestive, patient is not ill-appearing, lack of risk factors, equal bilateral pulses, lack of neurologic deficits, no widened mediastinum on chest x-ray.  Chest x-ray shows cardiomegaly, which was previously noted in [place] on CT in 2019. Delta troponins negative.  COVID test negative.  Hypokalemia of 3.1 noted on lab work.  Other lab work otherwise reassuring. Due to the patient's age and risk factors we will transfer and admit her for further evaluation.   Findings and plan of care discussed with Madalyn Rob, MD. Dr. Roslynn Amble personally evaluated and examined this patient.  Final Clinical Impressions(s) / ED Diagnoses   Final diagnoses:  Nonspecific chest pain    ED Discharge Orders    None       ,   Loletha Grayer, PA-C 01/25/19 1856    Lucrezia Starch, MD 01/26/19 [phone]

## 2019-01-25 NOTE — Plan of Care (Addendum)
[facility] transfer discussed [name] Hopping, PA-C  [name] [name] is a 73 year old female with pmh HTN, DM type 2,  who presented for chest pain with radiation.  High-sensitivity troponin negative x2, potassium 3.1, and creatinine 1.09.  EKG reported to show no acute signs of ischemia.  Chest x-ray showing cardiomegaly without any acute days.  Patient received full dose aspirin, nitroglycerin, and morphine with some improvement in pain.  Potassium was replaced.  Dr. Alda Lea of cardiology was consulted and recommended admission for further work-up.  TR[facility] called to admit.  Patient's vital signs were all noted to be stable.  Accepted as observation to a telemetry bed here at [facility].  Call cardiology once patient arrives.

## 2019-01-25 NOTE — ED Triage Notes (Addendum)
Pt c/o intermittent CP x 1 week-denies cough/flu fever sx-NAD-to triage in w/c

## 2019-01-26 ENCOUNTER — Observation Stay (HOSPITAL_COMMUNITY): Payer: Medicare Other

## 2019-01-26 DIAGNOSIS — Z79891 Long term (current) use of opiate analgesic: Secondary | ICD-10-CM | POA: Diagnosis not present

## 2019-01-26 DIAGNOSIS — Z833 Family history of diabetes mellitus: Secondary | ICD-10-CM | POA: Diagnosis not present

## 2019-01-26 DIAGNOSIS — Z90710 Acquired absence of both cervix and uterus: Secondary | ICD-10-CM | POA: Diagnosis not present

## 2019-01-26 DIAGNOSIS — I200 Unstable angina: Secondary | ICD-10-CM | POA: Diagnosis not present

## 2019-01-26 DIAGNOSIS — E11610 Type 2 diabetes mellitus with diabetic neuropathic arthropathy: Secondary | ICD-10-CM | POA: Diagnosis not present

## 2019-01-26 DIAGNOSIS — E119 Type 2 diabetes mellitus without complications: Secondary | ICD-10-CM | POA: Diagnosis not present

## 2019-01-26 DIAGNOSIS — G4733 Obstructive sleep apnea (adult) (pediatric): Secondary | ICD-10-CM | POA: Diagnosis not present

## 2019-01-26 DIAGNOSIS — Z8249 Family history of ischemic heart disease and other diseases of the circulatory system: Secondary | ICD-10-CM | POA: Diagnosis not present

## 2019-01-26 DIAGNOSIS — K219 Gastro-esophageal reflux disease without esophagitis: Secondary | ICD-10-CM | POA: Diagnosis present

## 2019-01-26 DIAGNOSIS — I119 Hypertensive heart disease without heart failure: Secondary | ICD-10-CM | POA: Diagnosis not present

## 2019-01-26 DIAGNOSIS — Z961 Presence of intraocular lens: Secondary | ICD-10-CM | POA: Diagnosis present

## 2019-01-26 DIAGNOSIS — Z20828 Contact with and (suspected) exposure to other viral communicable diseases: Secondary | ICD-10-CM | POA: Diagnosis present

## 2019-01-26 DIAGNOSIS — I259 Chronic ischemic heart disease, unspecified: Secondary | ICD-10-CM | POA: Diagnosis not present

## 2019-01-26 DIAGNOSIS — Z7982 Long term (current) use of aspirin: Secondary | ICD-10-CM | POA: Diagnosis not present

## 2019-01-26 DIAGNOSIS — G40909 Epilepsy, unspecified, not intractable, without status epilepticus: Secondary | ICD-10-CM | POA: Diagnosis not present

## 2019-01-26 DIAGNOSIS — Z794 Long term (current) use of insulin: Secondary | ICD-10-CM | POA: Diagnosis not present

## 2019-01-26 DIAGNOSIS — Z6837 Body mass index (BMI) 37.0-37.9, adult: Secondary | ICD-10-CM | POA: Diagnosis not present

## 2019-01-26 DIAGNOSIS — I10 Essential (primary) hypertension: Secondary | ICD-10-CM | POA: Diagnosis not present

## 2019-01-26 DIAGNOSIS — E109 Type 1 diabetes mellitus without complications: Secondary | ICD-10-CM | POA: Diagnosis not present

## 2019-01-26 DIAGNOSIS — R0789 Other chest pain: Secondary | ICD-10-CM

## 2019-01-26 DIAGNOSIS — Z9841 Cataract extraction status, right eye: Secondary | ICD-10-CM | POA: Diagnosis not present

## 2019-01-26 DIAGNOSIS — Z9842 Cataract extraction status, left eye: Secondary | ICD-10-CM | POA: Diagnosis not present

## 2019-01-26 DIAGNOSIS — Z79899 Other long term (current) drug therapy: Secondary | ICD-10-CM | POA: Diagnosis not present

## 2019-01-26 DIAGNOSIS — I25110 Atherosclerotic heart disease of native coronary artery with unstable angina pectoris: Secondary | ICD-10-CM | POA: Diagnosis not present

## 2019-01-26 DIAGNOSIS — R079 Chest pain, unspecified: Secondary | ICD-10-CM | POA: Diagnosis not present

## 2019-01-26 DIAGNOSIS — E1040 Type 1 diabetes mellitus with diabetic neuropathy, unspecified: Secondary | ICD-10-CM | POA: Diagnosis not present

## 2019-01-26 DIAGNOSIS — I959 Hypotension, unspecified: Secondary | ICD-10-CM | POA: Diagnosis not present

## 2019-01-26 LAB — TROPONIN I (HIGH SENSITIVITY): Troponin I (High Sensitivity): 5 ng/L (ref <18)

## 2019-01-26 LAB — GLUCOSE, CAPILLARY
Glucose-Capillary: 177 mg/dL — ABNORMAL HIGH (ref 70–99)
Glucose-Capillary: 113 mg/dL — ABNORMAL HIGH (ref 70–99)
Glucose-Capillary: 138 mg/dL — ABNORMAL HIGH (ref 70–99)
Glucose-Capillary: 166 mg/dL — ABNORMAL HIGH (ref 70–99)

## 2019-01-26 LAB — HEMOGLOBIN A1C
Hgb A1c MFr Bld: 6.5 % — ABNORMAL HIGH (ref 4.8–5.6)
Mean Plasma Glucose: 139.85 mg/dL

## 2019-01-26 MED ORDER — TECHNETIUM TC 99M TETROFOSMIN IV KIT
30.0000 | PACK | Freq: Once | INTRAVENOUS | Status: AC | PRN
  Administered 2019-01-26: 30 via INTRAVENOUS

## 2019-01-26 MED ORDER — MORPHINE SULFATE (PF) 2 MG/ML IV SOLN: 2.0000 mg | INTRAVENOUS | Status: DC | PRN

## 2019-01-26 MED ORDER — REGADENOSON 0.4 MG/5ML IV SOLN
0.4000 mg | Freq: Once | INTRAVENOUS | Status: AC
  Administered 2019-01-26: 0.4 mg via INTRAVENOUS

## 2019-01-26 MED ORDER — REGADENOSON 0.4 MG/5ML IV SOLN
INTRAVENOUS | Status: AC
  Filled 2019-01-26: qty 5

## 2019-01-26 MED ORDER — DICLOFENAC SODIUM 1 % TD GEL
2.0000 g | Freq: Four times a day (QID) | TRANSDERMAL | Status: DC
  Administered 2019-01-26 – 2019-01-28 (×6): 2 g via TOPICAL
  Filled 2019-01-26: qty 100

## 2019-01-26 MED ORDER — TECHNETIUM TC 99M TETROFOSMIN IV KIT
10.0000 | PACK | Freq: Once | INTRAVENOUS | Status: AC | PRN
  Administered 2019-01-26: 11:00:00 10 via INTRAVENOUS

## 2019-01-26 MED ORDER — OXYCODONE-ACETAMINOPHEN 5-325 MG PO TABS: 1.0000 | ORAL_TABLET | Freq: Four times a day (QID) | ORAL | Status: DC | PRN

## 2019-01-26 MED ORDER — ACETAMINOPHEN 325 MG PO TABS
650.0000 mg | ORAL_TABLET | Freq: Four times a day (QID) | ORAL | Status: DC
  Administered 2019-01-26 – 2019-01-28 (×7): 650 mg via ORAL
  Filled 2019-01-26 (×6): qty 2

## 2019-01-26 MED ORDER — INSULIN ASPART 100 UNIT/ML ~~LOC~~ SOLN
0.0000 [IU] | Freq: Three times a day (TID) | SUBCUTANEOUS | Status: DC
  Administered 2019-01-26: 1 [IU] via SUBCUTANEOUS
  Administered 2019-01-27: 2 [IU] via SUBCUTANEOUS
  Administered 2019-01-27: 1 [IU] via SUBCUTANEOUS
  Administered 2019-01-28: 3 [IU] via SUBCUTANEOUS
  Administered 2019-01-28: 1 [IU] via SUBCUTANEOUS

## 2019-01-26 MED ORDER — NITROGLYCERIN 0.2 MG/HR TD PT24
0.2000 mg | MEDICATED_PATCH | Freq: Every day | TRANSDERMAL | Status: DC
  Administered 2019-01-26 – 2019-01-27 (×2): 0.2 mg via TRANSDERMAL
  Filled 2019-01-26 (×3): qty 1

## 2019-01-26 NOTE — Care Management Obs Status (Signed)
[facility] NOTIFICATION   Patient Details  Name: [name] [name] MRN: [id number] Date of Birth: 1945-10-18   Medicare Observation Status Notification Given:  Yes    Bethena Roys, RN 01/26/2019, 3:27 PM

## 2019-01-26 NOTE — Progress Notes (Signed)
PROGRESS NOTE    [name] [name]  TKZ:[id number] DOB: 22-Apr-1946 DOA: 01/25/2019 PCP: Benito Mccreedy, MD    Brief Narrative:  73 year old female who presented with chest pain.  She does have significant past medical history for type 2 diabetes mellitus, hypertension and obstructive sleep apnea.  Patient reported sharp precordial chest pain, not exertional related, relieved by nitroglycerin.  Ongoing symptoms for about a week.  On her initial physical examination blood pressure 122/72, heart rate 52, respiratory rate 10, temperature 98.2, oxygen saturation is 7%, her lungs are clear to auscultation bilaterally, heart S1-S2 present rhythmic, abdomen soft, no lower extremity edema. Sodium 137, potassium 3.1, chloride 102, bicarb 27, glucose 127, BUN 17, creatinine 1.0, magnesium 2.1, troponin 5, white count 9.3, hemoglobin 12.4, hematocrit 39.3, platelets 246.  SARS COVID-19 was negative.  Chest x-ray negative for infiltrates.  EKG 73 bpm, normal axis, normal intervals, sinus rhythm, no ST segment or T wave changes.  Patient was admitted to the hospital working diagnosis of atypical chest pain, rule out acute coronary syndrome  Assessment & Plan:   Principal Problem:   Chest pain Active Problems:   Diabetes (HCC)   Seizure disorder (HCC)   HTN (hypertension)   OSA on CPAP   1. Chest pain in the setting of coronary artery disease/ multivessel/ unstable angina. Patient with persistent chest pain, underwent nuclear stress test which resulted high risk, multiple reversible perfusion defects involving the anteroseptal, lateral and inferior walls. Will continue pain control with IV morphine and will add nitroglycerin patch. She does have some reproducible chest pain to palpation, will scheduled acetaminophen and will add topical diclofenac along with as needed oral oxycodone. Patient with negative troponins or ischemic changes on EKG, no active ischemia, but possible unstable angina.   2. T2DM with  severe neuropathy. Will continue glucose cover and monitoring with insulin sliding scale. Patient is tolerating po well. Patient has limited mobility due to neuropathy, uses wheelchair and walker at home.   3. HTN. Continue blood pressure control with hctz, propranolol and losartan.   4. OSA with obesity, BMI 36.9. Continue cpap at night, will need outpatient follow up.   5. Seizures. Will continue keppra and topiramate. No active seizures.    DVT prophylaxis: enoxaparin   Code Status:  Full  Family Communication: I spoke with patient's daughter at the bedside and all questions were addressed.  Disposition Plan/ discharge barriers: pending cardiology workup. Change to inpatient.   Body mass index is 36.94 kg/m. Malnutrition Type:      Malnutrition Characteristics:      Nutrition Interventions:     RN Pressure Injury Documentation:     Consultants:   Cardiology   Procedures:     Antimicrobials:       Subjective: Patient continue to have chest pain and radiated to her arms, no improving or worsening factors, no associated nausea or vomiting.   Objective: Vitals:   01/25/19 2354 01/26/19 0700 01/26/19 0843 01/26/19 0908  BP:  (!) 111/54 102/60 127/71  Pulse: 70 69 71   Resp:   19   Temp:  98.2 F (36.8 C) 98 F (36.7 C)   TempSrc:  Oral Oral   SpO2:  100% 97%   Weight:  85.8 kg    Height:        Intake/Output Summary (Last 24 hours) at 01/26/2019 1009 Last data filed at 01/25/2019 1724 Gross per 24 hour  Intake 101.31 ml  Output -  Net 101.31 ml   [employer]  01/25/19 1212 01/26/19 0700  Weight: 93.9 kg 85.8 kg    Examination:   General: in pain and deconditioned, ill looking appearing.  Neurology: Awake and alert, non focal  E LSL:HTDS pallor, no icterus, oral mucosa moist Cardiovascular: No JVD. S1-S2 present, rhythmic, no gallops, rubs, or murmurs. No lower extremity edema. Pulmonary: positive breath sounds bilaterally, adequate  air movement, no wheezing, rhonchi or rales. Gastrointestinal. Abdomen protuberant with no organomegaly, non tender, no rebound or guarding Skin. No rashes Musculoskeletal: no joint deformities     Data Reviewed: I have personally reviewed following labs and imaging studies  CBC: Recent Labs  Lab 01/25/19 1302  WBC 9.3  HGB 12.4  HCT 39.3  MCV 91.6  PLT 287   Basic Metabolic Panel: Recent Labs  Lab 01/25/19 1302  NA 137  K 3.1*  CL 102  CO2 27  GLUCOSE 127*  BUN 18  CREATININE 1.09*  CALCIUM 9.4  MG 2.1   GFR: Estimated Creatinine Clearance: 46 mL/min (A) (by C-G formula based on SCr of 1.09 mg/dL (H)). Liver Function Tests: No results for input(s): AST, ALT, ALKPHOS, BILITOT, PROT, ALBUMIN in the last 168 hours. No results for input(s): LIPASE, AMYLASE in the last 168 hours. No results for input(s): AMMONIA in the last 168 hours. Coagulation Profile: No results for input(s): INR, PROTIME in the last 168 hours. Cardiac Enzymes: No results for input(s): CKTOTAL, CKMB, CKMBINDEX, TROPONINI in the last 168 hours. BNP (last 3 results) No results for input(s): PROBNP in the last 8760 hours. HbA1C: No results for input(s): HGBA1C in the last 72 hours. CBG: Recent Labs  Lab 01/25/19 2002 01/25/19 2127 01/26/19 0843  GLUCAP 78 90 177*   Lipid Profile: No results for input(s): CHOL, HDL, LDLCALC, TRIG, CHOLHDL, LDLDIRECT in the last 72 hours. Thyroid Function Tests: No results for input(s): TSH, T4TOTAL, FREET4, T3FREE, THYROIDAB in the last 72 hours. Anemia Panel: No results for input(s): VITAMINB12, FOLATE, FERRITIN, TIBC, IRON, RETICCTPCT in the last 72 hours.    Radiology Studies: I have reviewed all of the imaging during this hospital visit personally     Scheduled Meds: . aspirin  81 mg Oral Daily  . cholecalciferol  1,000 Units Oral Daily  . dorzolamide  1 drop Both Eyes TID  . enoxaparin (LOVENOX) injection  40 mg Subcutaneous Q24H  .  famotidine  20 mg Oral BID  . gabapentin  300 mg Oral TID  . hydrochlorothiazide  25 mg Oral Daily  . levETIRAcetam  750 mg Oral BID  . liraglutide  1.8 mg Subcutaneous Daily  . losartan  50 mg Oral Daily  . propranolol  40 mg Oral BID  . sodium chloride flush  3 mL Intravenous Once  . topiramate  200 mg Oral BID   Continuous Infusions:   LOS: 0 days         Gerome Apley, MD

## 2019-01-26 NOTE — Consult Note (Addendum)
CARDIOLOGY CONSULT NOTE  Patient ID: [name] [name] MRN: [id number] DOB/AGE: 1946-05-24 73 y.o.  Admit date: 01/25/2019 Referring Physician  Jennette Kettle, DO Primary Physician:  Benito Mccreedy, MD Reason for Consultation  Chest pain  HPI:    [name] [name]  is a 73 y.o. female  with African-American female with diabetes, hypertension, obstructive sleep apnea on CPAP presenting to the emergency room with started described as tightness to sharp pain in the middle of the chest, radiates to her left  On further questioning chest pain is described as continuous and present for the past 2 weeks.  She ruled out for myocardial infarction by high sensitive serum troponin.  This morning she continues to have chest pain, morning she was having 9 out of 10 chest pain but improved with morphine sulfate.  She also complains of shortness of breath.  No hemoptysis no leg edema, no recent travel denies fever cough, PND or orthopnea.  Past Medical History:  Diagnosis Date  . Arthritis    "all over my body" (12/28/2012)  . Chronic bronchitis ([place])    "get it q yr" (12/28/2012)  . Chronic lower back pain   . Enlarged heart   . GERD (gastroesophageal reflux disease)   . [place] mal seizure [facility]) ~ 1962   "last sz was 08/2012; long time before that" (12/28/2012)  . YPPJKDTO(671.2)    "maybe 2/month" (12/28/2012)  . Hypertension   . OSA on CPAP   . Type II diabetes mellitus ([place])     Past Surgical History:  Procedure Laterality Date  . ABDOMINAL HYSTERECTOMY  1970's  . CATARACT EXTRACTION W/ INTRAOCULAR LENS  IMPLANT, BILATERAL Bilateral ~ 2012  . DILATION AND CURETTAGE OF UTERUS  1970's  . LEFT HEART CATHETERIZATION WITH CORONARY ANGIOGRAM N/A 03/02/2013   Procedure: LEFT HEART CATHETERIZATION WITH CORONARY ANGIOGRAM;  Surgeon: Laverda Page, MD;  Location: [facility] CATH LAB;  Service: Cardiovascular;  Laterality: N/A;    Social History   Socioeconomic History  . Marital status: Married    Spouse name:  Not on file  . Number of children: Not on file  . Years of education: Not on file  . Highest education level: Not on file  Occupational History  . Not on file  Social Needs  . Financial resource strain: Not on file  . Food insecurity    Worry: Not on file    Inability: Not on file  . Transportation needs    Medical: Not on file    Non-medical: Not on file  Tobacco Use  . Smoking status: Never Smoker  . Smokeless tobacco: Never Used  Substance and Sexual Activity  . Alcohol use: No  . Drug use: No  . Sexual activity: Not on file  Lifestyle  . Physical activity    Days per week: Not on file    Minutes per session: Not on file  . Stress: Not on file  Relationships  . Social Herbalist on phone: Not on file    Gets together: Not on file    Attends religious service: Not on file    Active member of club or organization: Not on file    Attends meetings of clubs or organizations: Not on file    Relationship status: Not on file  . Intimate partner violence    Fear of current or ex partner: Not on file    Emotionally abused: Not on file    Physically abused: Not on file    Forced  sexual activity: Not on file  Other Topics Concern  . Not on file  Social History Narrative  . Not on file    No current facility-administered medications on file prior to encounter.    Current Outpatient Medications on File Prior to Encounter  Medication Sig Dispense Refill  . albuterol (PROVENTIL HFA;VENTOLIN HFA) 108 (90 BASE) MCG/ACT inhaler Inhale 2 puffs into the lungs every 6 (six) hours as needed for wheezing.    Marland Kitchen aspirin 81 MG chewable tablet Chew 81 mg by mouth daily.    . cholecalciferol (VITAMIN D) 1000 UNITS tablet Take 1 tablet (1,000 Units total) by mouth daily.    . dorzolamide (TRUSOPT) 2 % ophthalmic solution Place 1 drop into both eyes 3 (three) times daily.    Marland Kitchen gabapentin (NEURONTIN) 300 MG capsule Take 300 mg by mouth 3 (three) times daily.    .  hydrochlorothiazide (HYDRODIURIL) 25 MG tablet Take 25 mg by mouth daily.      Marland Kitchen levETIRAcetam (KEPPRA) 750 MG tablet Take 1 tablet (750 mg total) by mouth 2 (two) times daily. Please call [phone] to schedule an appt for continued refills. (Patient taking differently: Take 750 mg by mouth 2 (two) times daily. ) 180 tablet 0  . Liraglutide (VICTOZA) 18 MG/3ML SOPN Inject 1.8 mg into the skin daily.     Marland Kitchen losartan (COZAAR) 50 MG tablet Take 50 mg by mouth daily.    . nitroGLYCERIN (NITROLINGUAL) 0.4 MG/SPRAY spray Place 1 spray under the tongue every 5 (five) minutes x 3 doses as needed for chest pain.    Marland Kitchen ondansetron (ZOFRAN ODT) 4 MG disintegrating tablet Take 1 tablet (4 mg total) by mouth every 8 (eight) hours as needed. (Patient taking differently: Take 4 mg by mouth every 8 (eight) hours as needed for nausea. ) 20 tablet 6  . propranolol (INDERAL) 40 MG tablet Take 1 tablet (40 mg total) by mouth 2 (two) times daily. 60 tablet 11  . ranitidine (ZANTAC) 150 MG tablet Take 150 mg by mouth 2 (two) times daily.      . SUMAtriptan (IMITREX) 25 MG tablet Take 1 tab at onset of migraine.  May repeat in 2 hrs, prn.  Max: 2 tab/24 hrs - 10 tabs/30 days. (Patient taking differently: Take 25 mg by mouth See admin instructions. Take 1 tab at onset of migraine.  May repeat in 2 hrs, prn.  Max: 2 tab/24 hrs - 10 tabs/30 days.) 30 tablet 3  . topiramate (TOPAMAX) 200 MG tablet TAKE 1 TABLET BY MOUTH TWO  TIMES DAILY (Patient taking differently: Take 200 mg by mouth 2 (two) times daily. Take 1 tablet by mouth two  times daily) 180 tablet 3  . traMADol (ULTRAM) 50 MG tablet Take 1 tablet (50 mg total) by mouth every 6 (six) hours as needed. (Patient taking differently: Take 50 mg by mouth every 6 (six) hours as needed for moderate pain. ) 60 tablet 5    ROS  Review of Systems  Constitutional: Positive for malaise/fatigue. Negative for weight loss.  Respiratory: Positive for shortness of breath (mild). Negative  for cough and hemoptysis.   Cardiovascular: Positive for chest pain. Negative for palpitations, claudication, leg swelling and PND.  Gastrointestinal: Negative for abdominal pain, blood in stool, constipation, heartburn and vomiting.  Genitourinary: Negative for dysuria.  Musculoskeletal: Negative for joint pain and myalgias.  Neurological: Negative for dizziness, focal weakness and headaches.  Endo/Heme/Allergies: Does not bruise/bleed easily.  Psychiatric/Behavioral: Negative for depression. The patient  is not nervous/anxious.   All other systems reviewed and are negative.  Objective  Blood pressure 127/71, pulse 71, temperature 98 F (36.7 C), temperature source Oral, resp. rate 19, height 5' (1.524 m), weight 85.8 kg, SpO2 97 %. Body mass index is 36.94 kg/m.  Physical Exam  Constitutional: No distress.  Moderately built and moderately obese  HENT:  Head: Atraumatic.  Eyes: Conjunctivae are normal.  Neck: Neck supple. No JVD present. No thyromegaly present.  Cardiovascular: Normal rate, regular rhythm, normal heart sounds, intact distal pulses and normal pulses. Exam reveals no gallop.  No murmur heard. Pulses:      Carotid pulses are 2+ on the right side and 2+ on the left side. No JVD, no leg edema.  Vascular examination was unremarkable.  Pulmonary/Chest: Effort normal and breath sounds normal.  Abdominal: Soft. Bowel sounds are normal.  Musculoskeletal: Normal range of motion.  Neurological: She is alert.  Skin: Skin is warm and dry.  Psychiatric: She has a normal mood and affect.    Radiology   Dg Chest 2 View  Result Date: 01/25/2019 CLINICAL DATA:  Acute chest pain for 1 week. EXAM: CHEST - 2 VIEW COMPARISON:  03/12/2016 and prior radiographs FINDINGS: Cardiomegaly noted. There is no evidence of focal airspace disease, pulmonary edema, suspicious pulmonary nodule/mass, pleural effusion, or pneumothorax. No acute bony abnormalities are identified. IMPRESSION:  Cardiomegaly without evidence of acute cardiopulmonary disease. Electronically Signed   By: Margarette Canada M.D.   On: 01/25/2019 12:46    Laboratory Examination   CMP Latest Ref Rng & Units 01/25/2019 01/09/2016 06/21/2015  Glucose 70 - 99 mg/dL 127(H) 128(H) 180(H)  BUN 8 - 23 mg/dL 18 16 17   Creatinine 0.44 - 1.00 mg/dL 1.09(H) 0.89 1.06(H)  Sodium 135 - 145 mmol/L 137 139 138  Potassium 3.5 - 5.1 mmol/L 3.1(L) 3.1(L) 4.4  Chloride 98 - 111 mmol/L 102 102 103  CO2 22 - 32 mmol/L 27 30 30   Calcium 8.9 - 10.3 mg/dL 9.4 8.8(L) 9.1  Total Protein 6.0 - 8.3 g/dL - - -  Total Bilirubin 0.3 - 1.2 mg/dL - - -  Alkaline Phos 39 - 117 U/L - - -  AST 0 - 37 U/L - - -  ALT 0 - 35 U/L - - -   CBC Latest Ref Rng & Units 01/25/2019 01/09/2016 06/21/2015  WBC 4.0 - 10.5 K/uL 9.3 9.7 11.0(H)  Hemoglobin 12.0 - 15.0 g/dL 12.4 12.4 13.0  Hematocrit 36.0 - 46.0 % 39.3 37.9 40.1  Platelets 150 - 400 K/uL 246 258 253   Lipid Panel     Component Value Date/Time   CHOL 157 12/28/2012 1623   TRIG 121 12/28/2012 1623   HDL 63 12/28/2012 1623   CHOLHDL 2.5 12/28/2012 1623   VLDL 24 12/28/2012 1623   LDLCALC 70 12/28/2012 1623   HEMOGLOBIN A1C Lab Results  Component Value Date   HGBA1C 7.7 (H) 12/28/2012   MPG 174 (H) 12/28/2012   TSH No results for input(s): TSH in the last 8760 hours. Cardiac Panel (last 3 results)  Medications:  Scheduled Meds: . aspirin  81 mg Oral Daily  . cholecalciferol  1,000 Units Oral Daily  . dorzolamide  1 drop Both Eyes TID  . enoxaparin (LOVENOX) injection  40 mg Subcutaneous Q24H  . famotidine  20 mg Oral BID  . gabapentin  300 mg Oral TID  . hydrochlorothiazide  25 mg Oral Daily  . levETIRAcetam  750 mg Oral BID  .  liraglutide  1.8 mg Subcutaneous Daily  . losartan  50 mg Oral Daily  . propranolol  40 mg Oral BID  . sodium chloride flush  3 mL Intravenous Once  . topiramate  200 mg Oral BID   Continuous Infusions: PRN Meds:.acetaminophen, albuterol, morphine  injection, ondansetron (ZOFRAN) IV Medications Discontinued During This Encounter  Medication Reason  . potassium chloride 10 mEq in 100 mL IVPB   . potassium chloride 10 mEq in 100 mL IVPB   . azithromycin (ZITHROMAX) 250 MG tablet Completed Course  . predniSONE (DELTASONE) 20 MG tablet Completed Course  . Insulin Detemir (LEVEMIR FLEXTOUCH) 100 UNIT/ML Pen Patient Preference   Current Meds  Medication Sig  . albuterol (PROVENTIL HFA;VENTOLIN HFA) 108 (90 BASE) MCG/ACT inhaler Inhale 2 puffs into the lungs every 6 (six) hours as needed for wheezing.  Marland Kitchen aspirin 81 MG chewable tablet Chew 81 mg by mouth daily.  . cholecalciferol (VITAMIN D) 1000 UNITS tablet Take 1 tablet (1,000 Units total) by mouth daily.  . dorzolamide (TRUSOPT) 2 % ophthalmic solution Place 1 drop into both eyes 3 (three) times daily.  Marland Kitchen gabapentin (NEURONTIN) 300 MG capsule Take 300 mg by mouth 3 (three) times daily.  . hydrochlorothiazide (HYDRODIURIL) 25 MG tablet Take 25 mg by mouth daily.    Marland Kitchen levETIRAcetam (KEPPRA) 750 MG tablet Take 1 tablet (750 mg total) by mouth 2 (two) times daily. Please call [phone] to schedule an appt for continued refills. (Patient taking differently: Take 750 mg by mouth 2 (two) times daily. )  . Liraglutide (VICTOZA) 18 MG/3ML SOPN Inject 1.8 mg into the skin daily.   Marland Kitchen losartan (COZAAR) 50 MG tablet Take 50 mg by mouth daily.  . nitroGLYCERIN (NITROLINGUAL) 0.4 MG/SPRAY spray Place 1 spray under the tongue every 5 (five) minutes x 3 doses as needed for chest pain.  Marland Kitchen ondansetron (ZOFRAN ODT) 4 MG disintegrating tablet Take 1 tablet (4 mg total) by mouth every 8 (eight) hours as needed. (Patient taking differently: Take 4 mg by mouth every 8 (eight) hours as needed for nausea. )  . propranolol (INDERAL) 40 MG tablet Take 1 tablet (40 mg total) by mouth 2 (two) times daily.  . ranitidine (ZANTAC) 150 MG tablet Take 150 mg by mouth 2 (two) times daily.    . SUMAtriptan (IMITREX) 25 MG  tablet Take 1 tab at onset of migraine.  May repeat in 2 hrs, prn.  Max: 2 tab/24 hrs - 10 tabs/30 days. (Patient taking differently: Take 25 mg by mouth See admin instructions. Take 1 tab at onset of migraine.  May repeat in 2 hrs, prn.  Max: 2 tab/24 hrs - 10 tabs/30 days.)  . topiramate (TOPAMAX) 200 MG tablet TAKE 1 TABLET BY MOUTH TWO  TIMES DAILY (Patient taking differently: Take 200 mg by mouth 2 (two) times daily. Take 1 tablet by mouth two  times daily)  . traMADol (ULTRAM) 50 MG tablet Take 1 tablet (50 mg total) by mouth every 6 (six) hours as needed. (Patient taking differently: Take 50 mg by mouth every 6 (six) hours as needed for moderate pain. )    Cardiac studies   Coronary Angiography 03/02/2013: Except for mild calcification and very minimal luminal irregularity in the mid LAD, normal coronary arteries, normal.  Assessment  1.  Musculoskeletal chest pain EKG 01/26/2019: Normal sinus rhythm with rate of 64 bpm, poor R wave progression, probably normal variant, cannot exclude anteroseptal infarct old.  No significant change from prior EKG.  2.  Diabetes mellitus type 2 uncontrolled without hyperglycemia and on insulin. 3.  Morbid obesity, BMI 37, in view of diabetes mellitus considered moderate. 4.  Essential hypertension  Plan:  Patient symptoms do not suggest ACS.  However she continues to have chest pain and because of the above-stated risk factors, we should at least proceed with performing a nuclear stress test.  If stress test is negative, she can be discharged home.   Adrian Prows, MD, [facility] 01/26/2019, 10:32 AM [facility] [place] Pager: [phone] Office: [phone] If no answer Cell [phone]

## 2019-01-27 ENCOUNTER — Encounter (HOSPITAL_COMMUNITY)
Admission: EM | Disposition: A | Discharge status: Home / Self Care | Payer: Self-pay | Source: Home / Self Care | Attending: Internal Medicine

## 2019-01-27 DIAGNOSIS — E109 Type 1 diabetes mellitus without complications: Secondary | ICD-10-CM

## 2019-01-27 HISTORY — PX: LEFT HEART CATH AND CORONARY ANGIOGRAPHY: CATH118249

## 2019-01-27 LAB — GLUCOSE, CAPILLARY
Glucose-Capillary: 147 mg/dL — ABNORMAL HIGH (ref 70–99)
Glucose-Capillary: 152 mg/dL — ABNORMAL HIGH (ref 70–99)
Glucose-Capillary: 79 mg/dL (ref 70–99)
Glucose-Capillary: 151 mg/dL — ABNORMAL HIGH (ref 70–99)

## 2019-01-27 LAB — BASIC METABOLIC PANEL
Anion gap: 6 (ref 5–15)
BUN: 20 mg/dL (ref 8–23)
CO2: 27 mmol/L (ref 22–32)
Calcium: 8.9 mg/dL (ref 8.9–10.3)
Chloride: 105 mmol/L (ref 98–111)
Creatinine, Ser: 1.25 mg/dL — ABNORMAL HIGH (ref 0.44–1.00)
GFR calc Af Amer: 50 mL/min — ABNORMAL LOW (ref >60)
GFR calc non Af Amer: 43 mL/min — ABNORMAL LOW (ref >60)
Glucose, Bld: 181 mg/dL — ABNORMAL HIGH (ref 70–99)
Potassium: 3.3 mmol/L — ABNORMAL LOW (ref 3.5–5.1)
Sodium: 138 mmol/L (ref 135–145)

## 2019-01-27 SURGERY — LEFT HEART CATH AND CORONARY ANGIOGRAPHY
Anesthesia: LOCAL

## 2019-01-27 MED ORDER — MIDAZOLAM HCL 2 MG/2ML IJ SOLN
INTRAMUSCULAR | Status: AC
  Filled 2019-01-27: qty 2

## 2019-01-27 MED ORDER — SODIUM CHLORIDE 0.9% FLUSH: 3.0000 mL | INTRAVENOUS | Status: DC | PRN

## 2019-01-27 MED ORDER — SODIUM CHLORIDE 0.9 % WEIGHT BASED INFUSION
3.0000 mL/kg/h | INTRAVENOUS | Status: AC
  Administered 2019-01-27: 3 mL/kg/h via INTRAVENOUS

## 2019-01-27 MED ORDER — VERAPAMIL HCL 2.5 MG/ML IV SOLN
INTRAVENOUS | Status: DC | PRN
  Administered 2019-01-27: 10 mL via INTRA_ARTERIAL

## 2019-01-27 MED ORDER — HEPARIN SODIUM (PORCINE) 1000 UNIT/ML IJ SOLN
INTRAMUSCULAR | Status: AC
  Filled 2019-01-27: qty 1

## 2019-01-27 MED ORDER — SODIUM CHLORIDE 0.9 % IV SOLN: 250.0000 mL | INTRAVENOUS | Status: DC | PRN

## 2019-01-27 MED ORDER — HYDRALAZINE HCL 20 MG/ML IJ SOLN: 10.0000 mg | INTRAMUSCULAR | Status: AC | PRN

## 2019-01-27 MED ORDER — HEPARIN SODIUM (PORCINE) 1000 UNIT/ML IJ SOLN
INTRAMUSCULAR | Status: DC | PRN
  Administered 2019-01-27: 5000 [IU] via INTRAVENOUS

## 2019-01-27 MED ORDER — SODIUM CHLORIDE 0.9% FLUSH
3.0000 mL | Freq: Two times a day (BID) | INTRAVENOUS | Status: DC
  Administered 2019-01-27 – 2019-01-28 (×3): 3 mL via INTRAVENOUS

## 2019-01-27 MED ORDER — ACETAMINOPHEN 325 MG PO TABS: 650.0000 mg | ORAL_TABLET | ORAL | Status: DC | PRN

## 2019-01-27 MED ORDER — LIDOCAINE HCL (PF) 1 % IJ SOLN
INTRAMUSCULAR | Status: DC | PRN
  Administered 2019-01-27: 2 mL

## 2019-01-27 MED ORDER — LIDOCAINE HCL (PF) 1 % IJ SOLN
INTRAMUSCULAR | Status: AC
  Filled 2019-01-27: qty 30

## 2019-01-27 MED ORDER — ONDANSETRON HCL 4 MG/2ML IJ SOLN: 4.0000 mg | Freq: Four times a day (QID) | INTRAMUSCULAR | Status: DC | PRN

## 2019-01-27 MED ORDER — POTASSIUM CHLORIDE CRYS ER 20 MEQ PO TBCR
40.0000 meq | EXTENDED_RELEASE_TABLET | Freq: Once | ORAL | Status: AC
  Administered 2019-01-27: 40 meq via ORAL
  Filled 2019-01-27: qty 2

## 2019-01-27 MED ORDER — FENTANYL CITRATE (PF) 100 MCG/2ML IJ SOLN
INTRAMUSCULAR | Status: DC | PRN
  Administered 2019-01-27: 50 ug via INTRAVENOUS

## 2019-01-27 MED ORDER — SODIUM CHLORIDE 0.9 % IV SOLN: INTRAVENOUS | Status: AC

## 2019-01-27 MED ORDER — FENTANYL CITRATE (PF) 100 MCG/2ML IJ SOLN
INTRAMUSCULAR | Status: AC
  Filled 2019-01-27: qty 2

## 2019-01-27 MED ORDER — LABETALOL HCL 5 MG/ML IV SOLN: 10.0000 mg | INTRAVENOUS | Status: AC | PRN

## 2019-01-27 MED ORDER — VERAPAMIL HCL 2.5 MG/ML IV SOLN
INTRAVENOUS | Status: AC
  Filled 2019-01-27: qty 2

## 2019-01-27 MED ORDER — SODIUM CHLORIDE 0.9 % WEIGHT BASED INFUSION: 1.0000 mL/kg/h | INTRAVENOUS | Status: DC

## 2019-01-27 MED ORDER — SODIUM CHLORIDE 0.9% FLUSH: 3.0000 mL | Freq: Two times a day (BID) | INTRAVENOUS | Status: DC

## 2019-01-27 MED ORDER — HEPARIN (PORCINE) IN NACL 1000-0.9 UT/500ML-% IV SOLN
INTRAVENOUS | Status: AC
  Filled 2019-01-27: qty 1000

## 2019-01-27 MED ORDER — HEPARIN (PORCINE) IN NACL 1000-0.9 UT/500ML-% IV SOLN
INTRAVENOUS | Status: DC | PRN
  Administered 2019-01-27 (×2): 500 mL

## 2019-01-27 MED ORDER — IOHEXOL 350 MG/ML SOLN
INTRAVENOUS | Status: DC | PRN
  Administered 2019-01-27: 40 mL via INTRACARDIAC

## 2019-01-27 MED ORDER — MIDAZOLAM HCL 2 MG/2ML IJ SOLN
INTRAMUSCULAR | Status: DC | PRN
  Administered 2019-01-27: 1 mg via INTRAVENOUS

## 2019-01-27 SURGICAL SUPPLY — 10 items
CATH 5FR JL3.5 JR4 ANG PIG MP (CATHETERS) ×1 IMPLANT
DEVICE RAD COMP TR BAND LRG (VASCULAR PRODUCTS) ×1 IMPLANT
GLIDESHEATH SLEND A-KIT 6F 22G (SHEATH) ×1 IMPLANT
GUIDEWIRE INQWIRE 1.5J.035X260 (WIRE) IMPLANT
INQWIRE 1.5J .035X260CM (WIRE) ×2
KIT HEART LEFT (KITS) ×2 IMPLANT
PACK CARDIAC CATHETERIZATION (CUSTOM PROCEDURE TRAY) ×2 IMPLANT
SYR MEDRAD MARK 7 150ML (SYRINGE) ×2 IMPLANT
TRANSDUCER W/STOPCOCK (MISCELLANEOUS) ×2 IMPLANT
TUBING CIL FLEX 10 FLL-RA (TUBING) ×2 IMPLANT

## 2019-01-27 NOTE — H&P (View-Only) (Signed)
Subjective:  Still continues to have chest pain but states that it is much better.  Intake/Output from previous day:  I/O last 3 completed shifts: In: 240 [P.O.:240] Out: 600 [Urine:600] No intake/output data recorded.  Blood pressure (!) 93/53, pulse 65, temperature 97.7 F (36.5 C), temperature source Oral, resp. rate 19, height 5' (1.524 m), weight 85.5 kg, SpO2 100 %. Physical Exam  Constitutional: No distress.  Short statured and moderately obese.    HENT:  Head: Atraumatic.  Eyes: Conjunctivae are normal.  Neck: Neck supple. No JVD present. No thyromegaly present.  Cardiovascular: Normal rate, regular rhythm, normal heart sounds and intact distal pulses. Exam reveals no gallop.  No murmur heard. Pulmonary/Chest: Effort normal and breath sounds normal.  Chest wall tenderness present anteriorly.  Abdominal: Soft. Bowel sounds are normal.  Musculoskeletal: Normal range of motion.  Neurological: She is alert.  Skin: Skin is warm and dry.  Psychiatric: She has a normal mood and affect.    Lab Results: BMP BNP (last 3 results) Recent Labs    01/25/19 1302  BNP 44.9    ProBNP (last 3 results) No results for input(s): PROBNP in the last 8760 hours. BMP Latest Ref Rng & Units 01/27/2019 01/25/2019 01/09/2016  Glucose 70 - 99 mg/dL 181(H) 127(H) 128(H)  BUN 8 - 23 mg/dL 20 18 16   Creatinine 0.44 - 1.00 mg/dL 1.25(H) 1.09(H) 0.89  Sodium 135 - 145 mmol/L 138 137 139  Potassium 3.5 - 5.1 mmol/L 3.3(L) 3.1(L) 3.1(L)  Chloride 98 - 111 mmol/L 105 102 102  CO2 22 - 32 mmol/L 27 27 30   Calcium 8.9 - 10.3 mg/dL 8.9 9.4 8.8(L)   Hepatic Function Latest Ref Rng & Units 02/17/2014 12/28/2012 12/28/2012  Total Protein 6.0 - 8.3 g/dL 7.6 6.9 7.4  Albumin 3.5 - 5.2 g/dL 3.5 3.3(L) 3.6  AST 0 - 37 U/L 18 15 15   ALT 0 - 35 U/L 13 11 12   Alk Phosphatase 39 - 117 U/L 94 74 80  Total Bilirubin 0.3 - 1.2 mg/dL <0.2(L) 0.2(L) 0.3   CBC Latest Ref Rng & Units 01/25/2019 01/09/2016 06/21/2015   WBC 4.0 - 10.5 K/uL 9.3 9.7 11.0(H)  Hemoglobin 12.0 - 15.0 g/dL 12.4 12.4 13.0  Hematocrit 36.0 - 46.0 % 39.3 37.9 40.1  Platelets 150 - 400 K/uL 246 258 253   Lipid Panel     Component Value Date/Time   CHOL 157 12/28/2012 1623   TRIG 121 12/28/2012 1623   HDL 63 12/28/2012 1623   CHOLHDL 2.5 12/28/2012 1623   VLDL 24 12/28/2012 1623   LDLCALC 70 12/28/2012 1623   Cardiac Panel (last 3 results) No results for input(s): CKTOTAL, CKMB, TROPONINI, RELINDX in the last 72 hours.  HEMOGLOBIN A1C Lab Results  Component Value Date   HGBA1C 6.5 (H) 01/26/2019   MPG 139.85 01/26/2019   TSH No results for input(s): TSH in the last 8760 hours. Imaging: Imaging results have been reviewed Scheduled Meds: . acetaminophen  650 mg Oral Q6H  . aspirin  81 mg Oral Daily  . cholecalciferol  1,000 Units Oral Daily  . diclofenac sodium  2 g Topical QID  . dorzolamide  1 drop Both Eyes TID  . enoxaparin (LOVENOX) injection  40 mg Subcutaneous Q24H  . famotidine  20 mg Oral BID  . gabapentin  300 mg Oral TID  . insulin aspart  0-9 Units Subcutaneous TID WC  . levETIRAcetam  750 mg Oral BID  . losartan  50 mg  Oral Daily  . nitroGLYCERIN  0.2 mg Transdermal Daily  . potassium chloride  40 mEq Oral Once  . propranolol  40 mg Oral BID  . sodium chloride flush  3 mL Intravenous Once  . topiramate  200 mg Oral BID   Continuous Infusions: PRN Meds:.albuterol, morphine injection, ondansetron (ZOFRAN) IV, oxyCODONE-acetaminophen   Cardiac studies:  Coronary Angiography 03/02/2013: Except for mild calcification and very minimal luminal irregularity in the mid LAD, normal coronary arteries, normal.  Lexiscan Myoview stress test 01/26/2019: Perfusion: Multiple reversible myocardial perfusion defects are seen throughout the left ventricle involving the anteroseptal, lateral, and inferior walls. This is consistent with inducible three-vessel myocardial ischemia.  Wall Motion: Normal left  ventricular wall motion. No left ventricular dilation.  Left Ventricular Ejection Fraction: 74 %  End diastolic volume 47 ml  End systolic volume 12 ml  IMPRESSION: 1. Multiple reversible perfusion defects involving the anteroseptal, lateral, and inferior walls, consistent with inducible myocardial ischemia.  2. Normal left ventricular wall motion. Assessment/Plan:  1.  Atypical chest pain with negative high sensitive serum troponin and normal EKG and reproducible chest pain. 2.  High risk abnormal nuclear stress test revealing multiple territory ischemia, suspect these are artifacts. 3.  Essential hypertension, controlled 4.  Morbid obesity in view of diabetes mellitus and a BMI of 37.  Recommendation: Although patient symptoms are very atypical, in view of underlying risk factors and abnormal nuclear stress test, and ongoing chest pain, I will proceed with cardiac catheterization. Discussed risks, benefits and alternatives of angiogram including but not limited to <1% risk of death, stroke, MI, need for urgent surgical revascularization, renal failure, but not limited to thest. patient is willing to proceed.   Adrian Prows, M.D. 01/27/2019, 8:28 AM [facility] [place] Pager: [phone] Office: [phone] If no answer: [phone]

## 2019-01-27 NOTE — Discharge Summary (Addendum)
Physician Discharge Summary  [name] [name] STM:[id number] DOB: 12-Feb-1946 DOA: 01/25/2019  PCP: Benito Mccreedy, MD  Admit date: 01/25/2019 Discharge date: 01/28/2019  Admitted From: home Disposition:  home   Recommendations for Outpatient Follow-up:  1. F/u on chest pain which appears to be mainly musculoskeletal    Discharge Condition:  stable   CODE STATUS:  Full code   Diet recommendation:  Heart healthy and diabetic Consultations:  cardiology    Discharge Diagnoses:  Principal Problem:   Chest pain Active Problems:   Seizure disorder (HCC)   HTN (hypertension)   OSA on CPAP   Type 1 diabetes mellitus without complication (HCC)    Subjective: She states she continues to have left sided chest pain on and off   Brief Summary: 73 year old female who presented with chest pain.  She does have significant past medical history for type 2 diabetes mellitus, hypertension and obstructive sleep apnea.  Patient reported sharp precordial chest pain, not exertional related, relieved by nitroglycerin.  Ongoing symptoms for about a week.  On her initial physical examination blood pressure 122/72, heart rate 52, respiratory rate 10, temperature 98.2, oxygen saturation is 7%, her lungs are clear to auscultation bilaterally, heart S1-S2 present rhythmic, abdomen soft, no lower extremity edema. Sodium 137, potassium 3.1, chloride 102, bicarb 27, glucose 127, BUN 17, creatinine 1.0, magnesium 2.1, troponin 5, white count 9.3, hemoglobin 12.4, hematocrit 39.3, platelets 246.  SARS COVID-19 was negative.  Chest x-ray negative for infiltrates.  EKG 73 bpm, normal axis, normal intervals, sinus rhythm, no ST segment or T wave changes.  Patient was admitted to the hospital working diagnosis of atypical chest pain, rule out acute coronary syndrome   Hospital Course:   Chest pain - noted to have pain at rest which was atypica and very reproducible on exam - evaluated by cardiology and underwent  stress test which was positive and showed reversible ischemia, she continued to have on and off pain at rest -she underwent a cardiac cath and this did not show any stenosis - the cardiology note mentions that her pain is noncardiac- I feel it is muscular- Voltaren gel orderd by my colleague should be continued at home - 2 D ECHO shows impaired relaxation of LV- see below  DM2 - with neuropathy - cont home meds  HTN - cont Losartan, Propanolol and HCTZ  Seizure disorder - cont Keppra and Topiramate  Morbid obesity Body mass index is 36.86 kg/m.  - needs to lose weight  OSA - cont CPAP     Discharge Exam: Vitals:   01/27/19 2203 01/28/19 0438  BP:  (!) 96/50  Pulse: 65 68  Resp:    Temp:  98 F (36.7 C)  SpO2:  99%   Vitals:   01/27/19 2200 01/27/19 2203 01/28/19 0438 01/28/19 0617  BP: (!) 105/58  (!) 96/50   Pulse: 64 65 68   Resp:      Temp:   98 F (36.7 C)   TempSrc:   Oral   SpO2: 97%  99%   Weight:    85.6 kg  Height:        General: Pt is alert, awake, not in acute distress Cardiovascular: RRR, S1/S2 +, no rubs, no gallops Chest : tender in left chest wall Respiratory: CTA bilaterally, no wheezing, no rhonchi Abdominal: Soft, NT, ND, bowel sounds + Extremities: no edema, no cyanosis   Discharge Instructions  Discharge Instructions    Diet - low sodium heart healthy   Complete  by: As directed    Diet Carb Modified   Complete by: As directed    Increase activity slowly   Complete by: As directed      Allergies as of 01/28/2019   No Known Allergies     Medication List    TAKE these medications   albuterol 108 (90 Base) MCG/ACT inhaler Commonly known as: VENTOLIN HFA Inhale 2 puffs into the lungs every 6 (six) hours as needed for wheezing.   aspirin 81 MG chewable tablet Chew 81 mg by mouth daily.   cholecalciferol 25 MCG (1000 UT) tablet Commonly known as: VITAMIN D Take 1 tablet (1,000 Units total) by mouth daily.   diclofenac  sodium 1 % Gel Commonly known as: VOLTAREN Apply 2 g topically 4 (four) times daily. Apply to tender areas on chest wall 4 x day   dorzolamide 2 % ophthalmic solution Commonly known as: TRUSOPT Place 1 drop into both eyes 3 (three) times daily.   gabapentin 300 MG capsule Commonly known as: NEURONTIN Take 300 mg by mouth 3 (three) times daily.   hydrochlorothiazide 25 MG tablet Commonly known as: HYDRODIURIL Take 25 mg by mouth daily.   levETIRAcetam 750 MG tablet Commonly known as: KEPPRA Take 1 tablet (750 mg total) by mouth 2 (two) times daily. Please call [phone] to schedule an appt for continued refills. What changed: additional instructions   losartan 50 MG tablet Commonly known as: COZAAR Take 50 mg by mouth daily.   nitroGLYCERIN 0.4 MG/SPRAY spray Commonly known as: NITROLINGUAL Place 1 spray under the tongue every 5 (five) minutes x 3 doses as needed for chest pain.   ondansetron 4 MG disintegrating tablet Commonly known as: Zofran ODT Take 1 tablet (4 mg total) by mouth every 8 (eight) hours as needed. What changed: reasons to take this   propranolol 40 MG tablet Commonly known as: INDERAL Take 1 tablet (40 mg total) by mouth 2 (two) times daily.   ranitidine 150 MG tablet Commonly known as: ZANTAC Take 150 mg by mouth 2 (two) times daily.   SUMAtriptan 25 MG tablet Commonly known as: Imitrex Take 1 tab at onset of migraine.  May repeat in 2 hrs, prn.  Max: 2 tab/24 hrs - 10 tabs/30 days. What changed:   how much to take  how to take this  when to take this   topiramate 200 MG tablet Commonly known as: TOPAMAX TAKE 1 TABLET BY MOUTH TWO  TIMES DAILY What changed: additional instructions   traMADol 50 MG tablet Commonly known as: ULTRAM Take 1 tablet (50 mg total) by mouth every 6 (six) hours as needed. What changed: reasons to take this   Victoza 18 MG/3ML Sopn Generic drug: liraglutide Inject 1.8 mg into the skin daily.      Follow-up  Information    Adrian Prows, MD Follow up.   Specialty: Cardiology Why: 2:00 PM Contact information: [place] [id number] [phone]          No Known Allergies   Procedures/Studies:  Stress test Cardiac cath 2 D ECHO . The left ventricle has normal systolic function with an ejection fraction of 60-65%. The cavity size was normal. There is mildly increased left ventricular wall thickness. Left ventricular diastolic Doppler parameters are consistent with impaired  relaxation.  2. The right ventricle has normal systolic function. The cavity was normal. There is no increase in right ventricular wall thickness.  3. Trace aortic and mitral regurgitation.  4. Mild tricuspid regurgitation. No evidence of pulmonary hypertension.   Dg Chest 2 View  Result Date: 01/25/2019 CLINICAL DATA:  Acute chest pain for 1 week. EXAM: CHEST - 2 VIEW COMPARISON:  03/12/2016 and prior radiographs FINDINGS: Cardiomegaly noted. There is no evidence of focal airspace disease, pulmonary edema, suspicious pulmonary nodule/mass, pleural effusion, or pneumothorax. No acute bony abnormalities are identified. IMPRESSION: Cardiomegaly without evidence of acute cardiopulmonary disease. Electronically Signed   By: Margarette Canada M.D.   On: 01/25/2019 12:46   Nm Myocar Multi W/spect W/wall Motion / Ef  Result Date: 01/26/2019 CLINICAL DATA:  New onset chest pain.  Diabetes and hypertension. EXAM: MYOCARDIAL IMAGING WITH SPECT (REST AND PHARMACOLOGIC-STRESS) GATED LEFT VENTRICULAR WALL MOTION STUDY LEFT VENTRICULAR EJECTION FRACTION TECHNIQUE: Standard myocardial SPECT imaging was performed after resting intravenous injection of 10 mCi Tc-76m tetrofosmin. Subsequently, intravenous infusion of Lexiscan was performed under the supervision of the Cardiology staff. At peak effect of the drug, 30 mCi Tc-59m tetrofosmin was injected intravenously and standard myocardial SPECT imaging was performed.  Quantitative gated imaging was also performed to evaluate left ventricular wall motion, and estimate left ventricular ejection fraction. COMPARISON:  None. FINDINGS: Perfusion: Multiple reversible myocardial perfusion defects are seen throughout the left ventricle involving the anteroseptal, lateral, and inferior walls. This is consistent with inducible three-vessel myocardial ischemia. Wall Motion: Normal left ventricular wall motion. No left ventricular dilation. Left Ventricular Ejection Fraction: 74 % End diastolic volume 47 ml End systolic volume 12 ml IMPRESSION: 1. Multiple reversible perfusion defects involving the anteroseptal, lateral, and inferior walls, consistent with inducible myocardial ischemia. 2. Normal left ventricular wall motion. 3. Left ventricular ejection fraction 74% 4. Non invasive risk stratification*: High *2012 Appropriate Use Criteria for Coronary Revascularization Focused Update: J Am Coll Cardiol. 1751;02(5):852-778. http://content.airportbarriers.com.aspx?articleid=[email or web address] Electronically Signed   By: Marlaine Hind M.D.   On: 01/26/2019 14:09     The results of significant diagnostics from this hospitalization (including imaging, microbiology, ancillary and laboratory) are listed below for reference.     Microbiology: Recent Results (from the past 240 hour(s))  SARS Coronavirus 2 [facility] order, Performed in [facility] hospital lab) Nasopharyngeal Nasopharyngeal Swab     Status: None   Collection Time: 01/25/19  3:15 PM   Specimen: Nasopharyngeal Swab  Result Value Ref Range Status   SARS Coronavirus 2 NEGATIVE NEGATIVE Final    Comment: (NOTE) If result is NEGATIVE SARS-CoV-2 target nucleic acids are NOT DETECTED. The SARS-CoV-2 RNA is generally detectable in upper and lower  respiratory specimens during the acute phase of infection. The lowest  concentration of SARS-CoV-2 viral copies this assay can detect is 250  copies / mL. A negative result does not  preclude SARS-CoV-2 infection  and should not be used as the sole basis for treatment or other  patient management decisions.  A negative result may occur with  improper specimen collection / handling, submission of specimen other  than nasopharyngeal swab, presence of viral mutation(s) within the  areas targeted by this assay, and inadequate number of viral copies  (<250 copies / mL). A negative result must be combined with clinical  observations, patient history, and epidemiological information. If result is POSITIVE SARS-CoV-2 target nucleic acids are DETECTED. The SARS-CoV-2 RNA is generally detectable in upper and lower  respiratory specimens dur ing the acute phase of infection.  Positive  results are indicative of active infection with SARS-CoV-2.  Clinical  correlation with patient history and other diagnostic information is  necessary to  determine patient infection status.  Positive results do  not rule out bacterial infection or co-infection with other viruses. If result is PRESUMPTIVE POSTIVE SARS-CoV-2 nucleic acids MAY BE PRESENT.   A presumptive positive result was obtained on the submitted specimen  and confirmed on repeat testing.  While 2019 novel coronavirus  (SARS-CoV-2) nucleic acids may be present in the submitted sample  additional confirmatory testing may be necessary for epidemiological  and / or clinical management purposes  to differentiate between  SARS-CoV-2 and other Sarbecovirus currently known to infect humans.  If clinically indicated additional testing with an alternate test  methodology [phone]) is advised. The SARS-CoV-2 RNA is generally  detectable in upper and lower respiratory sp ecimens during the acute  phase of infection. The expected result is Negative. Fact Sheet for Patients:  StrictlyIdeas.no Fact Sheet for Healthcare Providers: BankingDealers.co.za This test is not yet approved or cleared by  the [place] FDA and has been authorized for detection and/or diagnosis of SARS-CoV-2 by FDA under an Emergency Use Authorization (EUA).  This EUA will remain in effect (meaning this test can be used) for the duration of the COVID-19 declaration under Section 564(b)(1) of the Act, 21 U.S.C. section 360bbb-3(b)(1), unless the authorization is terminated or revoked sooner. Performed at [facility], [address] [place], [place] [address]      Labs: BNP (last 3 results) Recent Labs    01/25/19 1302  BNP 42.7   Basic Metabolic Panel: Recent Labs  Lab 01/25/19 1302 01/27/19 0330 01/28/19 0404  NA 137 138 138  K 3.1* 3.3* 3.6  CL 102 105 106  CO2 27 27 24   GLUCOSE 127* 181* 126*  BUN 18 20 18   CREATININE 1.09* 1.25* 0.99  CALCIUM 9.4 8.9 8.9  MG 2.1  --   --    Liver Function Tests: No results for input(s): AST, ALT, ALKPHOS, BILITOT, PROT, ALBUMIN in the last 168 hours. No results for input(s): LIPASE, AMYLASE in the last 168 hours. No results for input(s): AMMONIA in the last 168 hours. CBC: Recent Labs  Lab 01/25/19 1302  WBC 9.3  HGB 12.4  HCT 39.3  MCV 91.6  PLT 246   Cardiac Enzymes: No results for input(s): CKTOTAL, CKMB, CKMBINDEX, TROPONINI in the last 168 hours. BNP: Invalid input(s): POCBNP CBG: Recent Labs  Lab 01/27/19 0853 01/27/19 1121 01/27/19 1517 01/27/19 2214 01/28/19 0806  GLUCAP 147* 152* 79 151* 135*   D-Dimer No results for input(s): DDIMER in the last 72 hours. Hgb A1c Recent Labs    01/26/19 1536  HGBA1C 6.5*   Lipid Profile No results for input(s): CHOL, HDL, LDLCALC, TRIG, CHOLHDL, LDLDIRECT in the last 72 hours. Thyroid function studies No results for input(s): TSH, T4TOTAL, T3FREE, THYROIDAB in the last 72 hours.  Invalid input(s): FREET3 Anemia work up No results for input(s): VITAMINB12, FOLATE, FERRITIN, TIBC, IRON, RETICCTPCT in the last 72 hours. Urinalysis    Component Value Date/Time    COLORURINE YELLOW 06/21/2015 2245   APPEARANCEUR CLEAR 06/21/2015 2245   LABSPEC 1.013 06/21/2015 2245   PHURINE 7.0 06/21/2015 2245   GLUCOSEU NEGATIVE 06/21/2015 2245   HGBUR NEGATIVE 06/21/2015 2245   BILIRUBINUR NEGATIVE 06/21/2015 2245   KETONESUR NEGATIVE 06/21/2015 2245   PROTEINUR NEGATIVE 06/21/2015 2245   UROBILINOGEN 0.2 06/18/2011 1750   NITRITE NEGATIVE 06/21/2015 2245   LEUKOCYTESUR NEGATIVE 06/21/2015 2245   Sepsis Labs Invalid input(s): PROCALCITONIN,  WBC,  LACTICIDVEN Microbiology Recent Results (from the past  240 hour(s))  SARS Coronavirus 2 [facility] order, Performed in [facility] hospital lab) Nasopharyngeal Nasopharyngeal Swab     Status: None   Collection Time: 01/25/19  3:15 PM   Specimen: Nasopharyngeal Swab  Result Value Ref Range Status   SARS Coronavirus 2 NEGATIVE NEGATIVE Final    Comment: (NOTE) If result is NEGATIVE SARS-CoV-2 target nucleic acids are NOT DETECTED. The SARS-CoV-2 RNA is generally detectable in upper and lower  respiratory specimens during the acute phase of infection. The lowest  concentration of SARS-CoV-2 viral copies this assay can detect is 250  copies / mL. A negative result does not preclude SARS-CoV-2 infection  and should not be used as the sole basis for treatment or other  patient management decisions.  A negative result may occur with  improper specimen collection / handling, submission of specimen other  than nasopharyngeal swab, presence of viral mutation(s) within the  areas targeted by this assay, and inadequate number of viral copies  (<250 copies / mL). A negative result must be combined with clinical  observations, patient history, and epidemiological information. If result is POSITIVE SARS-CoV-2 target nucleic acids are DETECTED. The SARS-CoV-2 RNA is generally detectable in upper and lower  respiratory specimens dur ing the acute phase of infection.  Positive  results are indicative of active infection with  SARS-CoV-2.  Clinical  correlation with patient history and other diagnostic information is  necessary to determine patient infection status.  Positive results do  not rule out bacterial infection or co-infection with other viruses. If result is PRESUMPTIVE POSTIVE SARS-CoV-2 nucleic acids MAY BE PRESENT.   A presumptive positive result was obtained on the submitted specimen  and confirmed on repeat testing.  While 2019 novel coronavirus  (SARS-CoV-2) nucleic acids may be present in the submitted sample  additional confirmatory testing may be necessary for epidemiological  and / or clinical management purposes  to differentiate between  SARS-CoV-2 and other Sarbecovirus currently known to infect humans.  If clinically indicated additional testing with an alternate test  methodology [phone]) is advised. The SARS-CoV-2 RNA is generally  detectable in upper and lower respiratory sp ecimens during the acute  phase of infection. The expected result is Negative. Fact Sheet for Patients:  StrictlyIdeas.no Fact Sheet for Healthcare Providers: BankingDealers.co.za This test is not yet approved or cleared by the [place] FDA and has been authorized for detection and/or diagnosis of SARS-CoV-2 by FDA under an Emergency Use Authorization (EUA).  This EUA will remain in effect (meaning this test can be used) for the duration of the COVID-19 declaration under Section 564(b)(1) of the Act, 21 U.S.C. section 360bbb-3(b)(1), unless the authorization is terminated or revoked sooner. Performed at [facility], [address] [place],  [address]      Time coordinating discharge in minutes: 55  SIGNED:   Debbe Odea, MD  [facility] 01/28/2019, 8:34 AM Pager   If 7PM-7AM, please contact night-coverage [email or web address] Password [email or web address]

## 2019-01-27 NOTE — Progress Notes (Signed)
PROGRESS NOTE    [name] [name]   AJO:[id number]  DOB: [birth year]  DOA: 01/25/2019 PCP: Benito Mccreedy, MD   Brief Narrative:  [name] [name] 73 year old female who presented with chest pain. She does have significant past medical history for type 2 diabetes mellitus, hypertension and obstructive sleep apnea. Patient reported sharp precordial chest pain, not exertional related, relieved by nitroglycerin. Ongoing symptoms for about a week. On her initial physical examination blood pressure 122/72, heart rate 52, respiratory rate 10, temperature 98.2, oxygen saturation is 7%, her lungs are clear to auscultation bilaterally, heart S1-S2 present rhythmic, abdomen soft, no lower extremity edema. Sodium 137, potassium 3.1, chloride 102, bicarb 27, glucose 127, BUN 17, creatinine 1.0, magnesium 2.1, troponin5, white count 9.3, hemoglobin 12.4, hematocrit 39.3, platelets 246.SARS COVID-19 was negative. Chest x-ray negative for infiltrates. EKG 73 bpm, normal axis, normal intervals, sinus rhythm, no ST segment or T wave changes.  Patient was admitted to the hospital working diagnosis of atypical chest pain, rule out acute coronary syndrome   Subjective: Still having chest pain intermittently. No other complaints.     Assessment & Plan:   Chest pain - noted to have pain at rest which was atypica and reproducible on exam - evaluated by cardiology and underwent stress test which was positive and showed reversible ischemia, she continued to have on and off pain at rest -she underwent a cardiac cath today and this did not show any stenosis-  - his note states " LV-diagonal micro fistulae not amenable to intervention.  I think her resting chest pain is more likely to be noncardiac." - she needs to f/u with dr Nadyne Coombes on 9/2  Mild elevated Cr level - hydrate overnight and follow Cr in AM- she has received a 500 cc bolus today in cath lab due to hypotension  DM2 - with neuropathy - cont home  meds  HTN - cont Losartan, Propanolol and HCTZ  Seizure disorder - cont Keppra and Topiramate  Morbid obesity Body mass index is 36.81 kg/m.  - needs to lose weight  OSA - cont CPAP  Time spent in minutes: 35  DVT prophylaxis:  Lovenox Code Status: Full code Family Communication:  Disposition Plan: home tomorrow after ECHO Consultants:   cardiology Procedures:   Stress test   Cardiac cath Antimicrobials:  Anti-infectives (From admission, onward)   None       Objective: Vitals:   01/27/19 1557 01/27/19 1602 01/27/19 1607 01/27/19 1635  BP: (!) 86/57 (!) 86/54 103/62 117/76  Pulse: 64 64 67   Resp: 17 17 (!) 8   Temp:      TempSrc:      SpO2: (!) 85% 100% (!) 0% 100%  Weight:      Height:        Intake/Output Summary (Last 24 hours) at 01/27/2019 1720 Last data filed at 01/27/2019 0640 Gross per 24 hour  Intake 240 ml  Output 500 ml  Net -260 ml   Filed Weights   01/25/19 1212 01/26/19 0700 01/27/19 0658  Weight: 93.9 kg 85.8 kg 85.5 kg    Examination: General exam: Appears comfortable  HEENT: PERRLA, oral mucosa moist, no sclera icterus or thrush Respiratory system: Clear to auscultation. Respiratory effort normal. Cardiovascular system: S1 & S2 heard, RRR.   Gastrointestinal system: Abdomen soft, non-tender, nondistended. Normal bowel sounds. Central nervous system: Alert and oriented. No focal neurological deficits. Extremities: No cyanosis, clubbing or edema Skin: No rashes or ulcers Psychiatry:  Mood & affect appropriate.  Data Reviewed: I have personally reviewed following labs and imaging studies  CBC: Recent Labs  Lab 01/25/19 1302  WBC 9.3  HGB 12.4  HCT 39.3  MCV 91.6  PLT 517   Basic Metabolic Panel: Recent Labs  Lab 01/25/19 1302 01/27/19 0330  NA 137 138  K 3.1* 3.3*  CL 102 105  CO2 27 27  GLUCOSE 127* 181*  BUN 18 20  CREATININE 1.09* 1.25*  CALCIUM 9.4 8.9  MG 2.1  --    GFR: Estimated Creatinine  Clearance: 40.1 mL/min (A) (by C-G formula based on SCr of 1.25 mg/dL (H)). Liver Function Tests: No results for input(s): AST, ALT, ALKPHOS, BILITOT, PROT, ALBUMIN in the last 168 hours. No results for input(s): LIPASE, AMYLASE in the last 168 hours. No results for input(s): AMMONIA in the last 168 hours. Coagulation Profile: No results for input(s): INR, PROTIME in the last 168 hours. Cardiac Enzymes: No results for input(s): CKTOTAL, CKMB, CKMBINDEX, TROPONINI in the last 168 hours. BNP (last 3 results) No results for input(s): PROBNP in the last 8760 hours. HbA1C: Recent Labs    01/26/19 1536  HGBA1C 6.5*   CBG: Recent Labs  Lab 01/26/19 1616 01/26/19 2222 01/27/19 0853 01/27/19 1121 01/27/19 1517  GLUCAP 138* 166* 147* 152* 79   Lipid Profile: No results for input(s): CHOL, HDL, LDLCALC, TRIG, CHOLHDL, LDLDIRECT in the last 72 hours. Thyroid Function Tests: No results for input(s): TSH, T4TOTAL, FREET4, T3FREE, THYROIDAB in the last 72 hours. Anemia Panel: No results for input(s): VITAMINB12, FOLATE, FERRITIN, TIBC, IRON, RETICCTPCT in the last 72 hours. Urine analysis:    Component Value Date/Time   COLORURINE YELLOW 06/21/2015 2245   APPEARANCEUR CLEAR 06/21/2015 2245   LABSPEC 1.013 06/21/2015 2245   PHURINE 7.0 06/21/2015 2245   GLUCOSEU NEGATIVE 06/21/2015 2245   HGBUR NEGATIVE 06/21/2015 2245   BILIRUBINUR NEGATIVE 06/21/2015 2245   KETONESUR NEGATIVE 06/21/2015 2245   PROTEINUR NEGATIVE 06/21/2015 2245   UROBILINOGEN 0.2 06/18/2011 1750   NITRITE NEGATIVE 06/21/2015 2245   LEUKOCYTESUR NEGATIVE 06/21/2015 2245   Sepsis Labs: @LABRCNTIP (procalcitonin:4,lacticidven:4) ) Recent Results (from the past 240 hour(s))  SARS Coronavirus 2 [facility] order, Performed in [facility] hospital lab) Nasopharyngeal Nasopharyngeal Swab     Status: None   Collection Time: 01/25/19  3:15 PM   Specimen: Nasopharyngeal Swab  Result Value Ref Range Status   SARS  Coronavirus 2 NEGATIVE NEGATIVE Final    Comment: (NOTE) If result is NEGATIVE SARS-CoV-2 target nucleic acids are NOT DETECTED. The SARS-CoV-2 RNA is generally detectable in upper and lower  respiratory specimens during the acute phase of infection. The lowest  concentration of SARS-CoV-2 viral copies this assay can detect is 250  copies / mL. A negative result does not preclude SARS-CoV-2 infection  and should not be used as the sole basis for treatment or other  patient management decisions.  A negative result may occur with  improper specimen collection / handling, submission of specimen other  than nasopharyngeal swab, presence of viral mutation(s) within the  areas targeted by this assay, and inadequate number of viral copies  (<250 copies / mL). A negative result must be combined with clinical  observations, patient history, and epidemiological information. If result is POSITIVE SARS-CoV-2 target nucleic acids are DETECTED. The SARS-CoV-2 RNA is generally detectable in upper and lower  respiratory specimens dur ing the acute phase of infection.  Positive  results are indicative of active infection with SARS-CoV-2.  Clinical  correlation with  patient history and other diagnostic information is  necessary to determine patient infection status.  Positive results do  not rule out bacterial infection or co-infection with other viruses. If result is PRESUMPTIVE POSTIVE SARS-CoV-2 nucleic acids MAY BE PRESENT.   A presumptive positive result was obtained on the submitted specimen  and confirmed on repeat testing.  While 2019 novel coronavirus  (SARS-CoV-2) nucleic acids may be present in the submitted sample  additional confirmatory testing may be necessary for epidemiological  and / or clinical management purposes  to differentiate between  SARS-CoV-2 and other Sarbecovirus currently known to infect humans.  If clinically indicated additional testing with an alternate test   methodology [id number]) is advised. The SARS-CoV-2 RNA is generally  detectable in upper and lower respiratory sp ecimens during the acute  phase of infection. The expected result is Negative. Fact Sheet for Patients:  StrictlyIdeas.no Fact Sheet for Healthcare Providers: BankingDealers.co.za This test is not yet approved or cleared by the [place] FDA and has been authorized for detection and/or diagnosis of SARS-CoV-2 by FDA under an Emergency Use Authorization (EUA).  This EUA will remain in effect (meaning this test can be used) for the duration of the COVID-19 declaration under Section 564(b)(1) of the Act, 21 U.S.C. section 360bbb-3(b)(1), unless the authorization is terminated or revoked sooner. Performed at [facility], [address], [place], [place] [address]          Radiology Studies: Nm Myocar Multi W/spect W/wall Motion / Ef  Result Date: 01/26/2019 CLINICAL DATA:  New onset chest pain.  Diabetes and hypertension. EXAM: MYOCARDIAL IMAGING WITH SPECT (REST AND PHARMACOLOGIC-STRESS) GATED LEFT VENTRICULAR WALL MOTION STUDY LEFT VENTRICULAR EJECTION FRACTION TECHNIQUE: Standard myocardial SPECT imaging was performed after resting intravenous injection of 10 mCi Tc-43m tetrofosmin. Subsequently, intravenous infusion of Lexiscan was performed under the supervision of the Cardiology staff. At peak effect of the drug, 30 mCi Tc-65m tetrofosmin was injected intravenously and standard myocardial SPECT imaging was performed. Quantitative gated imaging was also performed to evaluate left ventricular wall motion, and estimate left ventricular ejection fraction. COMPARISON:  None. FINDINGS: Perfusion: Multiple reversible myocardial perfusion defects are seen throughout the left ventricle involving the anteroseptal, lateral, and inferior walls. This is consistent with inducible three-vessel myocardial ischemia. Wall Motion:  Normal left ventricular wall motion. No left ventricular dilation. Left Ventricular Ejection Fraction: 74 % End diastolic volume 47 ml End systolic volume 12 ml IMPRESSION: 1. Multiple reversible perfusion defects involving the anteroseptal, lateral, and inferior walls, consistent with inducible myocardial ischemia. 2. Normal left ventricular wall motion. 3. Left ventricular ejection fraction 74% 4. Non invasive risk stratification*: High *2012 Appropriate Use Criteria for Coronary Revascularization Focused Update: J Am Coll Cardiol. 8127;51(7):001-749. http://content.airportbarriers.com.aspx?articleid=[email or web address] Electronically Signed   By: Marlaine Hind M.D.   On: 01/26/2019 14:09      Scheduled Meds:  acetaminophen  650 mg Oral Q6H   aspirin  81 mg Oral Daily   cholecalciferol  1,000 Units Oral Daily   diclofenac sodium  2 g Topical QID   dorzolamide  1 drop Both Eyes TID   enoxaparin (LOVENOX) injection  40 mg Subcutaneous Q24H   famotidine  20 mg Oral BID   gabapentin  300 mg Oral TID   insulin aspart  0-9 Units Subcutaneous TID WC   levETIRAcetam  750 mg Oral BID   losartan  50 mg Oral Daily   nitroGLYCERIN  0.2 mg Transdermal Daily   propranolol  40 mg Oral  BID   sodium chloride flush  3 mL Intravenous Once   sodium chloride flush  3 mL Intravenous Q12H   topiramate  200 mg Oral BID   Continuous Infusions:  sodium chloride     sodium chloride       LOS: 1 day      Debbe Odea, MD [facility] Pager: [email or web address] Password [email or web address] 01/27/2019, 5:20 PM

## 2019-01-27 NOTE — Progress Notes (Signed)
Subjective:  Still continues to have chest pain but states that it is much better.  Intake/Output from previous day:  I/O last 3 completed shifts: In: 240 [P.O.:240] Out: 600 [Urine:600] No intake/output data recorded.  Blood pressure (!) 93/53, pulse 65, temperature 97.7 F (36.5 C), temperature source Oral, resp. rate 19, height 5' (1.524 m), weight 85.5 kg, SpO2 100 %. Physical Exam  Constitutional: No distress.  Short statured and moderately obese.    HENT:  Head: Atraumatic.  Eyes: Conjunctivae are normal.  Neck: Neck supple. No JVD present. No thyromegaly present.  Cardiovascular: Normal rate, regular rhythm, normal heart sounds and intact distal pulses. Exam reveals no gallop.  No murmur heard. Pulmonary/Chest: Effort normal and breath sounds normal.  Chest wall tenderness present anteriorly.  Abdominal: Soft. Bowel sounds are normal.  Musculoskeletal: Normal range of motion.  Neurological: She is alert.  Skin: Skin is warm and dry.  Psychiatric: She has a normal mood and affect.    Lab Results: BMP BNP (last 3 results) Recent Labs    01/25/19 1302  BNP 44.9    ProBNP (last 3 results) No results for input(s): PROBNP in the last 8760 hours. BMP Latest Ref Rng & Units 01/27/2019 01/25/2019 01/09/2016  Glucose 70 - 99 mg/dL 181(H) 127(H) 128(H)  BUN 8 - 23 mg/dL 20 18 16   Creatinine 0.44 - 1.00 mg/dL 1.25(H) 1.09(H) 0.89  Sodium 135 - 145 mmol/L 138 137 139  Potassium 3.5 - 5.1 mmol/L 3.3(L) 3.1(L) 3.1(L)  Chloride 98 - 111 mmol/L 105 102 102  CO2 22 - 32 mmol/L 27 27 30   Calcium 8.9 - 10.3 mg/dL 8.9 9.4 8.8(L)   Hepatic Function Latest Ref Rng & Units 02/17/2014 12/28/2012 12/28/2012  Total Protein 6.0 - 8.3 g/dL 7.6 6.9 7.4  Albumin 3.5 - 5.2 g/dL 3.5 3.3(L) 3.6  AST 0 - 37 U/L 18 15 15   ALT 0 - 35 U/L 13 11 12   Alk Phosphatase 39 - 117 U/L 94 74 80  Total Bilirubin 0.3 - 1.2 mg/dL <0.2(L) 0.2(L) 0.3   CBC Latest Ref Rng & Units 01/25/2019 01/09/2016 06/21/2015   WBC 4.0 - 10.5 K/uL 9.3 9.7 11.0(H)  Hemoglobin 12.0 - 15.0 g/dL 12.4 12.4 13.0  Hematocrit 36.0 - 46.0 % 39.3 37.9 40.1  Platelets 150 - 400 K/uL 246 258 253   Lipid Panel     Component Value Date/Time   CHOL 157 12/28/2012 1623   TRIG 121 12/28/2012 1623   HDL 63 12/28/2012 1623   CHOLHDL 2.5 12/28/2012 1623   VLDL 24 12/28/2012 1623   LDLCALC 70 12/28/2012 1623   Cardiac Panel (last 3 results) No results for input(s): CKTOTAL, CKMB, TROPONINI, RELINDX in the last 72 hours.  HEMOGLOBIN A1C Lab Results  Component Value Date   HGBA1C 6.5 (H) 01/26/2019   MPG 139.85 01/26/2019   TSH No results for input(s): TSH in the last 8760 hours. Imaging: Imaging results have been reviewed Scheduled Meds: . acetaminophen  650 mg Oral Q6H  . aspirin  81 mg Oral Daily  . cholecalciferol  1,000 Units Oral Daily  . diclofenac sodium  2 g Topical QID  . dorzolamide  1 drop Both Eyes TID  . enoxaparin (LOVENOX) injection  40 mg Subcutaneous Q24H  . famotidine  20 mg Oral BID  . gabapentin  300 mg Oral TID  . insulin aspart  0-9 Units Subcutaneous TID WC  . levETIRAcetam  750 mg Oral BID  . losartan  50 mg  Oral Daily  . nitroGLYCERIN  0.2 mg Transdermal Daily  . potassium chloride  40 mEq Oral Once  . propranolol  40 mg Oral BID  . sodium chloride flush  3 mL Intravenous Once  . topiramate  200 mg Oral BID   Continuous Infusions: PRN Meds:.albuterol, morphine injection, ondansetron (ZOFRAN) IV, oxyCODONE-acetaminophen   Cardiac studies:  Coronary Angiography 03/02/2013: Except for mild calcification and very minimal luminal irregularity in the mid LAD, normal coronary arteries, normal.  Lexiscan Myoview stress test 01/26/2019: Perfusion: Multiple reversible myocardial perfusion defects are seen throughout the left ventricle involving the anteroseptal, lateral, and inferior walls. This is consistent with inducible three-vessel myocardial ischemia.  Wall Motion: Normal left  ventricular wall motion. No left ventricular dilation.  Left Ventricular Ejection Fraction: 74 %  End diastolic volume 47 ml  End systolic volume 12 ml  IMPRESSION: 1. Multiple reversible perfusion defects involving the anteroseptal, lateral, and inferior walls, consistent with inducible myocardial ischemia.  2. Normal left ventricular wall motion. Assessment/Plan:  1.  Atypical chest pain with negative high sensitive serum troponin and normal EKG and reproducible chest pain. 2.  High risk abnormal nuclear stress test revealing multiple territory ischemia, suspect these are artifacts. 3.  Essential hypertension, controlled 4.  Morbid obesity in view of diabetes mellitus and a BMI of 37.  Recommendation: Although patient symptoms are very atypical, in view of underlying risk factors and abnormal nuclear stress test, and ongoing chest pain, I will proceed with cardiac catheterization. Discussed risks, benefits and alternatives of angiogram including but not limited to <1% risk of death, stroke, MI, need for urgent surgical revascularization, renal failure, but not limited to thest. patient is willing to proceed.   Adrian Prows, M.D. 01/27/2019, 8:28 AM [facility] Pager: [phone] Office: [phone] If no answer: [phone]

## 2019-01-27 NOTE — Interval H&P Note (Signed)
History and Physical Interval Note:  01/27/2019 3:34 PM  [name] [name]  has presented today for surgery, with the diagnosis of Chest Pain.  The various methods of treatment have been discussed with the patient and family. After consideration of risks, benefits and other options for treatment, the patient has consented to  Procedure(s): LEFT HEART CATH AND CORONARY ANGIOGRAPHY (N/A) as a surgical intervention.  The patient's history has been reviewed, patient examined, no change in status, stable for surgery.  I have reviewed the patient's chart and labs.  Questions were answered to the patient's satisfaction.    2016/2017 Appropriate Use Criteria for Coronary Revascularization Clinical Presentation: Diabetes Mellitus? Symptom Status? S/P CABG? Antianginal Therapy (# of long-acting drugs)? Results of Non-invasive testing? FFR/iFR results in all diseased vessels? Patient undergoing renal transplant? Patient undergoing percutaneous valve procedure (TAVR, MitraClip, Others)? Symptom Status:  Ischemic Symptoms  Non-invasive Testing:  High risk  If no or indeterminate stress test, FFR/iFR results in all diseased vessels:  N/A  Diabetes Mellitus:  Yes  S/P CABG:  No  Antianginal therapy (number of long-acting drugs):  1  Patient undergoing renal transplant:  No  Patient undergoing percutaneous valve procedure:  No    newline 1 Vessel Disease PCI CABG  No proximal LAD involvement, No proximal left dominant LCX involvement M (6); Indication 2 M (4); Indication 2   Proximal left dominant LCX involvement A (7); Indication 5 A (7); Indication 5   Proximal LAD involvement A (7); Indication 5 A (7); Indication 5   newline 2 Vessel Disease  No proximal LAD involvement A (7); Indication 8 M (6); Indication 8   Proximal LAD involvement A (7); Indication 14 A (8); Indication 14   newline 3 Vessel Disease  Low disease complexity (e.g., focal stenoses, SYNTAX <=22) A (7); Indication 19 A (8); Indication  19   Intermediate or high disease complexity (e.g., SYNTAX >=23) M (5); Indication 23 A (8); Indication 23   newline Left Main Disease  Isolated LMCA disease: ostial or midshaft A (7); Indication 24 A (9); Indication 24   Isolated LMCA disease: bifurcation involvement M (5); Indication 25 A (9); Indication 25   LMCA ostial or midshaft, concurrent low disease burden multivessel disease (e.g., 1-2 additional focal stenoses, SYNTAX <=22) A (7); Indication 26 A (9); Indication 26   LMCA ostial or midshaft, concurrent intermediate or high disease burden multivessel disease (e.g., 1-2 additional bifurcation stenoses, long stenoses, SYNTAX >=23) M (4); Indication 27 A (9); Indication 27   LMCA bifurcation involvement, concurrent low disease burden multivessel disease (e.g., 1-2 additional focal stenoses, SYNTAX <=22) M (5); Indication 28 A (9); Indication 28   LMCA bifurcation involvement, concurrent intermediate or high disease burden multivessel disease (e.g., 1-2 additional bifurcation stenoses, long stenoses, SYNTAX >=23) R (3); Indication 29 A (9); Indication Benson

## 2019-01-27 NOTE — Progress Notes (Signed)
She is awaiting a cath. Will follow.

## 2019-01-28 ENCOUNTER — Encounter (HOSPITAL_COMMUNITY): Payer: Self-pay | Admitting: Cardiology

## 2019-01-28 ENCOUNTER — Inpatient Hospital Stay (HOSPITAL_COMMUNITY): Payer: Medicare Other

## 2019-01-28 DIAGNOSIS — R0789 Other chest pain: Secondary | ICD-10-CM

## 2019-01-28 LAB — BASIC METABOLIC PANEL
Anion gap: 8 (ref 5–15)
BUN: 18 mg/dL (ref 8–23)
CO2: 24 mmol/L (ref 22–32)
Calcium: 8.9 mg/dL (ref 8.9–10.3)
Chloride: 106 mmol/L (ref 98–111)
Creatinine, Ser: 0.99 mg/dL (ref 0.44–1.00)
GFR calc Af Amer: >60 mL/min (ref >60)
GFR calc non Af Amer: 57 mL/min — ABNORMAL LOW (ref >60)
Glucose, Bld: 126 mg/dL — ABNORMAL HIGH (ref 70–99)
Potassium: 3.6 mmol/L (ref 3.5–5.1)
Sodium: 138 mmol/L (ref 135–145)

## 2019-01-28 LAB — ECHOCARDIOGRAM COMPLETE
Height: 60 in
Weight: 3019.42 oz

## 2019-01-28 LAB — GLUCOSE, CAPILLARY
Glucose-Capillary: 135 mg/dL — ABNORMAL HIGH (ref 70–99)
Glucose-Capillary: 219 mg/dL — ABNORMAL HIGH (ref 70–99)

## 2019-01-28 MED ORDER — DICLOFENAC SODIUM 1 % TD GEL: 2.0000 g | Freq: Four times a day (QID) | TRANSDERMAL | 0 refills | Status: AC

## 2019-01-28 NOTE — Care Management Important Message (Signed)
Important Message  Patient Details  Name: [name] [name] MRN: [id number] Date of Birth: 1946-05-23   Medicare Important Message Given:  Yes     Shelda Altes 01/28/2019, 2:37 PM

## 2019-01-28 NOTE — Progress Notes (Signed)
2D Echocardiogram has been performed.  [name] [name] 01/28/2019, 8:34 AM

## 2019-01-28 NOTE — Discharge Instructions (Signed)
Continue to apply Voltaren gel to tender areas on left chest wall 4 x a day.   You were cared for by a hospitalist during your hospital stay. If you have any questions about your discharge medications or the care you received while you were in the hospital after you are discharged, you can call the unit and asked to speak with the hospitalist on call if the hospitalist that took care of you is not available. Once you are discharged, your primary care physician will handle any further medical issues.   Please note that NO REFILLS for any discharge medications will be authorized once you are discharged, as it is imperative that you return to your primary care physician (or establish a relationship with a primary care physician if you do not have one) for your aftercare needs so that they can reassess your need for medications and monitor your lab values.  Please take all your medications with you for your next visit with your Primary MD. Please ask your Primary MD to get all Hospital records sent to his/her office. Please request your Primary MD to go over all hospital test results at the follow up.   If you experience worsening of your admission symptoms, develop shortness of breath, chest pain, suicidal or homicidal thoughts or a life threatening emergency, you must seek medical attention immediately by calling 911 or calling your MD.   Dennis Bast must read the complete instructions/literature along with all the possible adverse reactions/side effects for all the medicines you take including new medications that have been prescribed to you. Take new medicines after you have completely understood and accpet all the possible adverse reactions/side effects.    Do not drive when taking pain medications or sedatives.     Do not take more than prescribed Pain, Sleep and Anxiety Medications   If you have smoked or chewed Tobacco in the last 2 yrs please stop. Stop any regular alcohol  and or recreational drug  use.   Wear Seat belts while driving.

## 2019-02-09 DIAGNOSIS — E1140 Type 2 diabetes mellitus with diabetic neuropathy, unspecified: Secondary | ICD-10-CM | POA: Diagnosis not present

## 2019-02-09 DIAGNOSIS — J4520 Mild intermittent asthma, uncomplicated: Secondary | ICD-10-CM | POA: Diagnosis not present

## 2019-02-09 DIAGNOSIS — I10 Essential (primary) hypertension: Secondary | ICD-10-CM | POA: Diagnosis not present

## 2019-02-09 DIAGNOSIS — E1165 Type 2 diabetes mellitus with hyperglycemia: Secondary | ICD-10-CM | POA: Diagnosis not present

## 2019-02-09 DIAGNOSIS — Z794 Long term (current) use of insulin: Secondary | ICD-10-CM | POA: Diagnosis not present

## 2019-02-18 ENCOUNTER — Ambulatory Visit: Payer: Self-pay | Admitting: Cardiology

## 2019-02-24 DIAGNOSIS — E1142 Type 2 diabetes mellitus with diabetic polyneuropathy: Secondary | ICD-10-CM | POA: Diagnosis not present

## 2019-02-24 DIAGNOSIS — G43709 Chronic migraine without aura, not intractable, without status migrainosus: Secondary | ICD-10-CM | POA: Diagnosis not present

## 2019-03-01 DIAGNOSIS — R05 Cough: Secondary | ICD-10-CM | POA: Diagnosis not present

## 2019-03-31 DIAGNOSIS — Z794 Long term (current) use of insulin: Secondary | ICD-10-CM | POA: Diagnosis not present

## 2019-03-31 DIAGNOSIS — J4520 Mild intermittent asthma, uncomplicated: Secondary | ICD-10-CM | POA: Diagnosis not present

## 2019-03-31 DIAGNOSIS — E1140 Type 2 diabetes mellitus with diabetic neuropathy, unspecified: Secondary | ICD-10-CM | POA: Diagnosis not present

## 2019-03-31 DIAGNOSIS — E1165 Type 2 diabetes mellitus with hyperglycemia: Secondary | ICD-10-CM | POA: Diagnosis not present

## 2019-03-31 DIAGNOSIS — I10 Essential (primary) hypertension: Secondary | ICD-10-CM | POA: Diagnosis not present

## 2019-04-01 DIAGNOSIS — I10 Essential (primary) hypertension: Secondary | ICD-10-CM | POA: Diagnosis not present

## 2019-04-01 DIAGNOSIS — R0789 Other chest pain: Secondary | ICD-10-CM | POA: Diagnosis not present

## 2019-04-01 DIAGNOSIS — R079 Chest pain, unspecified: Secondary | ICD-10-CM | POA: Diagnosis not present

## 2019-04-01 DIAGNOSIS — E1142 Type 2 diabetes mellitus with diabetic polyneuropathy: Secondary | ICD-10-CM | POA: Diagnosis not present

## 2019-04-05 DIAGNOSIS — E1140 Type 2 diabetes mellitus with diabetic neuropathy, unspecified: Secondary | ICD-10-CM | POA: Diagnosis not present

## 2019-04-05 DIAGNOSIS — E1169 Type 2 diabetes mellitus with other specified complication: Secondary | ICD-10-CM | POA: Diagnosis not present

## 2019-04-05 DIAGNOSIS — I10 Essential (primary) hypertension: Secondary | ICD-10-CM | POA: Diagnosis not present

## 2019-04-07 DIAGNOSIS — I272 Pulmonary hypertension, unspecified: Secondary | ICD-10-CM | POA: Diagnosis not present

## 2019-04-07 DIAGNOSIS — R079 Chest pain, unspecified: Secondary | ICD-10-CM | POA: Diagnosis not present

## 2019-04-07 DIAGNOSIS — I700 Atherosclerosis of aorta: Secondary | ICD-10-CM | POA: Diagnosis not present

## 2019-04-07 DIAGNOSIS — R0789 Other chest pain: Secondary | ICD-10-CM | POA: Diagnosis not present

## 2019-04-29 DIAGNOSIS — E113293 Type 2 diabetes mellitus with mild nonproliferative diabetic retinopathy without macular edema, bilateral: Secondary | ICD-10-CM | POA: Diagnosis not present

## 2019-04-29 DIAGNOSIS — H35043 Retinal micro-aneurysms, unspecified, bilateral: Secondary | ICD-10-CM | POA: Diagnosis not present

## 2019-04-29 DIAGNOSIS — Z794 Long term (current) use of insulin: Secondary | ICD-10-CM | POA: Diagnosis not present

## 2019-04-29 DIAGNOSIS — H401134 Primary open-angle glaucoma, bilateral, indeterminate stage: Secondary | ICD-10-CM | POA: Diagnosis not present

## 2019-05-11 DIAGNOSIS — E1140 Type 2 diabetes mellitus with diabetic neuropathy, unspecified: Secondary | ICD-10-CM | POA: Diagnosis not present

## 2019-05-11 DIAGNOSIS — Z794 Long term (current) use of insulin: Secondary | ICD-10-CM | POA: Diagnosis not present

## 2019-05-11 DIAGNOSIS — E1165 Type 2 diabetes mellitus with hyperglycemia: Secondary | ICD-10-CM | POA: Diagnosis not present

## 2019-05-11 DIAGNOSIS — I10 Essential (primary) hypertension: Secondary | ICD-10-CM | POA: Diagnosis not present

## 2019-05-11 DIAGNOSIS — Z23 Encounter for immunization: Secondary | ICD-10-CM | POA: Diagnosis not present

## 2019-05-11 DIAGNOSIS — J4520 Mild intermittent asthma, uncomplicated: Secondary | ICD-10-CM | POA: Diagnosis not present

## 2019-05-26 DIAGNOSIS — I10 Essential (primary) hypertension: Secondary | ICD-10-CM | POA: Diagnosis not present

## 2019-05-26 DIAGNOSIS — Z794 Long term (current) use of insulin: Secondary | ICD-10-CM | POA: Diagnosis not present

## 2019-05-26 DIAGNOSIS — N182 Chronic kidney disease, stage 2 (mild): Secondary | ICD-10-CM | POA: Diagnosis not present

## 2019-05-26 DIAGNOSIS — E1165 Type 2 diabetes mellitus with hyperglycemia: Secondary | ICD-10-CM | POA: Diagnosis not present

## 2019-05-26 DIAGNOSIS — J4520 Mild intermittent asthma, uncomplicated: Secondary | ICD-10-CM | POA: Diagnosis not present

## 2019-06-10 IMAGING — MG DIGITAL SCREENING BILATERAL MAMMOGRAM WITH TOMO AND CAD
6 of 10 series · 6 of 30 positions shown · non-contrast
Comparison: Previous exam(s).

ACR Breast Density Category a: The breast tissue is almost entirely
fatty.

CLINICAL DATA: Screening.

EXAM:
DIGITAL SCREENING BILATERAL MAMMOGRAM WITH TOMO AND CAD

[R CC synth-2D]
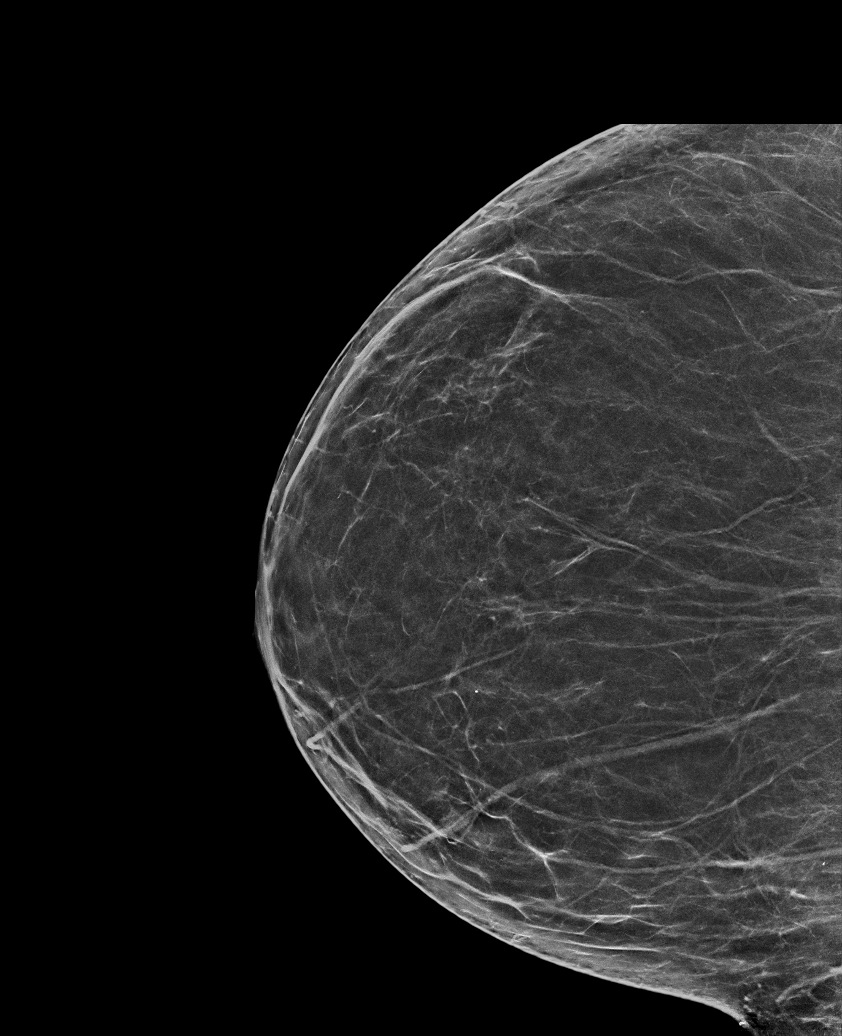

[L XCCL synth-2D]
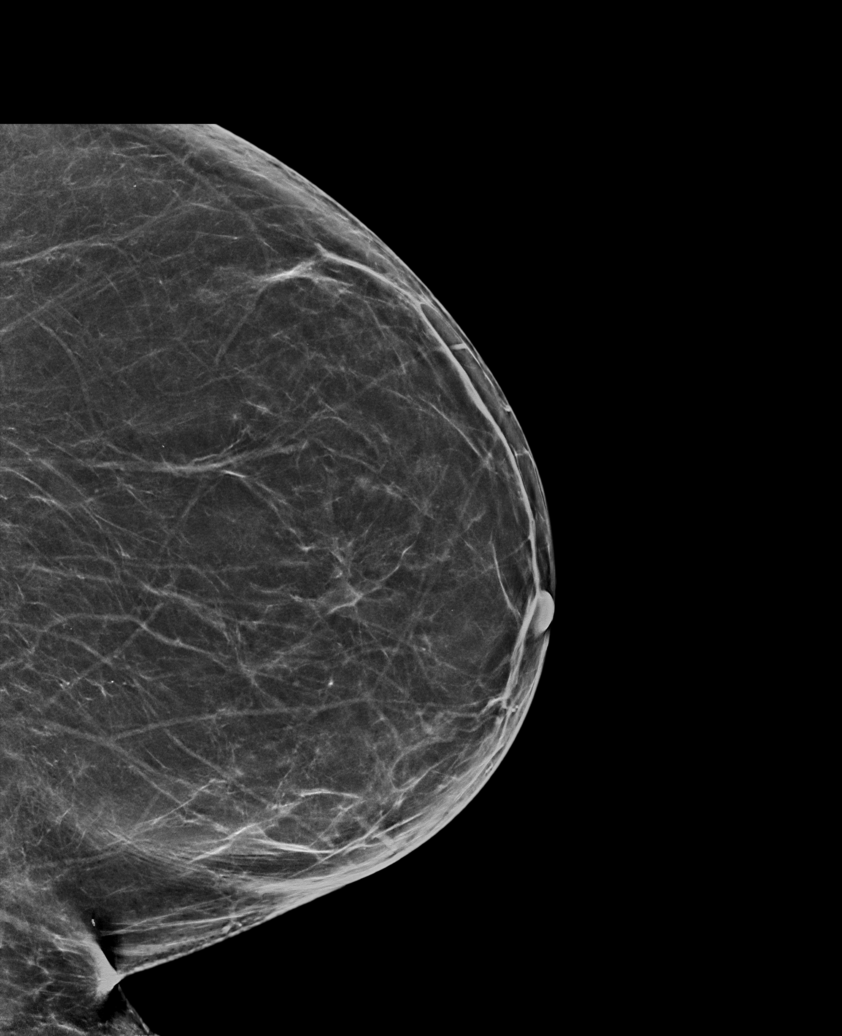

[L CC synth-2D]
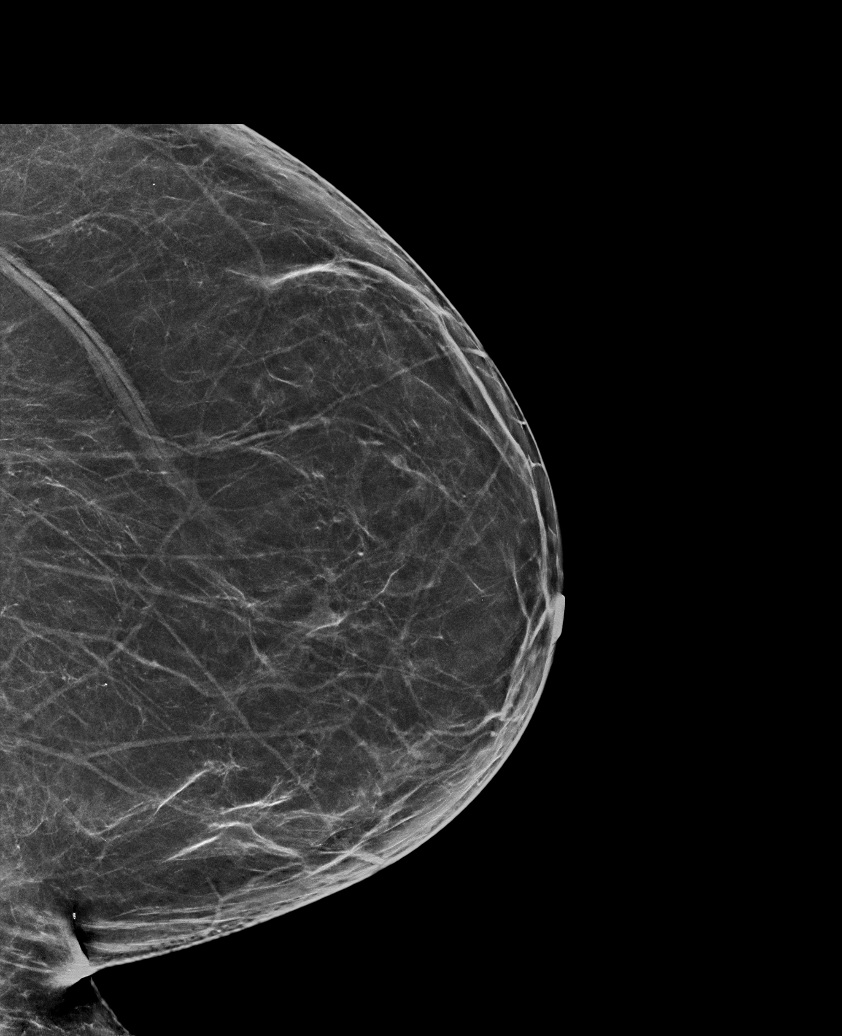

[R MLO synth-2D]
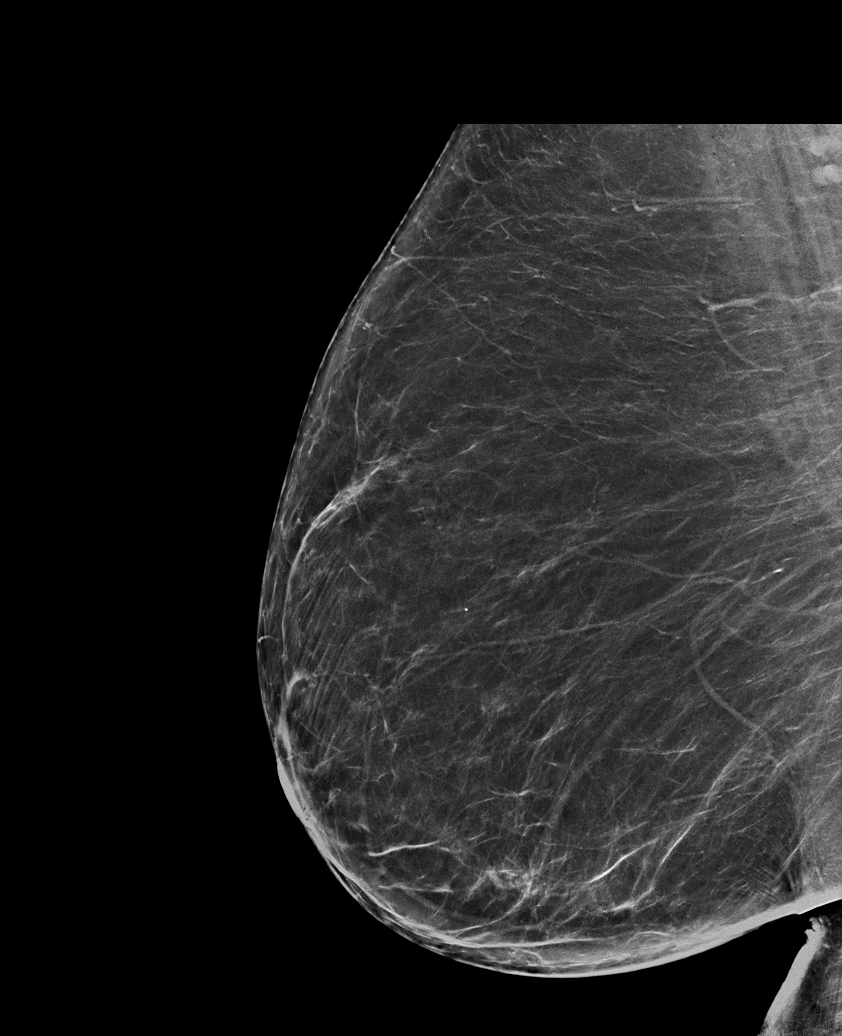

[L MLO synth-2D]
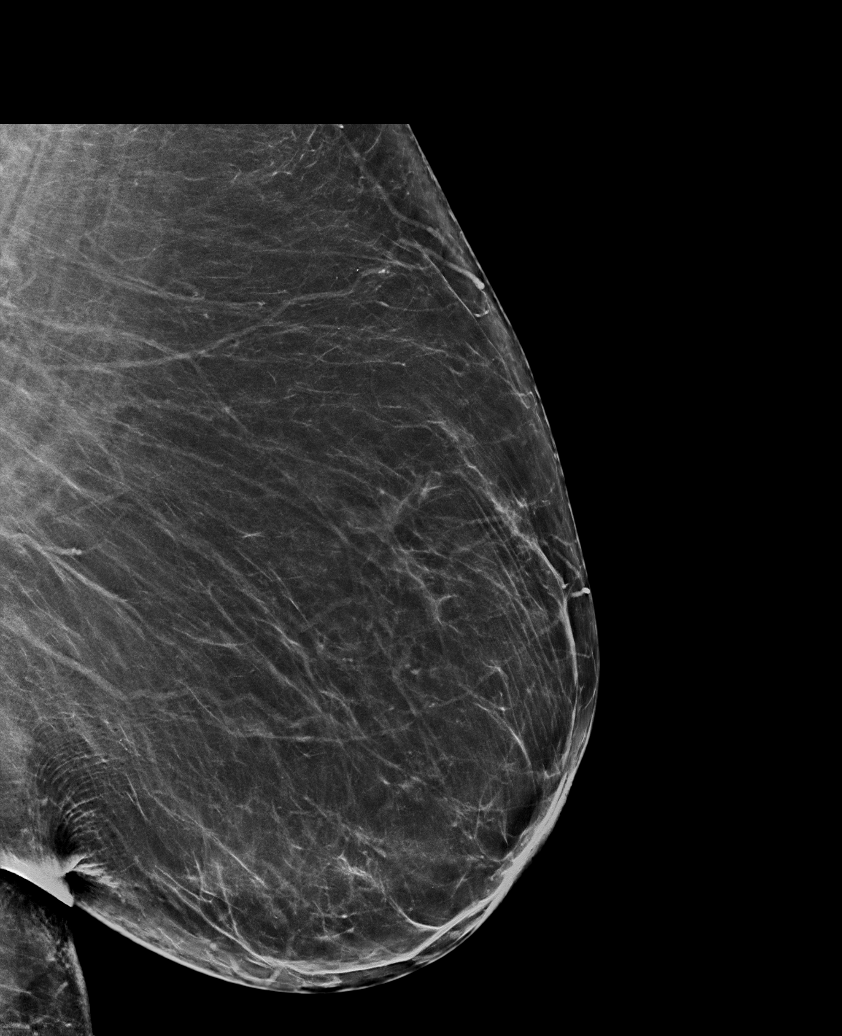

[R CC tomo · tomo slice 35/70.0]
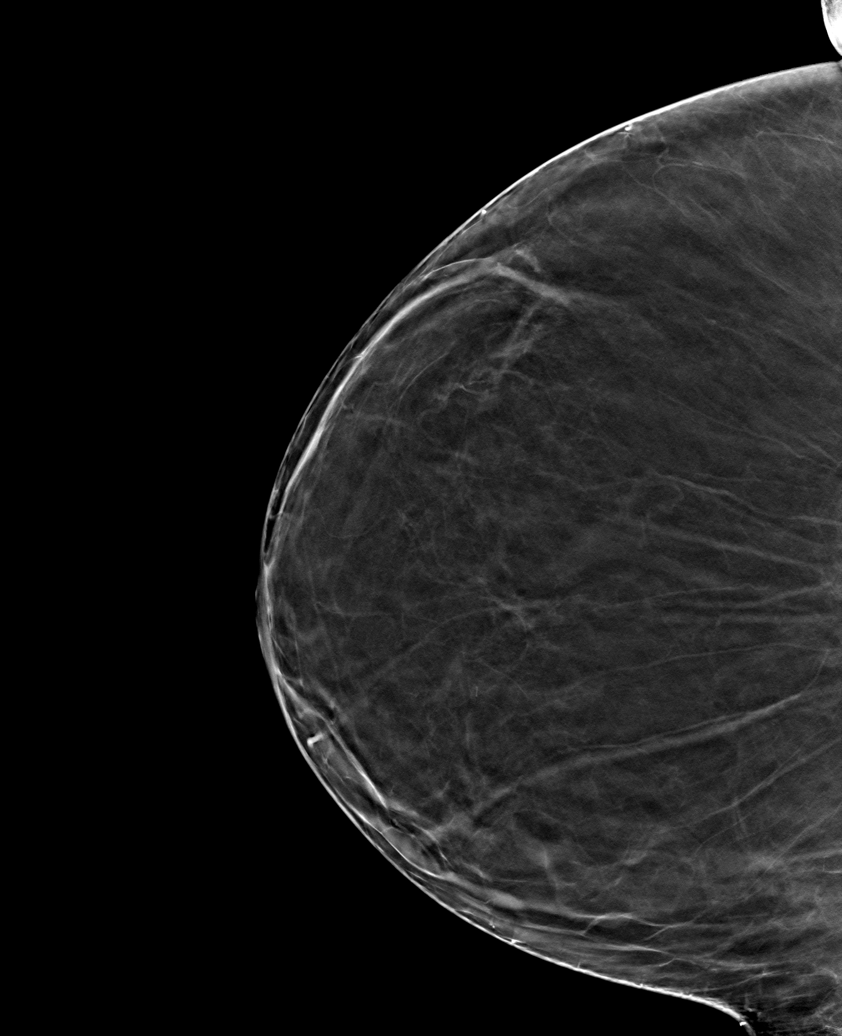

[6 of 30 positions shown; findings below may reference images not displayed]

FINDINGS: There are no findings suspicious for malignancy. Images were
processed with CAD.
IMPRESSION: No mammographic evidence of malignancy. A result letter of this
screening mammogram will be mailed directly to the patient.

RECOMMENDATION:
Screening mammogram in one year. (Code:[id number])

BI-RADS CATEGORY  1: Negative.

## 2019-08-02 DIAGNOSIS — N182 Chronic kidney disease, stage 2 (mild): Secondary | ICD-10-CM | POA: Diagnosis not present

## 2019-08-02 DIAGNOSIS — J4520 Mild intermittent asthma, uncomplicated: Secondary | ICD-10-CM | POA: Diagnosis not present

## 2019-08-02 DIAGNOSIS — I10 Essential (primary) hypertension: Secondary | ICD-10-CM | POA: Diagnosis not present

## 2019-08-02 DIAGNOSIS — E1140 Type 2 diabetes mellitus with diabetic neuropathy, unspecified: Secondary | ICD-10-CM | POA: Diagnosis not present

## 2019-08-02 DIAGNOSIS — R05 Cough: Secondary | ICD-10-CM | POA: Diagnosis not present

## 2019-08-06 DIAGNOSIS — E119 Type 2 diabetes mellitus without complications: Secondary | ICD-10-CM | POA: Diagnosis not present

## 2019-08-06 DIAGNOSIS — E1140 Type 2 diabetes mellitus with diabetic neuropathy, unspecified: Secondary | ICD-10-CM | POA: Diagnosis not present

## 2019-08-06 DIAGNOSIS — I10 Essential (primary) hypertension: Secondary | ICD-10-CM | POA: Diagnosis not present

## 2019-09-02 DIAGNOSIS — H401134 Primary open-angle glaucoma, bilateral, indeterminate stage: Secondary | ICD-10-CM | POA: Diagnosis not present

## 2019-09-13 DIAGNOSIS — Z131 Encounter for screening for diabetes mellitus: Secondary | ICD-10-CM | POA: Diagnosis not present

## 2019-09-13 DIAGNOSIS — N182 Chronic kidney disease, stage 2 (mild): Secondary | ICD-10-CM | POA: Diagnosis not present

## 2019-09-13 DIAGNOSIS — Z0001 Encounter for general adult medical examination with abnormal findings: Secondary | ICD-10-CM | POA: Diagnosis not present

## 2019-09-13 DIAGNOSIS — Z1389 Encounter for screening for other disorder: Secondary | ICD-10-CM | POA: Diagnosis not present

## 2019-09-13 DIAGNOSIS — Z136 Encounter for screening for cardiovascular disorders: Secondary | ICD-10-CM | POA: Diagnosis not present

## 2019-09-13 DIAGNOSIS — Z1329 Encounter for screening for other suspected endocrine disorder: Secondary | ICD-10-CM | POA: Diagnosis not present

## 2019-09-13 DIAGNOSIS — J4520 Mild intermittent asthma, uncomplicated: Secondary | ICD-10-CM | POA: Diagnosis not present

## 2019-09-13 DIAGNOSIS — E1140 Type 2 diabetes mellitus with diabetic neuropathy, unspecified: Secondary | ICD-10-CM | POA: Diagnosis not present

## 2019-09-13 DIAGNOSIS — I10 Essential (primary) hypertension: Secondary | ICD-10-CM | POA: Diagnosis not present

## 2019-09-28 DIAGNOSIS — R569 Unspecified convulsions: Secondary | ICD-10-CM | POA: Diagnosis not present

## 2019-09-28 DIAGNOSIS — G252 Other specified forms of tremor: Secondary | ICD-10-CM | POA: Diagnosis not present

## 2019-09-28 DIAGNOSIS — G4733 Obstructive sleep apnea (adult) (pediatric): Secondary | ICD-10-CM | POA: Diagnosis not present

## 2019-09-28 DIAGNOSIS — G43709 Chronic migraine without aura, not intractable, without status migrainosus: Secondary | ICD-10-CM | POA: Diagnosis not present

## 2019-10-12 DIAGNOSIS — E1165 Type 2 diabetes mellitus with hyperglycemia: Secondary | ICD-10-CM | POA: Diagnosis not present

## 2019-10-12 DIAGNOSIS — I10 Essential (primary) hypertension: Secondary | ICD-10-CM | POA: Diagnosis not present

## 2019-10-12 DIAGNOSIS — N182 Chronic kidney disease, stage 2 (mild): Secondary | ICD-10-CM | POA: Diagnosis not present

## 2019-10-12 DIAGNOSIS — J4520 Mild intermittent asthma, uncomplicated: Secondary | ICD-10-CM | POA: Diagnosis not present

## 2019-10-19 DIAGNOSIS — R5381 Other malaise: Secondary | ICD-10-CM | POA: Diagnosis not present

## 2019-10-19 DIAGNOSIS — R0789 Other chest pain: Secondary | ICD-10-CM | POA: Diagnosis not present

## 2019-10-19 DIAGNOSIS — R079 Chest pain, unspecified: Secondary | ICD-10-CM | POA: Diagnosis not present

## 2019-10-19 DIAGNOSIS — I10 Essential (primary) hypertension: Secondary | ICD-10-CM | POA: Diagnosis not present

## 2019-10-19 DIAGNOSIS — G4733 Obstructive sleep apnea (adult) (pediatric): Secondary | ICD-10-CM | POA: Diagnosis not present

## 2019-10-19 DIAGNOSIS — R001 Bradycardia, unspecified: Secondary | ICD-10-CM | POA: Diagnosis not present

## 2019-10-20 DIAGNOSIS — Z0001 Encounter for general adult medical examination with abnormal findings: Secondary | ICD-10-CM | POA: Diagnosis not present

## 2019-10-20 DIAGNOSIS — E1140 Type 2 diabetes mellitus with diabetic neuropathy, unspecified: Secondary | ICD-10-CM | POA: Diagnosis not present

## 2019-10-20 DIAGNOSIS — R569 Unspecified convulsions: Secondary | ICD-10-CM | POA: Diagnosis not present

## 2019-10-20 DIAGNOSIS — I10 Essential (primary) hypertension: Secondary | ICD-10-CM | POA: Diagnosis not present

## 2019-10-20 DIAGNOSIS — E1165 Type 2 diabetes mellitus with hyperglycemia: Secondary | ICD-10-CM | POA: Diagnosis not present

## 2019-10-20 DIAGNOSIS — J4520 Mild intermittent asthma, uncomplicated: Secondary | ICD-10-CM | POA: Diagnosis not present

## 2019-10-27 DIAGNOSIS — R079 Chest pain, unspecified: Secondary | ICD-10-CM | POA: Diagnosis not present

## 2019-10-27 DIAGNOSIS — R0789 Other chest pain: Secondary | ICD-10-CM | POA: Diagnosis not present

## 2019-11-09 DIAGNOSIS — E1140 Type 2 diabetes mellitus with diabetic neuropathy, unspecified: Secondary | ICD-10-CM | POA: Diagnosis not present

## 2019-11-09 DIAGNOSIS — E119 Type 2 diabetes mellitus without complications: Secondary | ICD-10-CM | POA: Diagnosis not present

## 2019-11-09 DIAGNOSIS — I10 Essential (primary) hypertension: Secondary | ICD-10-CM | POA: Diagnosis not present

## 2020-01-03 DIAGNOSIS — G43709 Chronic migraine without aura, not intractable, without status migrainosus: Secondary | ICD-10-CM | POA: Diagnosis not present

## 2020-01-03 DIAGNOSIS — M545 Low back pain: Secondary | ICD-10-CM | POA: Diagnosis not present

## 2020-01-03 DIAGNOSIS — G8929 Other chronic pain: Secondary | ICD-10-CM | POA: Diagnosis not present

## 2020-01-03 DIAGNOSIS — G4733 Obstructive sleep apnea (adult) (pediatric): Secondary | ICD-10-CM | POA: Diagnosis not present

## 2020-01-04 DIAGNOSIS — N644 Mastodynia: Secondary | ICD-10-CM | POA: Diagnosis not present

## 2020-01-04 DIAGNOSIS — E1165 Type 2 diabetes mellitus with hyperglycemia: Secondary | ICD-10-CM | POA: Diagnosis not present

## 2020-01-04 DIAGNOSIS — R569 Unspecified convulsions: Secondary | ICD-10-CM | POA: Diagnosis not present

## 2020-01-04 DIAGNOSIS — I10 Essential (primary) hypertension: Secondary | ICD-10-CM | POA: Diagnosis not present

## 2020-01-04 DIAGNOSIS — E1140 Type 2 diabetes mellitus with diabetic neuropathy, unspecified: Secondary | ICD-10-CM | POA: Diagnosis not present

## 2020-01-04 DIAGNOSIS — J4520 Mild intermittent asthma, uncomplicated: Secondary | ICD-10-CM | POA: Diagnosis not present

## 2020-01-06 DIAGNOSIS — G43709 Chronic migraine without aura, not intractable, without status migrainosus: Secondary | ICD-10-CM | POA: Diagnosis not present

## 2020-01-17 DIAGNOSIS — N6322 Unspecified lump in the left breast, upper inner quadrant: Secondary | ICD-10-CM | POA: Diagnosis not present

## 2020-01-17 DIAGNOSIS — N6321 Unspecified lump in the left breast, upper outer quadrant: Secondary | ICD-10-CM | POA: Diagnosis not present

## 2020-01-17 DIAGNOSIS — R928 Other abnormal and inconclusive findings on diagnostic imaging of breast: Secondary | ICD-10-CM | POA: Diagnosis not present

## 2020-01-21 DIAGNOSIS — C50812 Malignant neoplasm of overlapping sites of left female breast: Secondary | ICD-10-CM | POA: Diagnosis not present

## 2020-01-21 DIAGNOSIS — N6325 Unspecified lump in the left breast, overlapping quadrants: Secondary | ICD-10-CM | POA: Diagnosis not present

## 2020-01-21 DIAGNOSIS — C50412 Malignant neoplasm of upper-outer quadrant of left female breast: Secondary | ICD-10-CM | POA: Diagnosis not present

## 2020-01-21 DIAGNOSIS — Z171 Estrogen receptor negative status [ER-]: Secondary | ICD-10-CM | POA: Diagnosis not present

## 2020-01-21 DIAGNOSIS — Z170 Estrogen receptor positive status [ER+]: Secondary | ICD-10-CM | POA: Diagnosis not present

## 2020-01-21 DIAGNOSIS — R928 Other abnormal and inconclusive findings on diagnostic imaging of breast: Secondary | ICD-10-CM | POA: Diagnosis not present

## 2020-02-04 DIAGNOSIS — Z171 Estrogen receptor negative status [ER-]: Secondary | ICD-10-CM | POA: Diagnosis not present

## 2020-02-04 DIAGNOSIS — Z170 Estrogen receptor positive status [ER+]: Secondary | ICD-10-CM | POA: Diagnosis not present

## 2020-02-04 DIAGNOSIS — C50812 Malignant neoplasm of overlapping sites of left female breast: Secondary | ICD-10-CM | POA: Diagnosis not present

## 2020-02-04 DIAGNOSIS — C50412 Malignant neoplasm of upper-outer quadrant of left female breast: Secondary | ICD-10-CM | POA: Diagnosis not present

## 2020-02-15 DIAGNOSIS — Z170 Estrogen receptor positive status [ER+]: Secondary | ICD-10-CM | POA: Diagnosis not present

## 2020-02-15 DIAGNOSIS — C773 Secondary and unspecified malignant neoplasm of axilla and upper limb lymph nodes: Secondary | ICD-10-CM | POA: Diagnosis not present

## 2020-02-15 DIAGNOSIS — C50412 Malignant neoplasm of upper-outer quadrant of left female breast: Secondary | ICD-10-CM | POA: Diagnosis not present

## 2020-02-15 DIAGNOSIS — C50912 Malignant neoplasm of unspecified site of left female breast: Secondary | ICD-10-CM | POA: Diagnosis not present

## 2020-02-24 DIAGNOSIS — E1140 Type 2 diabetes mellitus with diabetic neuropathy, unspecified: Secondary | ICD-10-CM | POA: Diagnosis not present

## 2020-02-24 DIAGNOSIS — E118 Type 2 diabetes mellitus with unspecified complications: Secondary | ICD-10-CM | POA: Diagnosis not present

## 2020-02-24 DIAGNOSIS — I10 Essential (primary) hypertension: Secondary | ICD-10-CM | POA: Diagnosis not present

## 2020-02-24 DIAGNOSIS — C50912 Malignant neoplasm of unspecified site of left female breast: Secondary | ICD-10-CM | POA: Diagnosis not present

## 2020-03-01 DIAGNOSIS — C50412 Malignant neoplasm of upper-outer quadrant of left female breast: Secondary | ICD-10-CM | POA: Diagnosis not present

## 2020-03-01 DIAGNOSIS — D0512 Intraductal carcinoma in situ of left breast: Secondary | ICD-10-CM | POA: Diagnosis not present

## 2020-03-01 DIAGNOSIS — C773 Secondary and unspecified malignant neoplasm of axilla and upper limb lymph nodes: Secondary | ICD-10-CM | POA: Diagnosis not present

## 2020-03-01 DIAGNOSIS — Z170 Estrogen receptor positive status [ER+]: Secondary | ICD-10-CM | POA: Diagnosis not present

## 2020-03-03 DIAGNOSIS — Z01812 Encounter for preprocedural laboratory examination: Secondary | ICD-10-CM | POA: Diagnosis not present

## 2020-03-03 DIAGNOSIS — C50412 Malignant neoplasm of upper-outer quadrant of left female breast: Secondary | ICD-10-CM | POA: Diagnosis not present

## 2020-03-03 DIAGNOSIS — Z170 Estrogen receptor positive status [ER+]: Secondary | ICD-10-CM | POA: Diagnosis not present

## 2020-03-06 DIAGNOSIS — C50912 Malignant neoplasm of unspecified site of left female breast: Secondary | ICD-10-CM | POA: Diagnosis not present

## 2020-03-06 DIAGNOSIS — I289 Disease of pulmonary vessels, unspecified: Secondary | ICD-10-CM | POA: Diagnosis not present

## 2020-03-06 DIAGNOSIS — I2510 Atherosclerotic heart disease of native coronary artery without angina pectoris: Secondary | ICD-10-CM | POA: Diagnosis not present

## 2020-03-06 DIAGNOSIS — C50412 Malignant neoplasm of upper-outer quadrant of left female breast: Secondary | ICD-10-CM | POA: Diagnosis not present

## 2020-03-06 DIAGNOSIS — N6489 Other specified disorders of breast: Secondary | ICD-10-CM | POA: Diagnosis not present

## 2020-03-06 DIAGNOSIS — Z170 Estrogen receptor positive status [ER+]: Secondary | ICD-10-CM | POA: Diagnosis not present

## 2020-03-06 DIAGNOSIS — I700 Atherosclerosis of aorta: Secondary | ICD-10-CM | POA: Diagnosis not present

## 2020-03-06 DIAGNOSIS — R590 Localized enlarged lymph nodes: Secondary | ICD-10-CM | POA: Diagnosis not present

## 2020-03-06 DIAGNOSIS — I7789 Other specified disorders of arteries and arterioles: Secondary | ICD-10-CM | POA: Diagnosis not present

## 2020-03-06 DIAGNOSIS — K228 Other specified diseases of esophagus: Secondary | ICD-10-CM | POA: Diagnosis not present

## 2020-03-13 DIAGNOSIS — C771 Secondary and unspecified malignant neoplasm of intrathoracic lymph nodes: Secondary | ICD-10-CM | POA: Diagnosis not present

## 2020-03-13 DIAGNOSIS — R590 Localized enlarged lymph nodes: Secondary | ICD-10-CM | POA: Diagnosis not present

## 2020-03-13 DIAGNOSIS — Z170 Estrogen receptor positive status [ER+]: Secondary | ICD-10-CM | POA: Diagnosis not present

## 2020-03-13 DIAGNOSIS — C50912 Malignant neoplasm of unspecified site of left female breast: Secondary | ICD-10-CM | POA: Diagnosis not present

## 2020-03-13 DIAGNOSIS — Z171 Estrogen receptor negative status [ER-]: Secondary | ICD-10-CM | POA: Diagnosis not present

## 2020-03-13 DIAGNOSIS — C50412 Malignant neoplasm of upper-outer quadrant of left female breast: Secondary | ICD-10-CM | POA: Diagnosis not present

## 2020-03-13 DIAGNOSIS — C50812 Malignant neoplasm of overlapping sites of left female breast: Secondary | ICD-10-CM | POA: Diagnosis not present

## 2020-03-14 DIAGNOSIS — Z170 Estrogen receptor positive status [ER+]: Secondary | ICD-10-CM | POA: Diagnosis not present

## 2020-03-14 DIAGNOSIS — C50412 Malignant neoplasm of upper-outer quadrant of left female breast: Secondary | ICD-10-CM | POA: Diagnosis not present

## 2020-03-14 DIAGNOSIS — Z7189 Other specified counseling: Secondary | ICD-10-CM | POA: Diagnosis not present

## 2020-03-16 DIAGNOSIS — E1140 Type 2 diabetes mellitus with diabetic neuropathy, unspecified: Secondary | ICD-10-CM | POA: Diagnosis not present

## 2020-03-16 DIAGNOSIS — I10 Essential (primary) hypertension: Secondary | ICD-10-CM | POA: Diagnosis not present

## 2020-03-16 DIAGNOSIS — E118 Type 2 diabetes mellitus with unspecified complications: Secondary | ICD-10-CM | POA: Diagnosis not present

## 2020-03-16 DIAGNOSIS — C50912 Malignant neoplasm of unspecified site of left female breast: Secondary | ICD-10-CM | POA: Diagnosis not present

## 2020-03-20 DIAGNOSIS — R569 Unspecified convulsions: Secondary | ICD-10-CM | POA: Diagnosis not present

## 2020-03-20 DIAGNOSIS — E669 Obesity, unspecified: Secondary | ICD-10-CM | POA: Diagnosis not present

## 2020-03-20 DIAGNOSIS — Z23 Encounter for immunization: Secondary | ICD-10-CM | POA: Diagnosis not present

## 2020-03-20 DIAGNOSIS — G43909 Migraine, unspecified, not intractable, without status migrainosus: Secondary | ICD-10-CM | POA: Diagnosis not present

## 2020-03-20 DIAGNOSIS — Z794 Long term (current) use of insulin: Secondary | ICD-10-CM | POA: Diagnosis not present

## 2020-03-20 DIAGNOSIS — E1165 Type 2 diabetes mellitus with hyperglycemia: Secondary | ICD-10-CM | POA: Diagnosis not present

## 2020-03-20 DIAGNOSIS — I10 Essential (primary) hypertension: Secondary | ICD-10-CM | POA: Diagnosis not present

## 2020-03-20 DIAGNOSIS — J4520 Mild intermittent asthma, uncomplicated: Secondary | ICD-10-CM | POA: Diagnosis not present

## 2020-03-20 DIAGNOSIS — E1140 Type 2 diabetes mellitus with diabetic neuropathy, unspecified: Secondary | ICD-10-CM | POA: Diagnosis not present

## 2020-03-21 DIAGNOSIS — Z9889 Other specified postprocedural states: Secondary | ICD-10-CM | POA: Diagnosis not present

## 2020-03-21 DIAGNOSIS — Z794 Long term (current) use of insulin: Secondary | ICD-10-CM | POA: Diagnosis not present

## 2020-03-21 DIAGNOSIS — C50412 Malignant neoplasm of upper-outer quadrant of left female breast: Secondary | ICD-10-CM | POA: Diagnosis not present

## 2020-03-21 DIAGNOSIS — Z452 Encounter for adjustment and management of vascular access device: Secondary | ICD-10-CM | POA: Diagnosis not present

## 2020-03-21 DIAGNOSIS — G8918 Other acute postprocedural pain: Secondary | ICD-10-CM | POA: Diagnosis not present

## 2020-03-21 DIAGNOSIS — Z170 Estrogen receptor positive status [ER+]: Secondary | ICD-10-CM | POA: Diagnosis not present

## 2020-03-21 DIAGNOSIS — E119 Type 2 diabetes mellitus without complications: Secondary | ICD-10-CM | POA: Diagnosis not present

## 2020-03-22 DIAGNOSIS — Z794 Long term (current) use of insulin: Secondary | ICD-10-CM | POA: Diagnosis not present

## 2020-03-22 DIAGNOSIS — Z170 Estrogen receptor positive status [ER+]: Secondary | ICD-10-CM | POA: Diagnosis not present

## 2020-03-22 DIAGNOSIS — C50412 Malignant neoplasm of upper-outer quadrant of left female breast: Secondary | ICD-10-CM | POA: Diagnosis not present

## 2020-03-22 DIAGNOSIS — E119 Type 2 diabetes mellitus without complications: Secondary | ICD-10-CM | POA: Diagnosis not present

## 2020-03-22 DIAGNOSIS — Z9889 Other specified postprocedural states: Secondary | ICD-10-CM | POA: Diagnosis not present

## 2020-03-23 DIAGNOSIS — Z9889 Other specified postprocedural states: Secondary | ICD-10-CM | POA: Diagnosis not present

## 2020-03-23 DIAGNOSIS — C50412 Malignant neoplasm of upper-outer quadrant of left female breast: Secondary | ICD-10-CM | POA: Diagnosis not present

## 2020-03-23 DIAGNOSIS — E119 Type 2 diabetes mellitus without complications: Secondary | ICD-10-CM | POA: Diagnosis not present

## 2020-03-23 DIAGNOSIS — Z170 Estrogen receptor positive status [ER+]: Secondary | ICD-10-CM | POA: Diagnosis not present

## 2020-03-23 DIAGNOSIS — Z794 Long term (current) use of insulin: Secondary | ICD-10-CM | POA: Diagnosis not present

## 2020-03-30 DIAGNOSIS — G43709 Chronic migraine without aura, not intractable, without status migrainosus: Secondary | ICD-10-CM | POA: Diagnosis not present

## 2020-03-31 DIAGNOSIS — E669 Obesity, unspecified: Secondary | ICD-10-CM | POA: Diagnosis not present

## 2020-03-31 DIAGNOSIS — G43909 Migraine, unspecified, not intractable, without status migrainosus: Secondary | ICD-10-CM | POA: Diagnosis not present

## 2020-03-31 DIAGNOSIS — I10 Essential (primary) hypertension: Secondary | ICD-10-CM | POA: Diagnosis not present

## 2020-03-31 DIAGNOSIS — J4520 Mild intermittent asthma, uncomplicated: Secondary | ICD-10-CM | POA: Diagnosis not present

## 2020-03-31 DIAGNOSIS — E1140 Type 2 diabetes mellitus with diabetic neuropathy, unspecified: Secondary | ICD-10-CM | POA: Diagnosis not present

## 2020-03-31 DIAGNOSIS — Z794 Long term (current) use of insulin: Secondary | ICD-10-CM | POA: Diagnosis not present

## 2020-03-31 DIAGNOSIS — Z9012 Acquired absence of left breast and nipple: Secondary | ICD-10-CM | POA: Diagnosis not present

## 2020-03-31 DIAGNOSIS — R569 Unspecified convulsions: Secondary | ICD-10-CM | POA: Diagnosis not present

## 2020-03-31 DIAGNOSIS — C50812 Malignant neoplasm of overlapping sites of left female breast: Secondary | ICD-10-CM | POA: Diagnosis not present

## 2020-04-03 DIAGNOSIS — D649 Anemia, unspecified: Secondary | ICD-10-CM | POA: Diagnosis not present

## 2020-04-03 DIAGNOSIS — Z95828 Presence of other vascular implants and grafts: Secondary | ICD-10-CM | POA: Diagnosis not present

## 2020-04-03 DIAGNOSIS — Z9012 Acquired absence of left breast and nipple: Secondary | ICD-10-CM | POA: Diagnosis not present

## 2020-04-03 DIAGNOSIS — C50812 Malignant neoplasm of overlapping sites of left female breast: Secondary | ICD-10-CM | POA: Diagnosis not present

## 2020-04-03 DIAGNOSIS — C50412 Malignant neoplasm of upper-outer quadrant of left female breast: Secondary | ICD-10-CM | POA: Diagnosis not present

## 2020-04-03 DIAGNOSIS — Z9889 Other specified postprocedural states: Secondary | ICD-10-CM | POA: Diagnosis not present

## 2020-04-03 DIAGNOSIS — Z170 Estrogen receptor positive status [ER+]: Secondary | ICD-10-CM | POA: Diagnosis not present

## 2020-04-18 DIAGNOSIS — C50919 Malignant neoplasm of unspecified site of unspecified female breast: Secondary | ICD-10-CM | POA: Diagnosis not present

## 2020-04-18 DIAGNOSIS — C50412 Malignant neoplasm of upper-outer quadrant of left female breast: Secondary | ICD-10-CM | POA: Diagnosis not present

## 2020-04-18 DIAGNOSIS — C50812 Malignant neoplasm of overlapping sites of left female breast: Secondary | ICD-10-CM | POA: Diagnosis not present

## 2020-04-18 DIAGNOSIS — Z9012 Acquired absence of left breast and nipple: Secondary | ICD-10-CM | POA: Diagnosis not present

## 2020-04-18 DIAGNOSIS — C773 Secondary and unspecified malignant neoplasm of axilla and upper limb lymph nodes: Secondary | ICD-10-CM | POA: Diagnosis not present

## 2020-04-18 DIAGNOSIS — Z171 Estrogen receptor negative status [ER-]: Secondary | ICD-10-CM | POA: Diagnosis not present

## 2020-04-18 DIAGNOSIS — Z170 Estrogen receptor positive status [ER+]: Secondary | ICD-10-CM | POA: Diagnosis not present

## 2020-04-25 DIAGNOSIS — Z170 Estrogen receptor positive status [ER+]: Secondary | ICD-10-CM | POA: Diagnosis not present

## 2020-04-25 DIAGNOSIS — C773 Secondary and unspecified malignant neoplasm of axilla and upper limb lymph nodes: Secondary | ICD-10-CM | POA: Diagnosis not present

## 2020-04-25 DIAGNOSIS — Z79899 Other long term (current) drug therapy: Secondary | ICD-10-CM | POA: Diagnosis not present

## 2020-04-25 DIAGNOSIS — C50412 Malignant neoplasm of upper-outer quadrant of left female breast: Secondary | ICD-10-CM | POA: Diagnosis not present

## 2020-04-25 DIAGNOSIS — Z803 Family history of malignant neoplasm of breast: Secondary | ICD-10-CM | POA: Diagnosis not present

## 2020-04-25 DIAGNOSIS — Z9012 Acquired absence of left breast and nipple: Secondary | ICD-10-CM | POA: Diagnosis not present

## 2020-05-03 DIAGNOSIS — Z171 Estrogen receptor negative status [ER-]: Secondary | ICD-10-CM | POA: Diagnosis not present

## 2020-05-03 DIAGNOSIS — C50412 Malignant neoplasm of upper-outer quadrant of left female breast: Secondary | ICD-10-CM | POA: Diagnosis not present

## 2020-05-03 DIAGNOSIS — C50919 Malignant neoplasm of unspecified site of unspecified female breast: Secondary | ICD-10-CM | POA: Diagnosis not present

## 2020-05-03 DIAGNOSIS — Z170 Estrogen receptor positive status [ER+]: Secondary | ICD-10-CM | POA: Diagnosis not present

## 2020-05-09 DIAGNOSIS — C50912 Malignant neoplasm of unspecified site of left female breast: Secondary | ICD-10-CM | POA: Diagnosis not present

## 2020-05-10 DIAGNOSIS — G4733 Obstructive sleep apnea (adult) (pediatric): Secondary | ICD-10-CM | POA: Diagnosis not present

## 2020-05-10 DIAGNOSIS — R519 Headache, unspecified: Secondary | ICD-10-CM | POA: Diagnosis not present

## 2020-05-10 DIAGNOSIS — R569 Unspecified convulsions: Secondary | ICD-10-CM | POA: Diagnosis not present

## 2020-05-16 DIAGNOSIS — Z79899 Other long term (current) drug therapy: Secondary | ICD-10-CM | POA: Diagnosis not present

## 2020-05-16 DIAGNOSIS — C773 Secondary and unspecified malignant neoplasm of axilla and upper limb lymph nodes: Secondary | ICD-10-CM | POA: Diagnosis not present

## 2020-05-16 DIAGNOSIS — Z5111 Encounter for antineoplastic chemotherapy: Secondary | ICD-10-CM | POA: Diagnosis not present

## 2020-05-16 DIAGNOSIS — C50412 Malignant neoplasm of upper-outer quadrant of left female breast: Secondary | ICD-10-CM | POA: Diagnosis not present

## 2020-05-16 DIAGNOSIS — Z170 Estrogen receptor positive status [ER+]: Secondary | ICD-10-CM | POA: Diagnosis not present

## 2020-05-16 DIAGNOSIS — Z9012 Acquired absence of left breast and nipple: Secondary | ICD-10-CM | POA: Diagnosis not present

## 2020-05-16 DIAGNOSIS — Z803 Family history of malignant neoplasm of breast: Secondary | ICD-10-CM | POA: Diagnosis not present

## 2020-05-21 DIAGNOSIS — I517 Cardiomegaly: Secondary | ICD-10-CM | POA: Diagnosis not present

## 2020-05-21 DIAGNOSIS — R1111 Vomiting without nausea: Secondary | ICD-10-CM | POA: Diagnosis not present

## 2020-05-21 DIAGNOSIS — Y998 Other external cause status: Secondary | ICD-10-CM | POA: Diagnosis not present

## 2020-05-21 DIAGNOSIS — J9690 Respiratory failure, unspecified, unspecified whether with hypoxia or hypercapnia: Secondary | ICD-10-CM | POA: Diagnosis not present

## 2020-05-21 DIAGNOSIS — I619 Nontraumatic intracerebral hemorrhage, unspecified: Secondary | ICD-10-CM | POA: Diagnosis not present

## 2020-05-21 DIAGNOSIS — E1165 Type 2 diabetes mellitus with hyperglycemia: Secondary | ICD-10-CM | POA: Diagnosis not present

## 2020-05-21 DIAGNOSIS — X58XXXA Exposure to other specified factors, initial encounter: Secondary | ICD-10-CM | POA: Diagnosis not present

## 2020-05-21 DIAGNOSIS — I6200 Nontraumatic subdural hemorrhage, unspecified: Secondary | ICD-10-CM | POA: Diagnosis not present

## 2020-05-21 DIAGNOSIS — R404 Transient alteration of awareness: Secondary | ICD-10-CM | POA: Diagnosis not present

## 2020-05-21 DIAGNOSIS — R4182 Altered mental status, unspecified: Secondary | ICD-10-CM | POA: Diagnosis not present

## 2020-05-21 DIAGNOSIS — R001 Bradycardia, unspecified: Secondary | ICD-10-CM | POA: Diagnosis not present

## 2020-05-21 DIAGNOSIS — Z9911 Dependence on respirator [ventilator] status: Secondary | ICD-10-CM | POA: Diagnosis not present

## 2020-05-21 DIAGNOSIS — R9431 Abnormal electrocardiogram [ECG] [EKG]: Secondary | ICD-10-CM | POA: Diagnosis not present

## 2020-05-21 DIAGNOSIS — R4020 Unspecified coma: Secondary | ICD-10-CM | POA: Diagnosis not present

## 2020-05-21 DIAGNOSIS — S065X0A Traumatic subdural hemorrhage without loss of consciousness, initial encounter: Secondary | ICD-10-CM | POA: Diagnosis not present

## 2020-05-22 DIAGNOSIS — Z853 Personal history of malignant neoplasm of breast: Secondary | ICD-10-CM | POA: Diagnosis not present

## 2020-05-22 DIAGNOSIS — Z4682 Encounter for fitting and adjustment of non-vascular catheter: Secondary | ICD-10-CM | POA: Diagnosis not present

## 2020-05-22 DIAGNOSIS — G40909 Epilepsy, unspecified, not intractable, without status epilepticus: Secondary | ICD-10-CM | POA: Diagnosis not present

## 2020-05-22 DIAGNOSIS — I619 Nontraumatic intracerebral hemorrhage, unspecified: Secondary | ICD-10-CM | POA: Diagnosis not present

## 2020-05-22 DIAGNOSIS — R9431 Abnormal electrocardiogram [ECG] [EKG]: Secondary | ICD-10-CM | POA: Diagnosis not present

## 2020-05-22 DIAGNOSIS — Z515 Encounter for palliative care: Secondary | ICD-10-CM | POA: Diagnosis not present

## 2020-05-22 DIAGNOSIS — J9602 Acute respiratory failure with hypercapnia: Secondary | ICD-10-CM | POA: Diagnosis not present

## 2020-05-22 DIAGNOSIS — I629 Nontraumatic intracranial hemorrhage, unspecified: Secondary | ICD-10-CM | POA: Diagnosis not present

## 2020-05-22 DIAGNOSIS — I6200 Nontraumatic subdural hemorrhage, unspecified: Secondary | ICD-10-CM | POA: Diagnosis not present

## 2020-05-22 DIAGNOSIS — K5900 Constipation, unspecified: Secondary | ICD-10-CM | POA: Diagnosis not present

## 2020-05-22 DIAGNOSIS — R001 Bradycardia, unspecified: Secondary | ICD-10-CM | POA: Diagnosis not present

## 2020-05-22 DIAGNOSIS — J9601 Acute respiratory failure with hypoxia: Secondary | ICD-10-CM | POA: Diagnosis not present

## 2020-05-22 DIAGNOSIS — I639 Cerebral infarction, unspecified: Secondary | ICD-10-CM | POA: Diagnosis not present

## 2020-05-22 DIAGNOSIS — G935 Compression of brain: Secondary | ICD-10-CM | POA: Diagnosis not present

## 2020-05-22 DIAGNOSIS — Z66 Do not resuscitate: Secondary | ICD-10-CM | POA: Diagnosis not present

## 2020-05-22 DIAGNOSIS — Z781 Physical restraint status: Secondary | ICD-10-CM | POA: Diagnosis not present

## 2020-05-22 DIAGNOSIS — I10 Essential (primary) hypertension: Secondary | ICD-10-CM | POA: Diagnosis not present

## 2020-05-24 ENCOUNTER — Other Ambulatory Visit: Payer: Self-pay

## 2020-05-24 NOTE — Patient Outreach (Signed)
[facility]) Care Management  05/24/2020  [name] [name] August 29, 1945 [id number]   Referral Date: 05/24/20 Referral Source: [employer] Report Date of Discharge: 05/23/20 Facility:  [place]: [facility]   Referral received.  No outreach warranted at this time.  Patient deceased.     Plan: RN CM will close case.    Jone Baseman, RN, MSN [facility] Care Management Care Management Coordinator Direct Line [phone] Toll Free: [phone]  Fax: [phone]

## 2020-06-10 DEATH — deceased
